# Patient Record
Sex: Male | Born: 1935
Health system: Southern US, Community
[De-identification: ages and names within clinical notes are randomized; demographics above are authoritative.]

## PROBLEM LIST (undated history)

## (undated) DIAGNOSIS — H3530 Unspecified macular degeneration: Secondary | ICD-10-CM

## (undated) DIAGNOSIS — K219 Gastro-esophageal reflux disease without esophagitis: Secondary | ICD-10-CM

## (undated) DIAGNOSIS — E669 Obesity, unspecified: Secondary | ICD-10-CM

## (undated) DIAGNOSIS — M1990 Unspecified osteoarthritis, unspecified site: Secondary | ICD-10-CM

## (undated) DIAGNOSIS — R195 Other fecal abnormalities: Secondary | ICD-10-CM

## (undated) DIAGNOSIS — E785 Hyperlipidemia, unspecified: Secondary | ICD-10-CM

## (undated) DIAGNOSIS — L309 Dermatitis, unspecified: Secondary | ICD-10-CM

## (undated) DIAGNOSIS — D509 Iron deficiency anemia, unspecified: Secondary | ICD-10-CM

## (undated) DIAGNOSIS — M159 Polyosteoarthritis, unspecified: Secondary | ICD-10-CM

## (undated) DIAGNOSIS — H3560 Retinal hemorrhage, unspecified eye: Secondary | ICD-10-CM

## (undated) DIAGNOSIS — N182 Chronic kidney disease, stage 2 (mild): Secondary | ICD-10-CM

## (undated) DIAGNOSIS — R270 Ataxia, unspecified: Secondary | ICD-10-CM

## (undated) DIAGNOSIS — G4700 Insomnia, unspecified: Secondary | ICD-10-CM

## (undated) DIAGNOSIS — IMO0002 Reserved for concepts with insufficient information to code with codable children: Secondary | ICD-10-CM

## (undated) DIAGNOSIS — D649 Anemia, unspecified: Secondary | ICD-10-CM

## (undated) DIAGNOSIS — I872 Venous insufficiency (chronic) (peripheral): Secondary | ICD-10-CM

## (undated) DIAGNOSIS — I10 Essential (primary) hypertension: Secondary | ICD-10-CM

## (undated) HISTORY — DX: Other fecal abnormalities: R19.5

## (undated) HISTORY — DX: Hyperlipidemia, unspecified: E78.5

## (undated) HISTORY — DX: Polyosteoarthritis, unspecified: M15.9

## (undated) HISTORY — PX: OTHER SURGICAL HISTORY: SHX169

## (undated) HISTORY — DX: Gastro-esophageal reflux disease without esophagitis: K21.9

## (undated) HISTORY — DX: Insomnia, unspecified: G47.00

## (undated) HISTORY — DX: Iron deficiency anemia, unspecified: D50.9

## (undated) HISTORY — DX: Anemia, unspecified: D64.9

## (undated) HISTORY — DX: Essential (primary) hypertension: I10

## (undated) HISTORY — DX: Retinal hemorrhage, unspecified eye: H35.60

## (undated) HISTORY — DX: Chronic kidney disease, stage 2 (mild): N18.2

## (undated) HISTORY — DX: Unspecified macular degeneration: H35.30

## (undated) HISTORY — DX: Venous insufficiency (chronic) (peripheral): I87.2

## (undated) HISTORY — PX: NASAL SINUS SURGERY: SHX719

## (undated) HISTORY — DX: Ataxia, unspecified: R27.0

## (undated) HISTORY — DX: Unspecified osteoarthritis, unspecified site: M19.90

## (undated) HISTORY — DX: Reserved for concepts with insufficient information to code with codable children: IMO0002

## (undated) HISTORY — DX: Obesity, unspecified: E66.9

## (undated) HISTORY — DX: Dermatitis, unspecified: L30.9

---
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
## 2010-10-02 ENCOUNTER — Ambulatory Visit
Admission: RE | Admit: 2010-10-02 | Discharge: 2010-10-02 | Payer: Self-pay | Source: Home / Self Care | Attending: Family Medicine | Admitting: Family Medicine

## 2010-10-02 DIAGNOSIS — I10 Essential (primary) hypertension: Secondary | ICD-10-CM | POA: Insufficient documentation

## 2010-10-02 DIAGNOSIS — E785 Hyperlipidemia, unspecified: Secondary | ICD-10-CM | POA: Insufficient documentation

## 2010-10-03 ENCOUNTER — Telehealth: Payer: Self-pay | Admitting: Family Medicine

## 2010-10-08 ENCOUNTER — Encounter: Payer: Self-pay | Admitting: Family Medicine

## 2010-10-08 ENCOUNTER — Telehealth (INDEPENDENT_AMBULATORY_CARE_PROVIDER_SITE_OTHER): Payer: Self-pay | Admitting: *Deleted

## 2010-10-08 LAB — CONVERTED CEMR LAB
ALT: 13 U/L
AST: 15 U/L
Albumin: 4.3 g/dL
Alkaline Phosphatase: 82 U/L
BUN: 24 mg/dL — ABNORMAL HIGH
Basophils Absolute: 0 K/uL
Basophils Relative: 0 %
CO2: 24 meq/L
Calcium: 9.8 mg/dL
Chloride: 102 meq/L
Cholesterol: 195 mg/dL
Creatinine, Ser: 0.9 mg/dL
Eosinophils Absolute: 0.3 K/uL
Eosinophils Relative: 4 %
Glucose, Bld: 90 mg/dL
HCT: 39.8 %
HDL: 55 mg/dL
Hemoglobin: 13.1 g/dL
LDL Cholesterol: 115 mg/dL — ABNORMAL HIGH
Lymphocytes Relative: 19 %
Lymphs Abs: 1.5 K/uL
MCHC: 32.9 g/dL
MCV: 97.3 fL
Monocytes Absolute: 0.8 K/uL
Monocytes Relative: 10 %
Neutro Abs: 5 K/uL
Neutrophils Relative %: 66 %
PSA: 1.45 ng/mL
Platelets: 352 K/uL
Potassium: 4.4 meq/L
RBC: 4.09 M/uL — ABNORMAL LOW
RDW: 14.5 %
Sodium: 142 meq/L
TSH: 2.29 u[IU]/mL
Total Bilirubin: 0.4 mg/dL
Total CHOL/HDL Ratio: 3.5
Total Protein: 6.7 g/dL
Triglycerides: 126 mg/dL
VLDL: 25 mg/dL
WBC: 7.6 10*3/microliter

## 2010-10-09 ENCOUNTER — Telehealth: Payer: Self-pay | Admitting: Family Medicine

## 2010-11-01 NOTE — Progress Notes (Signed)
Summary: Medical Record Request  Phone Note Other Incoming   Summary of Call: I forgot to get him to sign release of records authorization.  Please get him to come by at his earliest convenience to sign one and also get him to give the name of his most recent primary care doctor and any contact info he has so we can request old records.  Thanks. Initial call taken by: Michell Heinrich M.D.,  October 03, 2010 11:37 AM  Follow-up for Phone Call        LM for pt to [facility] Diane Tomerlin  October 03, 2010 4:40 PM  Additional Follow-up for Phone Call Additional follow up Details #1::        noted Additional Follow-up by: Michell Heinrich M.D.,  October 04, 2010 8:11 AM    Additional Follow-up for Phone Call Additional follow up Details #2::    Request signed 10/02/10 by patient Follow-up by: Lannette Donath,  October 22, 2010 1:00 PM

## 2010-11-01 NOTE — Progress Notes (Signed)
Summary: lab results  Phone Note Outgoing Call   Summary of Call: Please notify: all labs were great!  Continue all current meds. Initial call taken by: Michell Heinrich M.D.,  October 09, 2010 8:11 AM  Follow-up for Phone Call        message left on cell notifying of above.  also advised that refills had [facility] to [facility] and to call with any problems with those refills. Follow-up by: Francee Piccolo CMA Duncan Dull),  October 09, 2010 3:59 PM
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
## 2010-11-01 NOTE — Progress Notes (Signed)
Summary: Refill request  Phone Note Refill Request Message from:  Fax from Pharmacy on October 08, 2010 11:14 AM  Refills Requested: Medication #1:  OMEPRAZOLE 20 MG CPDR Take 1 capsule by mouth once a day   Dosage confirmed as above?Dosage Confirmed   Supply Requested: 3 months  Medication #2:  VERAPAMIL HCL CR 240 MG CR-TABS Take 1 tablet by mouth two times a day   Dosage confirmed as above?Dosage Confirmed   Supply Requested: 1 month  Medication #3:  PRAVASTATIN SODIUM 40 MG TABS Take 1 tablet by mouth once a day   Dosage confirmed as above?Dosage Confirmed   Supply Requested: 3 months  Method Requested: Electronic Initial call taken by: Francee Piccolo CMA Duncan Dull),  October 08, 2010 11:15 AM  Follow-up for Phone Call        RF all 3 as requested. Follow-up by: Michell Heinrich M.D.,  October 08, 2010 12:09 PM    Prescriptions: PRAVASTATIN SODIUM 40 MG TABS (PRAVASTATIN SODIUM) Take 1 tablet by mouth once a day  #90 x 0   Entered by:   Francee Piccolo CMA (AAMA)   Authorized by:   Michell Heinrich M.D.   Signed by:   Francee Piccolo CMA (AAMA) on 10/08/2010   Method used:   [id number]       [address]       [place], [place]  [address]       Ph: [phone] or [phone]       Fax: [phone]   RxID:   [id number] VERAPAMIL HCL CR 240 MG CR-TABS (VERAPAMIL HCL) Take 1 tablet by mouth two times a day  #60 x 0   Entered by:   Francee Piccolo CMA (AAMA)   Authorized by:   Michell Heinrich M.D.   Signed by:   Francee Piccolo CMA (AAMA) on 10/08/2010   Method used:   [facility]        [id number]       [address]       [place], [place]  [address]       Ph: [phone] or [phone]       Fax: [phone]   RxID:   [id number] OMEPRAZOLE 20 MG CPDR (OMEPRAZOLE) Take 1 capsule by mouth once a day  #90 x 3   Entered by:   Francee Piccolo CMA (AAMA)   Authorized by:   Michell Heinrich M.D.   Signed by:   Francee Piccolo CMA (AAMA) on 10/08/2010   Method used:   [facility]        [facility]  [address] [phone]       [address]       [place], [place]  [address]       Ph: [phone] or [phone]       Fax: [phone]   RxID:   [id number]

## 2010-11-15 ENCOUNTER — Encounter: Payer: Self-pay | Admitting: Family Medicine

## 2010-11-21 NOTE — Miscellaneous (Signed)
  Clinical Lists Changes  Observations: Added new observation of PAST MED HX: HTN Hyperlipidemia Osteoarthritis L-spine (spinal stenosis, lumbar radiculopathy 2010) and knees. GERD, with peptic stricture (dilatation in 1993 and 1994). Asymmetric sensorineural hearing loss (noise exposure, R worse than L). Plantar fasciitis, right. (11/15/2010 16:14) Added new observation of DM PROGRESS: N/A (11/15/2010 16:14) Added new observation of DM FSREVIEW: N/A (11/15/2010 16:14) Added new observation of TD BOOSTER: Historical (10/01/2007 16:37) Added new observation of ZOSTAVAX: Zostavax (10/01/2007 16:37) Added new observation of COLONOSCOPY: normal (09/30/2000 16:25) Added new observation of PNEUMOVAX: given (09/30/1996 16:18)        Preventive Care Screening  Colonoscopy:    Date:  09/30/2000    Results:  normal  Last Pneumovax:    Date:  09/30/1996    Results:  given   Past History:  Past Medical History: HTN Hyperlipidemia Osteoarthritis L-spine (spinal stenosis, lumbar radiculopathy 2010) and knees. GERD, with peptic stricture (dilatation in 1993 and 1994). Asymmetric sensorineural hearing loss (noise exposure, R worse than L). Plantar fasciitis, right.   Prevention & Chronic Care Immunizations   Influenza vaccine: Not documented    Tetanus booster: 10/01/2007: Historical    Pneumococcal vaccine: given  (09/30/1996)    H. zoster vaccine: 10/01/2007: Zostavax  Colorectal Screening   Hemoccult: Not documented    Colonoscopy: normal  (09/30/2000)  Other Screening   PSA: 1.45  (10/08/2010)   Smoking status: never  (10/02/2010)  Lipids   Total Cholesterol: 195  (10/08/2010)   LDL: 115  (10/08/2010)   LDL Direct: Not documented   HDL: 55  (10/08/2010)   Triglycerides: 126  (10/08/2010)    SGOT (AST): 15  (10/08/2010)   SGPT (ALT): 13  (10/08/2010)   Alkaline phosphatase: 82  (10/08/2010)   Total bilirubin: 0.4  (10/08/2010)  Hypertension   Last  Blood Pressure: 123 / 68  (10/02/2010)   Serum creatinine: 0.90  (10/08/2010)   Serum potassium 4.4  (10/08/2010)  Self-Management Support :    Hypertension self-management support: Not documented    Lipid self-management support: Not documented    Immunization History:  Zostavax History:    Zostavax # 1:  zostavax (10/01/2007)  Tetanus/Td Immunization History:    Tetanus/Td:  historical (10/01/2007)

## 2010-12-06 ENCOUNTER — Encounter: Payer: Self-pay | Admitting: Family Medicine

## 2010-12-11 ENCOUNTER — Telehealth: Payer: Self-pay | Admitting: Family Medicine

## 2010-12-18 NOTE — Progress Notes (Signed)
Summary: Verapamil refill  Phone Note Refill Request Message from:  Fax from Pharmacy on December 11, 2010 9:35 AM  Refills Requested: Medication #1:  VERAPAMIL HCL CR 240 MG CR-TABS Take 1 tablet by mouth two times a day   Dosage confirmed as above?Dosage Confirmed   Brand Name Necessary? No   Supply Requested: 1 month  Method Requested: Electronic Initial call taken by: Lannette Donath,  December 11, 2010 9:35 AM  Follow-up for Phone Call        May RF x 4. Follow-up by: Michell Heinrich M.D.,  December 11, 2010 10:12 AM  Additional Follow-up for Phone Call Additional follow up Details #1::        rx sent. Additional Follow-up by: Francee Piccolo CMA Duncan Dull),  December 11, 2010 11:02 AM    Prescriptions: VERAPAMIL HCL CR 240 MG CR-TABS (VERAPAMIL HCL) Take 1 tablet by mouth two times a day  #60 x 3   Entered by:   Francee Piccolo CMA (AAMA)   Authorized by:   Michell Heinrich M.D.   Signed by:   Francee Piccolo CMA (AAMA) on 12/11/2010   Method used:   [facility]  [address] [phone][facility]       [address]       [place], [place]  [address]       Ph: [phone] or [phone]       Fax: [phone]   RxID:   [id number]

## 2011-01-03 ENCOUNTER — Telehealth: Payer: Self-pay | Admitting: Family Medicine

## 2011-01-03 NOTE — Telephone Encounter (Signed)
Authorized fax request for RF of pravastatin today, #90, RF x 4. Pt. Due for routine f/u approx 03/2011.--PM

## 2011-02-04 ENCOUNTER — Other Ambulatory Visit: Payer: Self-pay | Admitting: *Deleted

## 2011-02-04 DIAGNOSIS — I10 Essential (primary) hypertension: Secondary | ICD-10-CM

## 2011-02-04 MED ORDER — LOSARTAN POTASSIUM-HCTZ 100-12.5 MG PO TABS: 1.0000 | ORAL_TABLET | Freq: Every day | ORAL | Status: DC

## 2011-02-04 NOTE — Telephone Encounter (Signed)
Pt presents at office requesting refill on losartan.  Refill sent.  Pt due for follow up appt in 03/2011.

## 2011-03-12 ENCOUNTER — Ambulatory Visit (INDEPENDENT_AMBULATORY_CARE_PROVIDER_SITE_OTHER): Payer: PRIVATE HEALTH INSURANCE | Admitting: Family Medicine

## 2011-03-12 ENCOUNTER — Encounter: Payer: Self-pay | Admitting: Family Medicine

## 2011-03-12 DIAGNOSIS — E785 Hyperlipidemia, unspecified: Secondary | ICD-10-CM

## 2011-03-12 DIAGNOSIS — Z0000 Encounter for general adult medical examination without abnormal findings: Secondary | ICD-10-CM

## 2011-03-12 DIAGNOSIS — L989 Disorder of the skin and subcutaneous tissue, unspecified: Secondary | ICD-10-CM

## 2011-03-12 DIAGNOSIS — I10 Essential (primary) hypertension: Secondary | ICD-10-CM

## 2011-03-12 LAB — COMPREHENSIVE METABOLIC PANEL
AST: 18 U/L (ref 0–37)
Albumin: 4.4 g/dL (ref 3.5–5.2)
BUN: 22 mg/dL (ref 6–23)
Calcium: 9 mg/dL (ref 8.4–10.5)
Chloride: 102 mEq/L (ref 96–112)
Glucose, Bld: 79 mg/dL (ref 70–99)
Potassium: 4.5 mEq/L (ref 3.5–5.1)
Total Protein: 7.6 g/dL (ref 6.0–8.3)

## 2011-03-12 LAB — LIPID PANEL
Total CHOL/HDL Ratio: 3
Triglycerides: 124 mg/dL (ref 0.0–149.0)

## 2011-03-12 NOTE — Assessment & Plan Note (Signed)
Check FLP, CMET today. Encouraged him to be more physically active.  He'll try NSAIDs for knees/thumbs since it has worked excellently for his back. He has declined further screening for colon cancer.  PSA 97mo ago was normal.
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
## 2011-03-12 NOTE — Assessment & Plan Note (Signed)
Problem stable.  Continue current medications and diet appropriate for this condition.  We have reviewed our general long term plan for this problem and also reviewed symptoms and signs that should prompt the patient to call or return to the office. Check FLP today. 

## 2011-03-12 NOTE — Assessment & Plan Note (Signed)
Problem stable.  Continue current medications and diet appropriate for this condition.  We have reviewed our general long term plan for this problem and also reviewed symptoms and signs that should prompt the patient to call or return to the office.  

## 2011-04-01 ENCOUNTER — Other Ambulatory Visit: Payer: Self-pay | Admitting: *Deleted

## 2011-04-01 DIAGNOSIS — I10 Essential (primary) hypertension: Secondary | ICD-10-CM

## 2011-04-01 MED ORDER — LOSARTAN POTASSIUM-HCTZ 100-12.5 MG PO TABS: 1.0000 | ORAL_TABLET | Freq: Every day | ORAL | Status: DC

## 2011-04-01 NOTE — Telephone Encounter (Signed)
Faxed refill request received.  Pt last seen 03/2011 and told to continue all meds.  Pt should follow up in 08/2011.  RX through December sent.

## 2011-04-15 ENCOUNTER — Other Ambulatory Visit: Payer: Self-pay

## 2011-04-15 MED ORDER — VERAPAMIL HCL 240 MG PO TBCR: 240.0000 mg | EXTENDED_RELEASE_TABLET | Freq: Two times a day (BID) | ORAL | Status: DC

## 2011-05-15 ENCOUNTER — Other Ambulatory Visit: Payer: Self-pay | Admitting: Family Medicine

## 2011-05-15 NOTE — Telephone Encounter (Signed)
Last OV 03/12/11, next office visit due 08/2011.  RX sent through that time.

## 2011-07-04 ENCOUNTER — Ambulatory Visit (INDEPENDENT_AMBULATORY_CARE_PROVIDER_SITE_OTHER): Payer: PRIVATE HEALTH INSURANCE

## 2011-07-04 DIAGNOSIS — Z23 Encounter for immunization: Secondary | ICD-10-CM

## 2011-09-11 ENCOUNTER — Ambulatory Visit (INDEPENDENT_AMBULATORY_CARE_PROVIDER_SITE_OTHER): Payer: Medicare Other | Admitting: Family Medicine

## 2011-09-11 ENCOUNTER — Encounter: Payer: Self-pay | Admitting: Family Medicine

## 2011-09-11 VITALS — BP 118/72 | HR 54 | Temp 97.2°F | Wt 210.0 lb

## 2011-09-11 DIAGNOSIS — I10 Essential (primary) hypertension: Secondary | ICD-10-CM

## 2011-09-11 DIAGNOSIS — M2350 Chronic instability of knee, unspecified knee: Secondary | ICD-10-CM

## 2011-09-11 DIAGNOSIS — M238X9 Other internal derangements of unspecified knee: Secondary | ICD-10-CM

## 2011-09-11 DIAGNOSIS — E785 Hyperlipidemia, unspecified: Secondary | ICD-10-CM

## 2011-09-11 LAB — COMPREHENSIVE METABOLIC PANEL
AST: 19 U/L (ref 0–37)
Alkaline Phosphatase: 104 U/L (ref 39–117)
BUN: 20 mg/dL (ref 6–23)
Creatinine, Ser: 1 mg/dL (ref 0.4–1.5)
Potassium: 4.4 mEq/L (ref 3.5–5.1)
Total Bilirubin: 0.7 mg/dL (ref 0.3–1.2)

## 2011-09-11 LAB — LIPID PANEL
HDL: 56.5 mg/dL (ref >39.00)
Triglycerides: 182 mg/dL — ABNORMAL HIGH (ref 0.0–149.0)
VLDL: 36.4 mg/dL (ref 0.0–40.0)

## 2011-09-11 NOTE — Progress Notes (Signed)
OFFICE NOTE  09/12/2011  CC:  Chief Complaint  Patient presents with  . Follow-up    6 month follow up     HPI:   Patient is a 77 y.o. Caucasian male who is here for 31mo f/u HTN, hyperlipidemia, GERD. Compliant with meds.  No side effects. Only c/o he has is a feeling he's had for years of feeling "wobbly" when he walks--feels like the instability is focused in his knees.  No pain in knees. No numbness, tingling, or burning pain in LE's. NO headaches, dizziness, or tremulousness.  Unfortunately, his wife passed away from brain cancer about 2 wks ago.  He seems to be grieving appropriately, although he is definitely on the stoic side. Has son and daughter in law for support locally.  Pertinent PMH:  Past Medical History  Diagnosis Date  . Hypertension   . Hyperlipidemia   . GERD (gastroesophageal reflux disease)   . DDD (degenerative disc disease)     L-spine   Past Surgical History  Procedure Date  . Appendectomy 1935  . Tonsillectomy 1935   Past family and social history reviewed and the only new issue is his wife died 09-06-11 of brain cancer.  MEDS;   Outpatient Prescriptions Prior to Visit  Medication Sig Dispense Refill  . aspirin 81 MG tablet Take 81 mg by mouth daily.        . Cholecalciferol (VITAMIN D3) 2000 UNITS TABS Take by mouth daily.        Marland Kitchen losartan-hydrochlorothiazide (HYZAAR) 100-12.5 MG per tablet Take 1 tablet by mouth daily.  30 tablet  5  . multivitamin (THERAGRAN) per tablet Take 1 tablet by mouth daily.        . naproxen sodium (ANAPROX) 220 MG tablet Take 220 mg by mouth daily as needed.        Marland Kitchen omeprazole (PRILOSEC) 20 MG capsule Take 20 mg by mouth daily.        . pravastatin (PRAVACHOL) 40 MG tablet Take 40 mg by mouth daily.        . verapamil (CALAN-SR) 240 MG CR tablet TAKE 1 TABLET BY MOUTH 2 TIMES A DAY  60 tablet  4  . Glucosamine-Chondroitin-Vit D3 1500-1200-800 MG-MG-UNIT PACK Take 1 tablet by mouth daily.          PE: Blood  pressure 118/72, pulse 54, temperature 97.2 F (36.2 C), temperature source Temporal, weight 210 lb (95.255 kg), SpO2 96.00%. Gen: Alert, well appearing.  Patient is oriented to person, place, time, and situation. ENT:  Eyes: no injection, icteris, swelling, or exudate.  EOMI, PERRLA. Nose: no drainage or turbinate edema/swelling.  No injection or focal lesion.  Mouth: lips without lesion/swelling.  Oral mucosa pink and moist.  Oropharynx without erythema, exudate, or swelling.  Neck - No masses or thyromegaly or limitation in range of motion.  No bruit.  Carotids 1+ bilat. CV: RRR, no m/r/g.   LUNGS: CTA bilat, nonlabored resps, good aeration in all lung fields. EXT: 1+ edema bilat, with extensive hyperkeratosis and bronzing from chronic venous stasis edema/dermatitis.  No erythema or tenderness. Neuro: CN 2-12 intact bilaterally, strength 5/5 in proximal and distal upper extremities and lower extremities bilaterally.  No sensory deficits.  No tremor.  No disdiadochokinesis.  No ataxia.  Upper extremity and lower extremity DTRs symmetric.  No pronator drift. He walks normally but occasionally looks a little off balance and has to correct himself.  IMPRESSION AND PLAN:  HYPERLIPIDEMIA Problem stable.  Continue current medications  and diet appropriate for this condition.  We have reviewed our general long term plan for this problem and also reviewed symptoms and signs that should prompt the patient to call or return to the office. FLP today.  ESSENTIAL HYPERTENSION, BENIGN Problem stable.  Continue current medications and diet appropriate for this condition.  We have reviewed our general long term plan for this problem and also reviewed symptoms and signs that should prompt the patient to call or return to the office. CMET today.  Chronic knee instability Could be ligamentous or tendonous laxity that is giving him this feeling of instability in knees when he walks. No sign of neurologic  dysfunction.   He has been to Dr. Corinna Capra, an orthopedist in [place] for trigger finger in the past.  He plans on going back to him for trigger finger and to get knees assessed.   GERD: stable as long as he takes PPI daily.  Grief rxn: lost his wife to brain cancer last month.  Seems to be doing ok with this. Encouraged pt to call or return if he needed [place].  FOLLOW UP:  Return in about 6 months (around 03/11/2012) for CPE (30 min appt).

## 2011-09-12 DIAGNOSIS — M2350 Chronic instability of knee, unspecified knee: Secondary | ICD-10-CM | POA: Insufficient documentation

## 2011-09-12 LAB — LDL CHOLESTEROL, DIRECT: Direct LDL: 112.5 mg/dL

## 2011-09-12 NOTE — Assessment & Plan Note (Addendum)
Problem stable.  Continue current medications and diet appropriate for this condition.  We have reviewed our general long term plan for this problem and also reviewed symptoms and signs that should prompt the patient to call or return to the office. FLP today.

## 2011-09-12 NOTE — Assessment & Plan Note (Signed)
Could be ligamentous or tendonous laxity that is giving him this feeling of instability in knees when he walks. No sign of neurologic dysfunction.

## 2011-09-12 NOTE — Assessment & Plan Note (Signed)
Problem stable.  Continue current medications and diet appropriate for this condition.  We have reviewed our general long term plan for this problem and also reviewed symptoms and signs that should prompt the patient to call or return to the office. CMET today. 

## 2011-09-13 ENCOUNTER — Other Ambulatory Visit: Payer: Self-pay | Admitting: Family Medicine

## 2011-09-13 MED ORDER — PRAVASTATIN SODIUM 80 MG PO TABS: 80.0000 mg | ORAL_TABLET | Freq: Every day | ORAL | Status: DC

## 2011-09-13 NOTE — Progress Notes (Signed)
I can't find the 30mg  generic pravastatin in [facility], so I want him to take two of his 40mg  pravastatins daily until he runs out.  Then pick up new rx for 80mg  tab that he'll take once daily. Thx

## 2011-09-16 NOTE — Progress Notes (Signed)
i tried to call [facility] on his cell, but went to voicemail.  Daughter-in-law Rene Kocher called with information.  She will give [facility] message and make sure he is taking medication correctly.

## 2011-10-02 ENCOUNTER — Other Ambulatory Visit: Payer: Self-pay | Admitting: *Deleted

## 2011-10-02 DIAGNOSIS — I10 Essential (primary) hypertension: Secondary | ICD-10-CM

## 2011-10-02 MED ORDER — LOSARTAN POTASSIUM-HCTZ 100-12.5 MG PO TABS: 1.0000 | ORAL_TABLET | Freq: Every day | ORAL | Status: DC

## 2011-10-02 NOTE — Telephone Encounter (Signed)
Last seen 09/11/11.  Pt has follow up-CPE- on 03/10/12.  RX sent through that time.

## 2011-10-29 ENCOUNTER — Other Ambulatory Visit: Payer: Self-pay | Admitting: *Deleted

## 2011-10-29 MED ORDER — VERAPAMIL HCL ER 240 MG PO TBCR: 240.0000 mg | EXTENDED_RELEASE_TABLET | Freq: Two times a day (BID) | ORAL | Status: DC

## 2011-11-19 ENCOUNTER — Other Ambulatory Visit: Payer: Self-pay | Admitting: *Deleted

## 2011-11-19 MED ORDER — OMEPRAZOLE 20 MG PO CPDR: 20.0000 mg | DELAYED_RELEASE_CAPSULE | Freq: Every day | ORAL | Status: DC

## 2011-11-19 NOTE — Telephone Encounter (Signed)
Pt last seen 12/12.  RX sent until 12/13.

## 2011-12-19 DIAGNOSIS — Z7982 Long term (current) use of aspirin: Secondary | ICD-10-CM | POA: Diagnosis not present

## 2011-12-19 DIAGNOSIS — M6530 Trigger finger, unspecified finger: Secondary | ICD-10-CM | POA: Diagnosis not present

## 2011-12-19 DIAGNOSIS — Z79899 Other long term (current) drug therapy: Secondary | ICD-10-CM | POA: Diagnosis not present

## 2011-12-19 DIAGNOSIS — E7800 Pure hypercholesterolemia, unspecified: Secondary | ICD-10-CM | POA: Diagnosis not present

## 2011-12-19 DIAGNOSIS — K219 Gastro-esophageal reflux disease without esophagitis: Secondary | ICD-10-CM | POA: Diagnosis not present

## 2011-12-19 DIAGNOSIS — I10 Essential (primary) hypertension: Secondary | ICD-10-CM | POA: Diagnosis not present

## 2012-01-02 ENCOUNTER — Other Ambulatory Visit: Payer: Self-pay | Admitting: *Deleted

## 2012-01-02 DIAGNOSIS — I10 Essential (primary) hypertension: Secondary | ICD-10-CM

## 2012-01-02 MED ORDER — VERAPAMIL HCL ER 240 MG PO TBCR: 240.0000 mg | EXTENDED_RELEASE_TABLET | Freq: Two times a day (BID) | ORAL | Status: DC

## 2012-01-02 MED ORDER — OMEPRAZOLE 20 MG PO CPDR: 20.0000 mg | DELAYED_RELEASE_CAPSULE | Freq: Every day | ORAL | Status: DC

## 2012-01-02 MED ORDER — LOSARTAN POTASSIUM-HCTZ 100-12.5 MG PO TABS: 1.0000 | ORAL_TABLET | Freq: Every day | ORAL | Status: DC

## 2012-01-02 MED ORDER — PRAVASTATIN SODIUM 80 MG PO TABS: 80.0000 mg | ORAL_TABLET | Freq: Every day | ORAL | Status: DC

## 2012-01-02 NOTE — Telephone Encounter (Signed)
Faxed refill request received from pharmacy for pravastatin Last filled by MD on 09/12/12  Faxed refill request received from pharmacy for losartan-HCTZ Last filled by MD on 10/02/11, 30 x 6  Faxed refill request received from pharmacy for verapamil Last filled by MD on 10/29/11, 60 x 4   Faxed refill request received from pharmacy for omeprazole Last filled by MD on 11/19/11, 30 x 10  Last seen on 09/11/11 Follow up 03/10/12 for CPE RX sent.

## 2012-01-24 DIAGNOSIS — M4802 Spinal stenosis, cervical region: Secondary | ICD-10-CM | POA: Diagnosis not present

## 2012-02-29 DIAGNOSIS — R195 Other fecal abnormalities: Secondary | ICD-10-CM

## 2012-02-29 HISTORY — DX: Other fecal abnormalities: R19.5

## 2012-03-02 DIAGNOSIS — M1710 Unilateral primary osteoarthritis, unspecified knee: Secondary | ICD-10-CM | POA: Diagnosis not present

## 2012-03-02 DIAGNOSIS — M25569 Pain in unspecified knee: Secondary | ICD-10-CM | POA: Diagnosis not present

## 2012-03-10 ENCOUNTER — Ambulatory Visit (INDEPENDENT_AMBULATORY_CARE_PROVIDER_SITE_OTHER): Payer: Medicare Other | Admitting: Family Medicine

## 2012-03-10 ENCOUNTER — Encounter: Payer: Self-pay | Admitting: Family Medicine

## 2012-03-10 VITALS — BP 157/78 | HR 59 | Temp 97.2°F | Ht 69.25 in | Wt 210.0 lb

## 2012-03-10 DIAGNOSIS — I10 Essential (primary) hypertension: Secondary | ICD-10-CM | POA: Diagnosis not present

## 2012-03-10 DIAGNOSIS — M2350 Chronic instability of knee, unspecified knee: Secondary | ICD-10-CM

## 2012-03-10 DIAGNOSIS — M238X9 Other internal derangements of unspecified knee: Secondary | ICD-10-CM

## 2012-03-10 DIAGNOSIS — Z0000 Encounter for general adult medical examination without abnormal findings: Secondary | ICD-10-CM

## 2012-03-10 LAB — CBC WITH DIFFERENTIAL/PLATELET
Basophils Relative: 0.7 % (ref 0.0–3.0)
Eosinophils Relative: 5.8 % — ABNORMAL HIGH (ref 0.0–5.0)
Hemoglobin: 12.7 g/dL — ABNORMAL LOW (ref 13.0–17.0)
Lymphocytes Relative: 19.7 % (ref 12.0–46.0)
MCV: 96.8 fl (ref 78.0–100.0)
Neutro Abs: 4.2 10*3/uL (ref 1.4–7.7)
Neutrophils Relative %: 62.9 % (ref 43.0–77.0)
RBC: 4.01 Mil/uL — ABNORMAL LOW (ref 4.22–5.81)
WBC: 6.7 10*3/uL (ref 4.5–10.5)

## 2012-03-10 LAB — COMPREHENSIVE METABOLIC PANEL
AST: 18 U/L (ref 0–37)
Alkaline Phosphatase: 97 U/L (ref 39–117)
BUN: 18 mg/dL (ref 6–23)
Calcium: 9.1 mg/dL (ref 8.4–10.5)
Creatinine, Ser: 0.9 mg/dL (ref 0.4–1.5)
Total Bilirubin: 0.1 mg/dL — ABNORMAL LOW (ref 0.3–1.2)

## 2012-03-10 LAB — LIPID PANEL
Cholesterol: 157 mg/dL (ref 0–200)
HDL: 54.4 mg/dL (ref >39.00)
LDL Cholesterol: 80 mg/dL (ref 0–99)
Triglycerides: 114 mg/dL (ref 0.0–149.0)
VLDL: 22.8 mg/dL (ref 0.0–40.0)

## 2012-03-10 MED ORDER — LOSARTAN POTASSIUM-HCTZ 100-25 MG PO TABS: 1.0000 | ORAL_TABLET | Freq: Every day | ORAL | Status: DC

## 2012-03-10 NOTE — Progress Notes (Signed)
Office Note 03/10/2012  CC:  Chief Complaint  Patient presents with  . Annual Exam    left knee pain    HPI:  Glen Barnett is a 78 y.o. White male who is here for routine health maintenance exam. No acute complaints.  Says home bp monitoring show mild elevation of systolic consistently, diastolic normal.  Has some intermittent low back, hands, and knees arthralgias, occ takes naproxen, went to ortho recently and was told x-rays showed DJD. He gets regular dental visits. He has no vision complaints but has appt set up for routine eye exam soon. Denies having any significant excessive grieving or depression since his wife died of cancer last year.   Past Medical History  Diagnosis Date  . Hypertension   . Hyperlipidemia   . GERD (gastroesophageal reflux disease)   . DDD (degenerative disc disease)     L-spine    Past Surgical History  Procedure Date  . Appendectomy 1935  . Tonsillectomy 1935    Family History  Problem Relation Age of Onset  . Heart disease Neg Hx     History   Social History  . Marital Status: Widowed    Spouse Name: N/A    Number of Children: N/A  . Years of Education: N/A   Occupational History  . Not on file.   Social History Main Topics  . Smoking status: Never Smoker   . Smokeless tobacco: Never Used  . Alcohol Use: No  . Drug Use: No  . Sexually Active: Not on file   Other Topics Concern  . Not on file   Social History Narrative   Was married.  Wife died September 12, 2011 of brain cancer.He relocated from [place] 2010 to live near son.NO exercise.  No T/A/Ds.Retired Art gallery manager.    Outpatient Prescriptions Prior to Visit  Medication Sig Dispense Refill  . aspirin 81 MG tablet Take 81 mg by mouth daily.        . Cholecalciferol (VITAMIN D3) 2000 UNITS TABS Take by mouth daily.        . multivitamin (THERAGRAN) per tablet Take 1 tablet by mouth daily.        . naproxen sodium (ANAPROX) 220 MG tablet Take 220 mg by mouth daily as  needed.        Marland Kitchen omeprazole (PRILOSEC) 20 MG capsule Take 1 capsule (20 mg total) by mouth daily.  90 capsule  3  . pravastatin (PRAVACHOL) 80 MG tablet Take 1 tablet (80 mg total) by mouth daily.  90 tablet  0  . verapamil (CALAN-SR) 240 MG CR tablet Take 1 tablet (240 mg total) by mouth 2 (two) times daily.  180 tablet  0  . losartan-hydrochlorothiazide (HYZAAR) 100-12.5 MG per tablet Take 1 tablet by mouth daily.  90 tablet  0    No Known Allergies  ROS Review of Systems  Constitutional: Negative for fever, chills, appetite change and fatigue.  HENT: Negative for ear pain, congestion, sore throat, neck stiffness and dental problem.   Eyes: Negative for discharge, redness and visual disturbance.  Respiratory: Negative for cough, chest tightness, shortness of breath and wheezing.   Cardiovascular: Negative for chest pain, palpitations and leg swelling.  Gastrointestinal: Negative for nausea, vomiting, abdominal pain, diarrhea and blood in stool.  Genitourinary: Negative for dysuria, urgency, frequency, hematuria, flank pain and difficulty urinating.  Musculoskeletal: Positive for arthralgias (see HPI). Negative for myalgias, back pain and joint swelling.  Skin: Negative for pallor and rash.  Neurological: Negative for  dizziness, speech difficulty, weakness and headaches.  Hematological: Negative for adenopathy. Does not bruise/bleed easily.  Psychiatric/Behavioral: Negative for confusion and sleep disturbance. The patient is not nervous/anxious.     PE; Blood pressure 157/78, pulse 59, temperature 97.2 F (36.2 C), temperature source Temporal, height 5' 9.25" (1.759 m), weight 210 lb (95.255 kg), SpO2 95.00%. Gen: Alert, well appearing.  Patient is oriented to person, place, time, and situation. Affect: pleasant.  Thought and speech are lucid. ENT: Ears: EACs clear, normal epithelium.  TMs with good light reflex and landmarks bilaterally.  Eyes: no injection, icteris, swelling, or  exudate.  EOMI, PERRLA. Nose: no drainage or turbinate edema/swelling.  No injection or focal lesion.  Mouth: lips without lesion/swelling.  Oral mucosa pink and moist.  Dentition intact and without obvious caries or gingival swelling.  Oropharynx without erythema, exudate, or swelling.  Neck: supple/nontender.  No LAD, mass, or TM.  Carotid pulses 2+ bilaterally, without bruits. CV: RRR, no m/r/g.   LUNGS: CTA bilat, nonlabored resps, good aeration in all lung fields. ABD: soft, NT, ND, BS normal.  No hepatospenomegaly or mass.  No bruits. EXT: no clubbing, cyanosis, or edema.  Neuro: CN 2-12 intact bilaterally, strength 5/5 in proximal and distal upper extremities and lower extremities bilaterally.  No sensory deficits.  No tremor.  No disdiadochokinesis.  No ataxia.  Upper extremity and lower extremity DTRs symmetric.  No pronator drift.  Pertinent labs:  None today  ASSESSMENT AND PLAN:   Health maintenance examination Reviewed age and gender appropriate health maintenance issues (prudent diet, regular exercise, health risks of tobacco and excessive alcohol, use of seatbelts, fire alarms in home, use of sunscreen).  Also reviewed age and gender appropriate health screening as well as vaccine recommendations. We discussed colon cancer screening and prostate cancer screening and decided not to pursue these anymore due to pt age. CBC, CMET, TSH, and FLP. Encouraged him to be more active, but pt is perfectly happy being sedentary. Encouraged him to call or return if he feels like depression is becoming an issue regarding the death of his wife 2012/09/25.  Chronic knee instability Being appropriately addressed by ortho, starts PT tomorrow. At this point he's not taking NSAIDs much, but if he does I told him I prefer him to be on celebrex rather than ibup or naproxen.  ESSENTIAL HYPERTENSION, BENIGN Not ideal control. He'll finish his current bottle of hyzaar 100/12.5 and then fill the rx I  sent to pharmacy today for hyzaar 100/25. Continue to monitor home bp.  Reviewed goal of 140/90.     FOLLOW UP:  Return in about 2 months (around 05/10/2012) for f/u HTN.

## 2012-03-10 NOTE — Assessment & Plan Note (Signed)
Reviewed age and gender appropriate health maintenance issues (prudent diet, regular exercise, health risks of tobacco and excessive alcohol, use of seatbelts, fire alarms in home, use of sunscreen).  Also reviewed age and gender appropriate health screening as well as vaccine recommendations. We discussed colon cancer screening and prostate cancer screening and decided not to pursue these anymore due to pt age. CBC, CMET, TSH, and FLP. Encouraged him to be more active, but pt is perfectly happy being sedentary. Encouraged him to call or return if he feels like depression is becoming an issue regarding the death of his wife September 22, 2012.

## 2012-03-10 NOTE — Assessment & Plan Note (Addendum)
Being appropriately addressed by ortho, starts PT tomorrow. At this point he's not taking NSAIDs much, but if he does I told him I prefer him to be on celebrex rather than ibup or naproxen.

## 2012-03-10 NOTE — Assessment & Plan Note (Signed)
Not ideal control. He'll finish his current bottle of hyzaar 100/12.5 and then fill the rx I sent to pharmacy today for hyzaar 100/25. Continue to monitor home bp.  Reviewed goal of 140/90.

## 2012-03-11 ENCOUNTER — Other Ambulatory Visit: Payer: Self-pay | Admitting: Family Medicine

## 2012-03-11 DIAGNOSIS — D509 Iron deficiency anemia, unspecified: Secondary | ICD-10-CM

## 2012-03-11 DIAGNOSIS — M25569 Pain in unspecified knee: Secondary | ICD-10-CM | POA: Diagnosis not present

## 2012-03-11 LAB — VITAMIN B12: Vitamin B-12: 367 pg/mL (ref 211–911)

## 2012-03-11 LAB — IBC PANEL
Iron: 70 ug/dL (ref 42–165)
Transferrin: 297.3 mg/dL (ref 212.0–360.0)

## 2012-03-13 ENCOUNTER — Encounter: Payer: Self-pay | Admitting: Gastroenterology

## 2012-03-13 ENCOUNTER — Encounter: Payer: Self-pay | Admitting: Family Medicine

## 2012-03-24 ENCOUNTER — Other Ambulatory Visit: Payer: Medicare Other

## 2012-03-24 LAB — HEMOCCULT SLIDES (X 3 CARDS)
OCCULT 1: POSITIVE
OCCULT 5: NEGATIVE

## 2012-03-25 DIAGNOSIS — M25569 Pain in unspecified knee: Secondary | ICD-10-CM | POA: Diagnosis not present

## 2012-04-03 ENCOUNTER — Encounter: Payer: Self-pay | Admitting: Gastroenterology

## 2012-04-03 ENCOUNTER — Ambulatory Visit (INDEPENDENT_AMBULATORY_CARE_PROVIDER_SITE_OTHER): Payer: Medicare Other | Admitting: Gastroenterology

## 2012-04-03 VITALS — BP 142/82 | HR 86 | Ht 69.0 in | Wt 212.0 lb

## 2012-04-03 DIAGNOSIS — R195 Other fecal abnormalities: Secondary | ICD-10-CM | POA: Diagnosis not present

## 2012-04-03 DIAGNOSIS — D649 Anemia, unspecified: Secondary | ICD-10-CM | POA: Diagnosis not present

## 2012-04-03 MED ORDER — MOVIPREP 100 G PO SOLR: 1.0000 | ORAL | Status: DC

## 2012-04-03 NOTE — Progress Notes (Signed)
HPI: This is a   very pleasant 78 year old man whom I am meeting for the first time today.  He never sees overt bleeding,  No bowel  Issues, changes.  Colonoscopy 15 years ago, normal.  No colon cancer in family.  No abd pains.  Overall stable weight.  Hb was 12.8, MCV normal.  Ferritin slightly low.   Heme + 3 out of 6.    Review of systems: Pertinent positive and negative review of systems were noted in the above HPI section. Complete review of systems was performed and was otherwise normal.    Past Medical History  Diagnosis Date  . Hypertension   . Hyperlipidemia   . GERD (gastroesophageal reflux disease)   . DDD (degenerative disc disease)     L-spine  . Arthritis     Past Surgical History  Procedure Date  . Appendectomy 1935  . Tonsillectomy 1935    Current Outpatient Prescriptions  Medication Sig Dispense Refill  . aspirin 81 MG tablet Take 81 mg by mouth daily.        . Cholecalciferol (VITAMIN D3) 2000 UNITS TABS Take by mouth daily.        . ferrous sulfate (QC FERROUS SULFATE) 325 (65 FE) MG tablet Take 325 mg by mouth daily with breakfast.      . losartan-hydrochlorothiazide (HYZAAR) 100-25 MG per tablet Take 1 tablet by mouth daily.  30 tablet  1  . multivitamin (THERAGRAN) per tablet Take 1 tablet by mouth daily.        . naproxen sodium (ANAPROX) 220 MG tablet Take 220 mg by mouth daily as needed.        Marland Kitchen omeprazole (PRILOSEC) 20 MG capsule Take 1 capsule (20 mg total) by mouth daily.  90 capsule  3  . pravastatin (PRAVACHOL) 80 MG tablet Take 1 tablet (80 mg total) by mouth daily.  90 tablet  0  . verapamil (CALAN-SR) 240 MG CR tablet Take 1 tablet (240 mg total) by mouth 2 (two) times daily.  180 tablet  0    Allergies as of 04/03/2012  . (No Known Allergies)    Family History  Problem Relation Age of Onset  . Liver cancer Mother     History   Social History  . Marital Status: Widowed    Spouse Name: N/A    Number of Children: N/A  .  Years of Education: N/A   Occupational History  . Not on file.   Social History Main Topics  . Smoking status: Never Smoker   . Smokeless tobacco: Never Used  . Alcohol Use: No  . Drug Use: No  . Sexually Active: Not on file   Other Topics Concern  . Not on file   Social History Narrative   Was married.  Wife died 09-28-11 of brain cancer.He relocated from [place] 2010 to live near son.NO exercise.  No T/A/Ds.Retired Art gallery manager.       Physical Exam: BP 142/82  Pulse 86  Ht 5\' 9"  (1.753 m)  Wt 212 lb (96.163 kg)  BMI 31.31 kg/m2  SpO2 96% Constitutional: generally well-appearing Psychiatric: alert and oriented x3 Eyes: extraocular movements intact Mouth: oral pharynx moist, no lesions Neck: supple no lymphadenopathy Cardiovascular: heart regular rate and rhythm Lungs: clear to auscultation bilaterally Abdomen: soft, nontender, nondistended, no obvious ascites, no peritoneal signs, normal bowel sounds Extremities: no lower extremity edema bilaterally Skin: no lesions on visible extremities    Assessment and plan: 78 y.o. male with  Hemoccult-positive, mild normocytic anemia with low ferritin.  We will proceed with colonoscopy at his soonest convenience. I see no reason for any further blood tests or imaging prior to then.

## 2012-04-03 NOTE — Patient Instructions (Addendum)
You will be set up for a colonoscopy.  

## 2012-04-06 DIAGNOSIS — M25569 Pain in unspecified knee: Secondary | ICD-10-CM | POA: Diagnosis not present

## 2012-04-06 DIAGNOSIS — M1710 Unilateral primary osteoarthritis, unspecified knee: Secondary | ICD-10-CM | POA: Diagnosis not present

## 2012-04-29 DIAGNOSIS — H52229 Regular astigmatism, unspecified eye: Secondary | ICD-10-CM | POA: Diagnosis not present

## 2012-04-29 DIAGNOSIS — H524 Presbyopia: Secondary | ICD-10-CM | POA: Diagnosis not present

## 2012-04-29 DIAGNOSIS — H2589 Other age-related cataract: Secondary | ICD-10-CM | POA: Diagnosis not present

## 2012-04-29 DIAGNOSIS — H5210 Myopia, unspecified eye: Secondary | ICD-10-CM | POA: Diagnosis not present

## 2012-04-29 DIAGNOSIS — Z01818 Encounter for other preprocedural examination: Secondary | ICD-10-CM | POA: Diagnosis not present

## 2012-04-29 DIAGNOSIS — H43819 Vitreous degeneration, unspecified eye: Secondary | ICD-10-CM | POA: Diagnosis not present

## 2012-05-04 ENCOUNTER — Telehealth: Payer: Self-pay | Admitting: Family Medicine

## 2012-05-04 NOTE — Telephone Encounter (Signed)
Patient needs to know if he needs to be fasting for his 05/11/12 appt

## 2012-05-04 NOTE — Telephone Encounter (Signed)
Advised pt he does not need to be fasting on 8/12.  Pt voices understanding.

## 2012-05-11 ENCOUNTER — Encounter: Payer: Self-pay | Admitting: Family Medicine

## 2012-05-11 ENCOUNTER — Ambulatory Visit (INDEPENDENT_AMBULATORY_CARE_PROVIDER_SITE_OTHER): Payer: Medicare Other | Admitting: Family Medicine

## 2012-05-11 VITALS — BP 156/82 | HR 68 | Temp 96.4°F | Ht 70.0 in | Wt 210.8 lb

## 2012-05-11 DIAGNOSIS — I10 Essential (primary) hypertension: Secondary | ICD-10-CM | POA: Diagnosis not present

## 2012-05-11 DIAGNOSIS — D649 Anemia, unspecified: Secondary | ICD-10-CM

## 2012-05-11 NOTE — Progress Notes (Signed)
OFFICE VISIT  05/14/2012   CC:  Chief Complaint  Patient presents with  . Follow-up    Pt stated Red blood count down.     HPI:    Patient is a 78 y.o. Caucasian male who presents for 2 mo f/u iron def anemia and HTN. I increased his bp med last visit.  He says bp checks at home have essentially been in normal range.  Says [facility] machine is always lower than his home checks.  He says if he takes his bp med on empty stomach it makes him "sick". He is set up for a colonoscopy tomorrow for his iron def w/mild anemia.   Says he's doing pretty well/feels pretty good overall. Says he took the iron pill I recommended for several weeks but stopped it b/c it made him feel sick to his stomach.  Past Medical History  Diagnosis Date  . Hypertension   . Hyperlipidemia   . GERD (gastroesophageal reflux disease)   . DDD (degenerative disc disease)     L-spine  . Arthritis     Past Surgical History  Procedure Date  . Appendectomy 1935  . Tonsillectomy 1935    Outpatient Prescriptions Prior to Visit  Medication Sig Dispense Refill  . aspirin 81 MG tablet Take 81 mg by mouth daily.        . Cholecalciferol (VITAMIN D3) 2000 UNITS TABS Take by mouth daily.        Marland Kitchen losartan-hydrochlorothiazide (HYZAAR) 100-25 MG per tablet Take 1 tablet by mouth daily.  30 tablet  1  . multivitamin (THERAGRAN) per tablet Take 1 tablet by mouth daily.        . naproxen sodium (ANAPROX) 220 MG tablet Take 220 mg by mouth daily as needed.        Marland Kitchen omeprazole (PRILOSEC) 20 MG capsule Take 1 capsule (20 mg total) by mouth daily.  90 capsule  3  . pravastatin (PRAVACHOL) 80 MG tablet Take 1 tablet (80 mg total) by mouth daily.  90 tablet  0  . verapamil (CALAN-SR) 240 MG CR tablet Take 1 tablet (240 mg total) by mouth 2 (two) times daily.  180 tablet  0  . MOVIPREP 100 G SOLR Take 1 kit (100 g total) by mouth as directed. Name brand only  1 kit  0  . ferrous sulfate (QC FERROUS SULFATE) 325 (65 FE) MG tablet Take  325 mg by mouth daily with breakfast.        No Known Allergies  ROS As per HPI  PE: Blood pressure 156/82, pulse 68, temperature 96.4 F (35.8 C), height 5\' 10"  (1.778 m), weight 210 lb 12.8 oz (95.618 kg), SpO2 93.00%. Gen: Alert, well appearing.  Patient is oriented to person, place, time, and situation. Affect: calm, pleasant ENT: no icteris or pallor Neck: supple/nontender.  No LAD, mass, or TM.  Carotid pulses 2+ bilaterally, without bruits. CV: RRR, no m/r/g.   LUNGS: CTA bilat, nonlabored resps, good aeration in all lung fields.   LABS:  Lab Results  Component Value Date   WBC 6.7 03/10/2012   HGB 12.7* 03/10/2012   HCT 38.8* 03/10/2012   MCV 96.8 03/10/2012   PLT 336.0 03/10/2012   Lab Results  Component Value Date   IRON 70 03/10/2012   FERRITIN 16.1* 03/10/2012   Lab Results  Component Value Date   VITAMINB12 367 03/10/2012    IMPRESSION AND PLAN:  ESSENTIAL HYPERTENSION, BENIGN Stable.  No changes in meds recommended at this  time.    Normocytic anemia His iron testing was low and I've tried to get him to take iron regularly. I told him again today that I recommend he take SOME KIND of iron supplement daily for at least the next 4 mo--I gave samples of integra today (ferrous fumarate) to take 1 tab daily.  He says he may also retry FeSO4.  Seems like he understands he needs to take meds with food but doesn't consistently do this. He has colonoscopy tomorrow.    FOLLOW UP: Return in about 3 months (around 08/11/2012).

## 2012-05-11 NOTE — Patient Instructions (Addendum)
Try the new iron pill I gave you today and call if you tolerate it better than your previous iron pill.

## 2012-05-12 ENCOUNTER — Encounter: Payer: Self-pay | Admitting: Gastroenterology

## 2012-05-12 ENCOUNTER — Telehealth: Payer: Self-pay

## 2012-05-12 ENCOUNTER — Ambulatory Visit (AMBULATORY_SURGERY_CENTER): Payer: Medicare Other | Admitting: Gastroenterology

## 2012-05-12 VITALS — BP 132/74 | HR 44 | Temp 97.1°F | Resp 27 | Ht 69.0 in | Wt 212.0 lb

## 2012-05-12 DIAGNOSIS — R195 Other fecal abnormalities: Secondary | ICD-10-CM

## 2012-05-12 DIAGNOSIS — D649 Anemia, unspecified: Secondary | ICD-10-CM | POA: Diagnosis not present

## 2012-05-12 HISTORY — PX: COLONOSCOPY: SHX174

## 2012-05-12 MED ORDER — SODIUM CHLORIDE 0.9 % IV SOLN: 500.0000 mL | INTRAVENOUS | Status: DC

## 2012-05-12 NOTE — Telephone Encounter (Signed)
Pt needs to schedule EGD to continue work up for heme positive stools and iron def. anemia

## 2012-05-12 NOTE — Op Note (Signed)
[facility] [address] [place], [place]  [address]  COLONOSCOPY PROCEDURE REPORT  PATIENT:  Glen, Barnett  MR#:  [id number] BIRTHDATE:  1927/09/30, 84 yrs. old  GENDER:  male ENDOSCOPIST:  Rachael Fee, MD REF. BY:  Earley Favor, M.D. PROCEDURE DATE:  05/12/2012 PROCEDURE:  Colonoscopy 19147 ASA CLASS:  Class II INDICATIONS:  heme + anemia, IDA MEDICATIONS:  Fentanyl 50 mcg IV, These medications were titrated to patient response per physician's verbal order, Versed 5 mg IV  DESCRIPTION OF PROCEDURE:   After the risks benefits and alternatives of the procedure were thoroughly explained, informed consent was obtained.  Digital rectal exam was performed and revealed no rectal masses.   The LB [id number] endoscope was introduced through the anus and advanced to the cecum, which was identified by both the appendix and ileocecal valve, without limitations.  The quality of the prep was good..  The instrument was then slowly withdrawn as the colon was fully examined. <<PROCEDUREIMAGES>> FINDINGS:  A normal appearing cecum, ileocecal valve, and appendiceal orifice were identified. The ascending, hepatic flexure, transverse, splenic flexure, descending, sigmoid colon, and rectum appeared unremarkable (see image1, image2, and image3). Retroflexed views in the rectum revealed no abnormalities. COMPLICATIONS:  None  ENDOSCOPIC IMPRESSION: 1) Normal colon 2) No polyps or cancers  RECOMMENDATIONS: Dr. Christella Hartigan' office will get in touch to schedule upper endoscopy to continue workup for your heme positive stool, iron deficiency anemia  ______________________________ Rachael Fee, MD  n. eSIGNED:   Rachael Fee at 05/12/2012 02:21 PM  [name], [phone]

## 2012-05-12 NOTE — Progress Notes (Signed)
1430-Verbally clarified with Dr. Christella Hartigan for scheduling of egd he stated that my office will contact him to schedule.

## 2012-05-12 NOTE — Patient Instructions (Addendum)
YOU HAD AN ENDOSCOPIC PROCEDURE TODAY AT THE [facility]: Refer to the procedure report that was given to you for any specific questions about what was found during the examination.  If the procedure report does not answer your questions, please call your gastroenterologist to clarify.  If you requested that your care partner not be given the details of your procedure findings, then the procedure report has been included in a sealed envelope for you to review at your convenience later.  YOU SHOULD EXPECT: Some feelings of bloating in the abdomen. Passage of more gas than usual.  Walking can help get rid of the air that was put into your GI tract during the procedure and reduce the bloating. If you had a lower endoscopy (such as a colonoscopy or flexible sigmoidoscopy) you may notice spotting of blood in your stool or on the toilet paper. If you underwent a bowel prep for your procedure, then you may not have a normal bowel movement for a few days.  DIET: Your first meal following the procedure should be a light meal and then it is ok to progress to your normal diet.  A half-sandwich or bowl of soup is an example of a good first meal.  Heavy or fried foods are harder to digest and may make you feel nauseous or bloated.  Likewise meals heavy in dairy and vegetables can cause extra gas to form and this can also increase the bloating.  Drink plenty of fluids but you should avoid alcoholic beverages for 24 hours.  ACTIVITY: Your care partner should take you home directly after the procedure.  You should plan to take it easy, moving slowly for the rest of the day.  You can resume normal activity the day after the procedure however you should NOT DRIVE or use heavy machinery for 24 hours (because of the sedation medicines used during the test).    SYMPTOMS TO REPORT IMMEDIATELY: A gastroenterologist can be reached at any hour.  During normal business hours, 8:30 AM to 5:00 PM Monday through Friday,  call [phone].  After hours and on weekends, please call the GI answering service at [phone] who will take a message and have the physician on call contact you.   Following lower endoscopy (colonoscopy or flexible sigmoidoscopy):  Excessive amounts of blood in the stool  Significant tenderness or worsening of abdominal pains  Swelling of the abdomen that is new, acute  Fever of 100F or higher   FOLLOW UP: If any biopsies were taken you will be contacted by phone or by letter within the next 1-3 weeks.  Call your gastroenterologist if you have not heard about the biopsies in 3 weeks.  Our staff will call the home number listed on your records the next business day following your procedure to check on you and address any questions or concerns that you may have at that time regarding the information given to you following your procedure. This is a courtesy call and so if there is no answer at the home number and we have not heard from you through the emergency physician on call, we will assume that you have returned to your regular daily activities without incident.  SIGNATURES/CONFIDENTIALITY:  Resume medications. Office will contact you to schedule egd. You and/or your care partner have signed paperwork which will be entered into your electronic medical record.  These signatures attest to the fact that that the information above on your After Visit Summary has been reviewed  and is understood.  Full responsibility of the confidentiality of this discharge information lies with you and/or your care-partner.

## 2012-05-12 NOTE — Progress Notes (Signed)
Patient did not experience any of the following events: a burn prior to discharge; a fall within the facility; wrong site/side/patient/procedure/implant event; or a hospital transfer or hospital admission upon discharge from the facility. (G8907) Patient did not have preoperative order for IV antibiotic SSI prophylaxis. (G8918)  

## 2012-05-13 ENCOUNTER — Telehealth: Payer: Self-pay

## 2012-05-13 NOTE — Telephone Encounter (Signed)
Left message on machine to call back  

## 2012-05-13 NOTE — Telephone Encounter (Signed)
  Follow up Call-  Call back number 05/12/2012  Post procedure Call Back phone  # [phone]  Permission to leave phone message Yes     Patient questions:  Do you have a fever, pain , or abdominal swelling? no Pain Score  0 *  Have you tolerated food without any problems? yes  Have you been able to return to your normal activities? yes  Do you have any questions about your discharge instructions: Diet   no Medications  no Follow up visit  no  Do you have questions or concerns about your Care? no  Actions: * If pain score is 4 or above: No action needed, pain <4.

## 2012-05-14 ENCOUNTER — Encounter: Payer: Self-pay | Admitting: Family Medicine

## 2012-05-14 DIAGNOSIS — Z7982 Long term (current) use of aspirin: Secondary | ICD-10-CM | POA: Diagnosis not present

## 2012-05-14 DIAGNOSIS — E7800 Pure hypercholesterolemia, unspecified: Secondary | ICD-10-CM | POA: Diagnosis not present

## 2012-05-14 DIAGNOSIS — H2510 Age-related nuclear cataract, unspecified eye: Secondary | ICD-10-CM | POA: Diagnosis not present

## 2012-05-14 DIAGNOSIS — K219 Gastro-esophageal reflux disease without esophagitis: Secondary | ICD-10-CM | POA: Diagnosis not present

## 2012-05-14 DIAGNOSIS — H2589 Other age-related cataract: Secondary | ICD-10-CM | POA: Diagnosis not present

## 2012-05-14 DIAGNOSIS — I10 Essential (primary) hypertension: Secondary | ICD-10-CM | POA: Diagnosis not present

## 2012-05-14 NOTE — Assessment & Plan Note (Signed)
His iron testing was low and I've tried to get him to take iron regularly. I told him again today that I recommend he take SOME KIND of iron supplement daily for at least the next 4 mo--I gave samples of integra today (ferrous fumarate) to take 1 tab daily.  He says he may also retry FeSO4.  Seems like he understands he needs to take meds with food but doesn't consistently do this. He has colonoscopy tomorrow.

## 2012-05-14 NOTE — Assessment & Plan Note (Signed)
Stable.  No changes in meds recommended at this time.

## 2012-05-15 NOTE — Telephone Encounter (Signed)
Left message on machine to call back  

## 2012-05-15 NOTE — Telephone Encounter (Signed)
Letter mailed

## 2012-05-20 ENCOUNTER — Telehealth: Payer: Self-pay | Admitting: Gastroenterology

## 2012-05-21 ENCOUNTER — Telehealth: Payer: Self-pay | Admitting: *Deleted

## 2012-05-21 DIAGNOSIS — Z7982 Long term (current) use of aspirin: Secondary | ICD-10-CM | POA: Diagnosis not present

## 2012-05-21 DIAGNOSIS — K219 Gastro-esophageal reflux disease without esophagitis: Secondary | ICD-10-CM | POA: Diagnosis not present

## 2012-05-21 DIAGNOSIS — E7800 Pure hypercholesterolemia, unspecified: Secondary | ICD-10-CM | POA: Diagnosis not present

## 2012-05-21 DIAGNOSIS — I10 Essential (primary) hypertension: Secondary | ICD-10-CM | POA: Diagnosis not present

## 2012-05-21 DIAGNOSIS — H2510 Age-related nuclear cataract, unspecified eye: Secondary | ICD-10-CM | POA: Diagnosis not present

## 2012-05-21 DIAGNOSIS — H35419 Lattice degeneration of retina, unspecified eye: Secondary | ICD-10-CM | POA: Diagnosis not present

## 2012-05-21 DIAGNOSIS — H2589 Other age-related cataract: Secondary | ICD-10-CM | POA: Diagnosis not present

## 2012-05-21 DIAGNOSIS — Z961 Presence of intraocular lens: Secondary | ICD-10-CM | POA: Diagnosis not present

## 2012-05-21 NOTE — Telephone Encounter (Signed)
Pt is currently having cataract surgery and will contact the opthamologist regarding any length of time needed to heal prior to the EGD  The pt will call back to schedule when appropriate

## 2012-05-21 NOTE — Telephone Encounter (Signed)
Pt presents at office wanting to know which iron supplement to take.  He has used Integra samples and also OTC supplement.  Pt states he thinks Integra worked better.  Is it OK to call in Integra?  How often?  Please advise.

## 2012-05-21 NOTE — Telephone Encounter (Signed)
Yes, ok to call in Integra, 1 cap once daily, #30, RF x 4.-thx

## 2012-05-22 ENCOUNTER — Telehealth: Payer: Self-pay | Admitting: Gastroenterology

## 2012-05-22 ENCOUNTER — Telehealth: Payer: Self-pay | Admitting: Family Medicine

## 2012-05-22 MED ORDER — INTEGRA PLUS PO CAPS: 1.0000 | ORAL_CAPSULE | Freq: Every day | ORAL | Status: DC

## 2012-05-22 NOTE — Telephone Encounter (Signed)
PT notified and will try Integra.

## 2012-05-22 NOTE — Telephone Encounter (Signed)
Call returned in phone note dated 05/21/12

## 2012-05-25 NOTE — Telephone Encounter (Signed)
Pt has been scheduled for EGD and Previsit  Letter has been mailed

## 2012-06-09 ENCOUNTER — Other Ambulatory Visit: Payer: Self-pay | Admitting: *Deleted

## 2012-06-09 MED ORDER — LOSARTAN POTASSIUM-HCTZ 100-25 MG PO TABS: 1.0000 | ORAL_TABLET | Freq: Every day | ORAL | Status: DC

## 2012-06-09 NOTE — Telephone Encounter (Signed)
Faxed refill request received from pharmacy for Hyzaar Last filled by MD on 03/10/12, #30 x 1 Last seen on 05/11/12 Follow up in 3 months. RX sent.

## 2012-06-10 ENCOUNTER — Ambulatory Visit (AMBULATORY_SURGERY_CENTER): Payer: Medicare Other

## 2012-06-10 VITALS — Ht 70.0 in | Wt 210.6 lb

## 2012-06-10 DIAGNOSIS — D509 Iron deficiency anemia, unspecified: Secondary | ICD-10-CM

## 2012-06-10 DIAGNOSIS — R195 Other fecal abnormalities: Secondary | ICD-10-CM

## 2012-06-17 ENCOUNTER — Ambulatory Visit (AMBULATORY_SURGERY_CENTER): Payer: Medicare Other | Admitting: Gastroenterology

## 2012-06-17 ENCOUNTER — Encounter: Payer: Self-pay | Admitting: Gastroenterology

## 2012-06-17 ENCOUNTER — Other Ambulatory Visit: Payer: Self-pay | Admitting: Gastroenterology

## 2012-06-17 VITALS — BP 140/91 | HR 65 | Temp 97.5°F | Resp 18 | Ht 70.0 in | Wt 210.0 lb

## 2012-06-17 DIAGNOSIS — K2970 Gastritis, unspecified, without bleeding: Secondary | ICD-10-CM

## 2012-06-17 DIAGNOSIS — D509 Iron deficiency anemia, unspecified: Secondary | ICD-10-CM | POA: Diagnosis not present

## 2012-06-17 DIAGNOSIS — R195 Other fecal abnormalities: Secondary | ICD-10-CM | POA: Diagnosis not present

## 2012-06-17 DIAGNOSIS — K2960 Other gastritis without bleeding: Secondary | ICD-10-CM | POA: Diagnosis not present

## 2012-06-17 DIAGNOSIS — K449 Diaphragmatic hernia without obstruction or gangrene: Secondary | ICD-10-CM

## 2012-06-17 DIAGNOSIS — K2990 Gastroduodenitis, unspecified, without bleeding: Secondary | ICD-10-CM

## 2012-06-17 HISTORY — PX: ESOPHAGOGASTRODUODENOSCOPY: SHX1529

## 2012-06-17 MED ORDER — SODIUM CHLORIDE 0.9 % IV SOLN: 500.0000 mL | INTRAVENOUS | Status: DC

## 2012-06-17 NOTE — Patient Instructions (Addendum)
YOU HAD AN ENDOSCOPIC PROCEDURE TODAY AT THE [facility]: Refer to the procedure report that was given to you for any specific questions about what was found during the examination.  If the procedure report does not answer your questions, please call your gastroenterologist to clarify.  If you requested that your care partner not be given the details of your procedure findings, then the procedure report has been included in a sealed envelope for you to review at your convenience later.  YOU SHOULD EXPECT: Some feelings of bloating in the abdomen. Passage of more gas than usual.  Walking can help get rid of the air that was put into your GI tract during the procedure and reduce the bloating. DIET: Your first meal following the procedure should be a light meal and then it is ok to progress to your normal diet.  A half-sandwich or bowl of soup is an example of a good first meal.  Heavy or fried foods are harder to digest and may make you feel nauseous or bloated.  Likewise meals heavy in dairy and vegetables can cause extra gas to form and this can also increase the bloating.  Drink plenty of fluids but you should avoid alcoholic beverages for 24 hours.  ACTIVITY: Your care partner should take you home directly after the procedure.  You should plan to take it easy, moving slowly for the rest of the day.  You can resume normal activity the day after the procedure however you should NOT DRIVE or use heavy machinery for 24 hours (because of the sedation medicines used during the test).    SYMPTOMS TO REPORT IMMEDIATELY: A gastroenterologist can be reached at any hour.  During normal business hours, 8:30 AM to 5:00 PM Monday through Friday, call [phone].  After hours and on weekends, please call the GI answering service at [phone] who will take a message and have the physician on call contact you.   Following upper endoscopy (EGD)  Vomiting of blood or coffee ground material  New  chest pain or pain under the shoulder blades  Painful or persistently difficult swallowing  New shortness of breath  Fever of 100F or higher  Black, tarry-looking stools  FOLLOW UP: If any biopsies were taken you will be contacted by phone or by letter within the next 1-3 weeks.  Call your gastroenterologist if you have not heard about the biopsies in 3 weeks.  Our staff will call the home number listed on your records the next business day following your procedure to check on you and address any questions or concerns that you may have at that time regarding the information given to you following your procedure. This is a courtesy call and so if there is no answer at the home number and we have not heard from you through the emergency physician on call, we will assume that you have returned to your regular daily activities without incident.  SIGNATURES/CONFIDENTIALITY: You and/or your care partner have signed paperwork which will be entered into your electronic medical record.  These signatures attest to the fact that that the information above on your After Visit Summary has been reviewed and is understood.  Full responsibility of the confidentiality of this discharge information lies with you and/or your care-partner.   Ok to resume your normal medications

## 2012-06-17 NOTE — Progress Notes (Signed)
Patient did not experience any of the following events: a burn prior to discharge; a fall within the facility; wrong site/side/patient/procedure/implant event; or a hospital transfer or hospital admission upon discharge from the facility. (G8907) Patient did not have preoperative order for IV antibiotic SSI prophylaxis. (G8918)  

## 2012-06-17 NOTE — Op Note (Signed)
[facility] [address] [place], [address]   ENDOSCOPY PROCEDURE REPORT  PATIENT: Glen, Barnett.  MR#: #[id number] BIRTHDATE: 09-18-1927 , 84  yrs. old GENDER: Male ENDOSCOPIST: Rachael Fee, MD REFERRED BY:  Pearson Grippe, M.D. PROCEDURE DATE:  06/17/2012 PROCEDURE:  EGD w/ biopsy ASA CLASS:     Class III INDICATIONS:  FOBT positive, IDA (mild). MEDICATIONS: Fentanyl 50 mcg IV, Versed 6 mg IV, and These medications were titrated to patient response per physician's verbal order TOPICAL ANESTHETIC: Cetacaine Spray  DESCRIPTION OF PROCEDURE: After the risks benefits and alternatives of the procedure were thoroughly explained, informed consent was obtained.  The [facility] [id number] endoscope was introduced through the mouth and advanced to the second portion of the duodenum. Without limitations.  The instrument was slowly withdrawn as the mucosa was fully examined.   There was a 4-5cm hiatal hernia and at the neck of the hernia there were several classic appearing Cameron's erosions along stomach. All were superficial.  There was mild distal non-specific gastritis which was biopsied and sent to pathology (jar 1).  Retroflexed views revealed no abnormalities.  The examination was otherwise normal. The scope was then withdrawn from the patient and the procedure completed. COMPLICATIONS: There were no complications.  ENDOSCOPIC IMPRESSION: Hiatal hernia with associated Cameron's erosions. This is likely source of mild iron def anemia.  RECOMMENDATIONS: If biopsies show H.  pylori, you will be started on appropriate antibiotics. You should stay on iron once daily   eSigned:  Rachael Fee, MD 06/17/2012 2:19 PM

## 2012-06-18 ENCOUNTER — Telehealth: Payer: Self-pay | Admitting: *Deleted

## 2012-06-18 NOTE — Telephone Encounter (Signed)
  Follow up Call-  Call back number 06/17/2012 05/12/2012  Post procedure Call Back phone  # [phone]  Permission to leave phone message Yes Yes     Reached recording that number has been disconnected, no other number listed.

## 2012-06-26 ENCOUNTER — Telehealth: Payer: Self-pay | Admitting: Family Medicine

## 2012-06-26 NOTE — Telephone Encounter (Signed)
Please contact patient with endo results. He has a working Arboriculturist.

## 2012-06-30 ENCOUNTER — Encounter: Payer: Self-pay | Admitting: Gastroenterology

## 2012-07-05 ENCOUNTER — Encounter: Payer: Self-pay | Admitting: Family Medicine

## 2012-08-11 ENCOUNTER — Encounter: Payer: Self-pay | Admitting: Family Medicine

## 2012-08-11 ENCOUNTER — Ambulatory Visit (INDEPENDENT_AMBULATORY_CARE_PROVIDER_SITE_OTHER): Payer: Medicare Other | Admitting: Family Medicine

## 2012-08-11 VITALS — BP 137/82 | HR 69 | Ht 70.0 in | Wt 208.0 lb

## 2012-08-11 DIAGNOSIS — Z23 Encounter for immunization: Secondary | ICD-10-CM

## 2012-08-11 DIAGNOSIS — E785 Hyperlipidemia, unspecified: Secondary | ICD-10-CM

## 2012-08-11 DIAGNOSIS — D509 Iron deficiency anemia, unspecified: Secondary | ICD-10-CM | POA: Diagnosis not present

## 2012-08-11 DIAGNOSIS — I10 Essential (primary) hypertension: Secondary | ICD-10-CM

## 2012-08-11 LAB — IBC PANEL
Saturation Ratios: 18.2 % — ABNORMAL LOW (ref 20.0–50.0)
Transferrin: 243.2 mg/dL (ref 212.0–360.0)

## 2012-08-11 LAB — CBC WITH DIFFERENTIAL/PLATELET
Basophils Relative: 0.5 % (ref 0.0–3.0)
Eosinophils Relative: 4.8 % (ref 0.0–5.0)
HCT: 39.5 % (ref 39.0–52.0)
Hemoglobin: 12.8 g/dL — ABNORMAL LOW (ref 13.0–17.0)
MCHC: 32.5 g/dL (ref 30.0–36.0)
MCV: 99 fl (ref 78.0–100.0)
Monocytes Absolute: 0.7 10*3/uL (ref 0.1–1.0)
Neutro Abs: 4.1 10*3/uL (ref 1.4–7.7)
Neutrophils Relative %: 64 % (ref 43.0–77.0)
RBC: 3.99 Mil/uL — ABNORMAL LOW (ref 4.22–5.81)
WBC: 6.4 10*3/uL (ref 4.5–10.5)

## 2012-08-11 NOTE — Progress Notes (Signed)
OFFICE NOTE  08/11/2012  CC: No chief complaint on file.    HPI: Patient is a 78 y.o. Caucasian male who is here for 3 month f/u iron deficiency anemia secondary to esophageal erosions.  Integra causes him to feel upset stomach, w/out n/v and without constipation.  He takes it about qod.  He says his losartan/hctz makes him have upset stomach until he eats.  Overall, he reports feeling pretty good.   Pertinent PMH:  Past Medical History  Diagnosis Date  . Hypertension   . Hyperlipidemia   . GERD (gastroesophageal reflux disease)   . DDD (degenerative disc disease)     L-spine  . Arthritis   . Cataract   . Anemia 02/2012    Mild iron deficiency  . Heme positive stool 02/2012    colonoscopy normal.  EGD showed small hiatal hernia with associated Cameron's erosions.    MEDS:  Outpatient Prescriptions Prior to Visit  Medication Sig Dispense Refill  . aspirin 81 MG tablet Take 81 mg by mouth daily.        . Cholecalciferol (VITAMIN D3) 2000 UNITS TABS Take by mouth daily.        Marland Kitchen FeFum-FePoly-FA-B Cmp-C-Biot (INTEGRA PLUS) CAPS Take 1 capsule by mouth daily.  30 capsule  4  . losartan-hydrochlorothiazide (HYZAAR) 100-25 MG per tablet Take 1 tablet by mouth daily.  30 tablet  5  . multivitamin (THERAGRAN) per tablet Take 1 tablet by mouth daily.        Marland Kitchen omeprazole (PRILOSEC) 20 MG capsule Take 1 capsule (20 mg total) by mouth daily.  90 capsule  3  . pravastatin (PRAVACHOL) 80 MG tablet Take 1 tablet (80 mg total) by mouth daily.  90 tablet  0  . prednisoLONE acetate (PRED FORTE) 1 % ophthalmic suspension Place 1 drop into both eyes 2 (two) times daily.       . verapamil (CALAN-SR) 240 MG CR tablet Take 1 tablet (240 mg total) by mouth 2 (two) times daily.  180 tablet  0  . naproxen sodium (ANAPROX) 220 MG tablet Take 220 mg by mouth daily as needed.        . [DISCONTINUED] NEVANAC 0.1 % ophthalmic suspension Place 1 drop into both eyes 2 (two) times daily.       . [DISCONTINUED]  VIGAMOX 0.5 % ophthalmic solution        Last reviewed on 08/11/2012 10:37 AM by Jeoffrey Massed, MD  PE: Blood pressure 137/82, pulse 69, height 5\' 10"  (1.778 m), weight 208 lb (94.348 kg). Gen: Alert, well appearing.  Patient is oriented to person, place, time, and situation. AFFECT: pleasant, lucid thought and speech.  SKIN: no jaundice or pallor. No further exam today.  IMPRESSION AND PLAN:  Iron deficiency anemia His iron replacement has been sub-optimal, but we'll recheck labs and hemoccults today to see if things are resolving. He has still only been on prilosec 20mg  qd (before and after his EGD), so if iron not up much and/or if hemoccults still positive then will increase prilosec to 40mg  qd.  ESSENTIAL HYPERTENSION, BENIGN Stable. Med causing nausea on empty stomach, so he will change timing of dosing to after his supper meal.  HYPERLIPIDEMIA Lab Results  Component Value Date   CHOL 157 03/10/2012   HDL 54.40 03/10/2012   LDLCALC 80 03/10/2012   LDLDIRECT 112.5 09/11/2011   TRIG 114.0 03/10/2012   CHOLHDL 3 03/10/2012   Stable.  Tolerating meds. Recheck lipids 02/2013.   Flu  vaccine IM today.  An After Visit Summary was printed and given to the patient.  FOLLOW UP: 3-4 mo

## 2012-08-11 NOTE — Assessment & Plan Note (Signed)
Stable. Med causing nausea on empty stomach, so he will change timing of dosing to after his supper meal.

## 2012-08-11 NOTE — Assessment & Plan Note (Addendum)
His iron replacement has been sub-optimal, but we'll recheck labs and hemoccults today to see if things are resolving. He has still only been on prilosec 20mg  qd (before and after his EGD), so if iron not up much and/or if hemoccults still positive then will increase prilosec to 40mg  qd.

## 2012-08-13 DIAGNOSIS — D485 Neoplasm of uncertain behavior of skin: Secondary | ICD-10-CM | POA: Diagnosis not present

## 2012-08-13 DIAGNOSIS — L259 Unspecified contact dermatitis, unspecified cause: Secondary | ICD-10-CM | POA: Diagnosis not present

## 2012-08-13 DIAGNOSIS — L820 Inflamed seborrheic keratosis: Secondary | ICD-10-CM | POA: Diagnosis not present

## 2012-08-13 DIAGNOSIS — L570 Actinic keratosis: Secondary | ICD-10-CM | POA: Diagnosis not present

## 2012-08-13 NOTE — Assessment & Plan Note (Signed)
Lab Results  Component Value Date   CHOL 157 03/10/2012   HDL 54.40 03/10/2012   LDLCALC 80 03/10/2012   LDLDIRECT 112.5 09/11/2011   TRIG 114.0 03/10/2012   CHOLHDL 3 03/10/2012   Stable.  Tolerating meds. Recheck lipids 02/2013.

## 2012-08-28 ENCOUNTER — Other Ambulatory Visit: Payer: Medicare Other

## 2012-08-28 ENCOUNTER — Telehealth: Payer: Self-pay | Admitting: *Deleted

## 2012-08-28 ENCOUNTER — Other Ambulatory Visit: Payer: Self-pay | Admitting: Family Medicine

## 2012-08-28 DIAGNOSIS — D509 Iron deficiency anemia, unspecified: Secondary | ICD-10-CM

## 2012-08-28 MED ORDER — OMEPRAZOLE 40 MG PO CPDR: 40.0000 mg | DELAYED_RELEASE_CAPSULE | Freq: Every day | ORAL | Status: DC

## 2012-08-28 NOTE — Telephone Encounter (Signed)
PC for [facility] stating 2 out of 6 hemoccult slides are positive.  Please advise.  No lab results in chart at this time.

## 2012-08-28 NOTE — Telephone Encounter (Signed)
Pls notify pt that he is still having some slight bleeding from his esophagus. I want him to double up on his prilosec 20mg  tabs so he takes a total dose of 40mg  once daily every morning. I'll do new eRx for the 40mg  strength tab to take when he runs out of his 20s. Also, make sure he is taking his iron supplement with orange juice twice per day. Needs f/u CBC and iron panel in 4-6 weeks.-thx

## 2012-08-28 NOTE — Telephone Encounter (Signed)
Message left at home number to return call. 

## 2012-09-01 ENCOUNTER — Other Ambulatory Visit (INDEPENDENT_AMBULATORY_CARE_PROVIDER_SITE_OTHER): Payer: Medicare Other

## 2012-09-01 DIAGNOSIS — D509 Iron deficiency anemia, unspecified: Secondary | ICD-10-CM | POA: Diagnosis not present

## 2012-09-01 LAB — HEMOCCULT SLIDES (X 3 CARDS)
OCCULT 1: POSITIVE
OCCULT 2: POSITIVE
OCCULT 3: NEGATIVE
OCCULT 5: NEGATIVE

## 2012-09-03 NOTE — Telephone Encounter (Signed)
Pt notified.  He states he did not get my previous call.  He is agreeable with plan below.

## 2012-09-09 NOTE — Telephone Encounter (Signed)
Please put orders into EPIC

## 2012-09-09 NOTE — Telephone Encounter (Signed)
Patient came into office & seemed confused about his instructions. Printed a copy of the note & gave to patient. Patient stated he understood the note & made his lab appt to have his CBC & iron panel checked after he got back from visiting his son in [place].

## 2012-09-10 NOTE — Addendum Note (Signed)
Addended by: Luisa Dago on: 09/10/2012 01:52 PM   Modules accepted: Orders

## 2012-09-10 NOTE — Telephone Encounter (Signed)
CBC and IBC panel order entered.

## 2012-10-05 ENCOUNTER — Encounter: Payer: Self-pay | Admitting: Family Medicine

## 2012-10-05 ENCOUNTER — Other Ambulatory Visit (INDEPENDENT_AMBULATORY_CARE_PROVIDER_SITE_OTHER): Payer: Medicare Other

## 2012-10-05 ENCOUNTER — Ambulatory Visit (INDEPENDENT_AMBULATORY_CARE_PROVIDER_SITE_OTHER): Payer: Medicare Other | Admitting: Family Medicine

## 2012-10-05 VITALS — BP 140/90 | HR 79 | Temp 97.7°F | Ht 70.0 in | Wt 207.0 lb

## 2012-10-05 DIAGNOSIS — E785 Hyperlipidemia, unspecified: Secondary | ICD-10-CM | POA: Diagnosis not present

## 2012-10-05 DIAGNOSIS — L989 Disorder of the skin and subcutaneous tissue, unspecified: Secondary | ICD-10-CM | POA: Diagnosis not present

## 2012-10-05 DIAGNOSIS — L03039 Cellulitis of unspecified toe: Secondary | ICD-10-CM

## 2012-10-05 DIAGNOSIS — I10 Essential (primary) hypertension: Secondary | ICD-10-CM

## 2012-10-05 DIAGNOSIS — L03032 Cellulitis of left toe: Secondary | ICD-10-CM

## 2012-10-05 DIAGNOSIS — D509 Iron deficiency anemia, unspecified: Secondary | ICD-10-CM

## 2012-10-05 LAB — IBC PANEL
Iron: 59 ug/dL (ref 42–165)
Saturation Ratios: 16.5 % — ABNORMAL LOW (ref 20.0–50.0)
Transferrin: 254.9 mg/dL (ref 212.0–360.0)

## 2012-10-05 LAB — CBC WITH DIFFERENTIAL/PLATELET
Basophils Relative: 0.4 % (ref 0.0–3.0)
Eosinophils Relative: 3.6 % (ref 0.0–5.0)
Lymphocytes Relative: 18.9 % (ref 12.0–46.0)
MCV: 96.8 fl (ref 78.0–100.0)
Monocytes Absolute: 0.7 10*3/uL (ref 0.1–1.0)
Neutrophils Relative %: 68.8 % (ref 43.0–77.0)
Platelets: 392 10*3/uL (ref 150.0–400.0)
RBC: 4 Mil/uL — ABNORMAL LOW (ref 4.22–5.81)
WBC: 8 10*3/uL (ref 4.5–10.5)

## 2012-10-05 MED ORDER — CEPHALEXIN 500 MG PO CAPS: 500.0000 mg | ORAL_CAPSULE | Freq: Three times a day (TID) | ORAL | Status: DC

## 2012-10-05 NOTE — Progress Notes (Signed)
OFFICE NOTE  10/08/2012  CC:  Chief Complaint  Patient presents with  . Toe Pain    Left great toe; white streak down nail for months, but hit toe recently and now having pain x 1 week  . skin lesions    moles/lesions (?) on hands and back; ? if he needs to go back to dermatology to have removed; last visit to derm about 2 months ago     HPI: Patient is a 79 y.o. Caucasian male who is here for some skin questions.  Has a spot he feels on his back but can't see and wants me to look at it.  Also has a crusty bump on left wrist/hand.  He has a hx of multiple AK lesions on arms that he has had frozen in the past.  Also pain in left great toe that began about a week ago.  Turned red on skin abutting the proximal nail.  The redness is spreading a little and he is noting more pain when he touches the area.  No fever.    REgarding his iron def anemia, he is here today for repeat CBC.  He is tolerating one FeSO4 325mg  per day.  Pertinent PMH:  Past Medical History  Diagnosis Date  . Hypertension   . Hyperlipidemia   . GERD (gastroesophageal reflux disease)   . DDD (degenerative disc disease)     L-spine  . Arthritis   . Cataract   . Anemia 02/2012    Mild iron deficiency  . Heme positive stool 02/2012    colonoscopy normal.  EGD showed small hiatal hernia with associated Cameron's erosions.   Past Surgical History  Procedure Date  . Appendectomy 1935  . Tonsillectomy 1935  . Colonoscopy 05/12/12    for iron def anemia--NORMAL RESULT  . Nasal sinus surgery     1998  . Cataract surg     Bilateral/ 08-13  . Esophagogastroduodenoscopy 06/17/12    Small hiatal hernia with associated Cameron's erosions--likely source of his mild iron def anemia.   +++ MEDS:  Outpatient Prescriptions Prior to Visit  Medication Sig Dispense Refill  . aspirin 81 MG tablet Take 81 mg by mouth daily.        . Cholecalciferol (VITAMIN D3) 2000 UNITS TABS Take by mouth daily.        Marland Kitchen FeFum-FePoly-FA-B  Cmp-C-Biot (INTEGRA PLUS) CAPS Take 1 capsule by mouth daily.  30 capsule  4  . losartan-hydrochlorothiazide (HYZAAR) 100-25 MG per tablet Take 1 tablet by mouth daily.  30 tablet  5  . multivitamin (THERAGRAN) per tablet Take 1 tablet by mouth daily.        Marland Kitchen omeprazole (PRILOSEC) 40 MG capsule Take 1 capsule (40 mg total) by mouth daily.  30 capsule  3  . pravastatin (PRAVACHOL) 80 MG tablet Take 1 tablet (80 mg total) by mouth daily.  90 tablet  0  . verapamil (CALAN-SR) 240 MG CR tablet Take 1 tablet (240 mg total) by mouth 2 (two) times daily.  180 tablet  0  . naproxen sodium (ANAPROX) 220 MG tablet Take 220 mg by mouth daily as needed.        . prednisoLONE acetate (PRED FORTE) 1 % ophthalmic suspension Place 1 drop into both eyes 2 (two) times daily.       Last reviewed on 10/05/2012 11:50 AM by Jeoffrey Massed, MD  PE: Blood pressure 140/90, pulse 79, temperature 97.7 F (36.5 C), temperature source Oral, height 5'  10" (1.778 m), weight 207 lb (93.895 kg), SpO2 96.00%. Gen: Alert, well appearing.  Patient is oriented to person, place, time, and situation. Skin: center of back in thoracic region he has a brown, irreg shaped, crusty "stuck on" lesion. On his left wrist/hand, dorsal surface, he has a crusty, flesh colored papule.  Left foot: great toe with blanching erythema of the skin proximal to the nail, no fluctuance.  No drainage around nail.  IMPRESSION AND PLAN:  Iron deficiency anemia EGD 05/2012 showed gastric erosions, H pylori neg. Colonoscopy normal. Has been taking FeSO4 325mg  qd and tolerates this fine. Continue current meds for this problem. Recheck CBC today.    ESSENTIAL HYPERTENSION, BENIGN Problem stable.  Continue current medications and diet appropriate for this condition.  We have reviewed our general long term plan for this problem and also reviewed symptoms and signs that should prompt the patient to call or return to the office.   HYPERLIPIDEMIA Lab  Results  Component Value Date   CHOL 157 03/10/2012   HDL 54.40 03/10/2012   LDLCALC 80 03/10/2012   LDLDIRECT 112.5 09/11/2011   TRIG 114.0 03/10/2012   CHOLHDL 3 03/10/2012   Continue statin. Recheck lipids 02/2013.   Paronychia of great toe of left foot Discussed warm water soaks bid. Keflex X 10d.  Skin lesion On his back he has a seb derm lesion--reassured pt of benign nature and no intervention needed. On his left wrist/hand he has a lesion suspicious for actinic keratoses.  I recommended he present to his dermatologist for freezing of this lesion like he has had done for many similar lesions in the past.   An After Visit Summary was printed and given to the patient.  F/u as already scheduled in about 1 mo.

## 2012-10-06 ENCOUNTER — Other Ambulatory Visit: Payer: Self-pay | Admitting: Family Medicine

## 2012-10-06 DIAGNOSIS — D509 Iron deficiency anemia, unspecified: Secondary | ICD-10-CM

## 2012-10-08 DIAGNOSIS — L989 Disorder of the skin and subcutaneous tissue, unspecified: Secondary | ICD-10-CM | POA: Insufficient documentation

## 2012-10-08 NOTE — Assessment & Plan Note (Signed)
Discussed warm water soaks bid. Keflex X 10d.

## 2012-10-08 NOTE — Assessment & Plan Note (Signed)
Problem stable.  Continue current medications and diet appropriate for this condition.  We have reviewed our general long term plan for this problem and also reviewed symptoms and signs that should prompt the patient to call or return to the office.  

## 2012-10-08 NOTE — Assessment & Plan Note (Addendum)
EGD 05/2012 showed gastric erosions, H pylori neg. Colonoscopy normal. Has been taking FeSO4 325mg  qd and tolerates this fine. Continue current meds for this problem. Recheck CBC today.

## 2012-10-08 NOTE — Assessment & Plan Note (Signed)
On his back he has a seb derm lesion--reassured pt of benign nature and no intervention needed. On his left wrist/hand he has a lesion suspicious for actinic keratoses.  I recommended he present to his dermatologist for freezing of this lesion like he has had done for many similar lesions in the past.

## 2012-10-08 NOTE — Assessment & Plan Note (Addendum)
Lab Results  Component Value Date   CHOL 157 03/10/2012   HDL 54.40 03/10/2012   LDLCALC 80 03/10/2012   LDLDIRECT 112.5 09/11/2011   TRIG 114.0 03/10/2012   CHOLHDL 3 03/10/2012   Continue statin. Recheck lipids 02/2013.

## 2012-11-03 ENCOUNTER — Other Ambulatory Visit (INDEPENDENT_AMBULATORY_CARE_PROVIDER_SITE_OTHER): Payer: Medicare Other

## 2012-11-03 DIAGNOSIS — D509 Iron deficiency anemia, unspecified: Secondary | ICD-10-CM | POA: Diagnosis not present

## 2012-11-03 LAB — HEMOCCULT SLIDES (X 3 CARDS)
Fecal Occult Blood: NEGATIVE
OCCULT 1: POSITIVE
OCCULT 2: POSITIVE
OCCULT 3: NEGATIVE
OCCULT 4: NEGATIVE
OCCULT 5: NEGATIVE

## 2012-11-10 DIAGNOSIS — L723 Sebaceous cyst: Secondary | ICD-10-CM | POA: Diagnosis not present

## 2012-11-10 DIAGNOSIS — D0460 Carcinoma in situ of skin of unspecified upper limb, including shoulder: Secondary | ICD-10-CM | POA: Diagnosis not present

## 2012-11-11 ENCOUNTER — Ambulatory Visit (INDEPENDENT_AMBULATORY_CARE_PROVIDER_SITE_OTHER): Payer: Medicare Other | Admitting: Family Medicine

## 2012-11-11 ENCOUNTER — Encounter: Payer: Self-pay | Admitting: Family Medicine

## 2012-11-11 VITALS — BP 145/82 | HR 69 | Temp 97.6°F | Ht 70.0 in | Wt 210.1 lb

## 2012-11-11 DIAGNOSIS — D509 Iron deficiency anemia, unspecified: Secondary | ICD-10-CM | POA: Diagnosis not present

## 2012-11-11 DIAGNOSIS — K219 Gastro-esophageal reflux disease without esophagitis: Secondary | ICD-10-CM

## 2012-11-11 DIAGNOSIS — L989 Disorder of the skin and subcutaneous tissue, unspecified: Secondary | ICD-10-CM

## 2012-11-11 DIAGNOSIS — R195 Other fecal abnormalities: Secondary | ICD-10-CM

## 2012-11-11 LAB — CBC WITH DIFFERENTIAL/PLATELET
Eosinophils Relative: 5.6 % — ABNORMAL HIGH (ref 0.0–5.0)
HCT: 37.9 % — ABNORMAL LOW (ref 39.0–52.0)
Hemoglobin: 12.5 g/dL — ABNORMAL LOW (ref 13.0–17.0)
Lymphs Abs: 1.2 10*3/uL (ref 0.7–4.0)
MCV: 95.9 fl (ref 78.0–100.0)
Monocytes Absolute: 0.7 10*3/uL (ref 0.1–1.0)
Monocytes Relative: 10.7 % (ref 3.0–12.0)
Neutro Abs: 4.2 10*3/uL (ref 1.4–7.7)
RDW: 14.6 % (ref 11.5–14.6)
WBC: 6.5 10*3/uL (ref 4.5–10.5)

## 2012-11-11 MED ORDER — RANITIDINE HCL 300 MG PO CAPS: 300.0000 mg | ORAL_CAPSULE | Freq: Every evening | ORAL | Status: DC

## 2012-11-11 NOTE — Progress Notes (Signed)
OFFICE NOTE  11/14/2012  CC:  Chief Complaint  Patient presents with  . Follow-up     HPI: Patient is a 79 y.o. Caucasian male who is here for 5 wk f/u GERD with hx of esoph erosions and iron def with mild anemia.  Reports feeling fine except he does admit to frequent reflux/heartburn.  He admits he has not adjusted his diet to avoid reflux-inducing foods, even though he displays good knowledge today of what those foods are. He is compliant with his iron and his prilosec.   Biggest dietary culprits on a chronic basis are: 3 large cups of strong coffee per day, lots of salads with onions and vinegar dressing, and he is a habitual overeater.  Pertinent PMH:  Past Medical History  Diagnosis Date  . Hypertension   . Hyperlipidemia   . GERD (gastroesophageal reflux disease)   . DDD (degenerative disc disease)     L-spine  . Arthritis   . Cataract   . Anemia 02/2012    Mild iron deficiency  . Heme positive stool 02/2012    colonoscopy normal.  EGD showed small hiatal hernia with associated Cameron's erosions.   Past surgical, social, and family history reviewed and no changes noted since last office visit.   MEDS:  **Not taking keflex, naproxen, or aspirin as listed below Outpatient Prescriptions Prior to Visit  Medication Sig Dispense Refill  . FeFum-FePoly-FA-B Cmp-C-Biot (INTEGRA PLUS) CAPS Take 1 capsule by mouth daily.  30 capsule  4  . losartan-hydrochlorothiazide (HYZAAR) 100-25 MG per tablet Take 1 tablet by mouth daily.  30 tablet  5  . multivitamin (THERAGRAN) per tablet Take 1 tablet by mouth daily.        . pravastatin (PRAVACHOL) 80 MG tablet Take 1 tablet (80 mg total) by mouth daily.  90 tablet  0  . prednisoLONE acetate (PRED FORTE) 1 % ophthalmic suspension Place 1 drop into both eyes 2 (two) times daily.       . verapamil (CALAN-SR) 240 MG CR tablet Take 1 tablet (240 mg total) by mouth 2 (two) times daily.  180 tablet  0  . naproxen sodium (ANAPROX) 220 MG tablet  Take 220 mg by mouth daily as needed.        . Cholecalciferol (VITAMIN D3) 2000 UNITS TABS Take by mouth daily.        Marland Kitchen omeprazole (PRILOSEC) 40 MG capsule Take 1 capsule (40 mg total) by mouth daily.  30 capsule  3  . aspirin 81 MG tablet Take 81 mg by mouth daily.        . cephALEXin (KEFLEX) 500 MG capsule Take 1 capsule (500 mg total) by mouth 3 (three) times daily.  30 capsule  0   No facility-administered medications prior to visit.    PE: Blood pressure 145/82, pulse 69, temperature 97.6 F (36.4 C), temperature source Temporal, height 5\' 10"  (1.778 m), weight 210 lb 1.9 oz (95.31 kg), SpO2 94.00%. Gen: Alert, well appearing.  Patient is oriented to person, place, time, and situation. No pallor or jaundice. Neck - No masses or thyromegaly or limitation in range of motion CV: RRR, no m/r/g.   LUNGS: CTA bilat, nonlabored resps, good aeration in all lung fields. ABD: soft, NT, ND, BS normal.  No hepatospenomegaly or mass.  No bruits. EXT: trace pitting edema, no cyanosis or clubbing.  IMPRESSION AND PLAN:  Iron deficiency anemia From chronic, slow upper GI bleeding. This apparently has still not healed over the last  5 mo with daily omeprazole 40mg .   Continue iron once daily, recheck CBC today.  He'll stay off his ASA and any NSAIDs until we document stability of Hb, restoration of iron stores, and repeatedly negative hemoccults over at least 3 mo.  GERD (gastroesophageal reflux disease) He still has a lot of work to do with adjusting his diet, plus I've recommended he try elevating head of bed again. Continue prilosec 40mg  qd and add ranitidine 300 mg qhs.  Skin lesion Recently saw Derm (Dr. Terri Piedra) and got left hand skin lesion removed and sent to path.  He was reassured by the dermatologist of the benign nature all of his other current skin lesions present.   An After Visit Summary was printed and given to the patient.  FOLLOW UP: 6 wks

## 2012-11-14 DIAGNOSIS — K219 Gastro-esophageal reflux disease without esophagitis: Secondary | ICD-10-CM | POA: Insufficient documentation

## 2012-11-14 NOTE — Assessment & Plan Note (Signed)
Recently saw Derm (Dr. Terri Piedra) and got left hand skin lesion removed and sent to path.  He was reassured by the dermatologist of the benign nature all of his other current skin lesions present.

## 2012-11-14 NOTE — Assessment & Plan Note (Signed)
He still has a lot of work to do with adjusting his diet, plus I've recommended he try elevating head of bed again. Continue prilosec 40mg  qd and add ranitidine 300 mg qhs.

## 2012-11-14 NOTE — Assessment & Plan Note (Addendum)
From chronic, slow upper GI bleeding. This apparently has still not healed over the last 5 mo with daily omeprazole 40mg .   Continue iron once daily, recheck CBC today.  He'll stay off his ASA and any NSAIDs until we document stability of Hb, restoration of iron stores, and repeatedly negative hemoccults over at least 3 mo.

## 2012-12-03 ENCOUNTER — Other Ambulatory Visit: Payer: Self-pay | Admitting: Family Medicine

## 2012-12-07 NOTE — Telephone Encounter (Signed)
eScribe request for refill on LOSARTAN-HCTZ Last filled - 06/09/12, #30 X 5 Last seen on - 11/11/12 Follow up - 12/10/12 RX sent

## 2012-12-10 ENCOUNTER — Encounter: Payer: Self-pay | Admitting: Family Medicine

## 2012-12-10 ENCOUNTER — Ambulatory Visit (INDEPENDENT_AMBULATORY_CARE_PROVIDER_SITE_OTHER): Payer: Medicare Other | Admitting: Family Medicine

## 2012-12-10 VITALS — BP 152/88 | HR 72 | Ht 70.0 in | Wt 210.0 lb

## 2012-12-10 DIAGNOSIS — D509 Iron deficiency anemia, unspecified: Secondary | ICD-10-CM

## 2012-12-10 LAB — CBC WITH DIFFERENTIAL/PLATELET
Basophils Relative: 0.4 % (ref 0.0–3.0)
Eosinophils Absolute: 0.3 10*3/uL (ref 0.0–0.7)
Hemoglobin: 13.4 g/dL (ref 13.0–17.0)
MCHC: 33.8 g/dL (ref 30.0–36.0)
MCV: 95.8 fl (ref 78.0–100.0)
Monocytes Absolute: 0.6 10*3/uL (ref 0.1–1.0)
Neutro Abs: 4.8 10*3/uL (ref 1.4–7.7)
RBC: 4.16 Mil/uL — ABNORMAL LOW (ref 4.22–5.81)

## 2012-12-10 NOTE — Progress Notes (Signed)
OFFICE NOTE  12/10/2012  CC:  Chief Complaint  Patient presents with  . Follow-up    GERD, occult GI bleed, iron def anemia     HPI: Patient is a 79 y.o. Caucasian male who is here for f/u GERD with esoph erosions causing chronic iron def. "I feel pretty good, as usual". One mo ago I added bedtime ranitidine and stressed dietary changes to help GERD.  Overall he feels unchanged. He cut back on coffee and overeating.  He denies pain but does say his stools are blackish/pasty--says it does this with taking iron pills.  Not taking pepto.  Pertinent PMH:  Past Medical History  Diagnosis Date  . Hypertension   . Hyperlipidemia   . GERD (gastroesophageal reflux disease)   . DDD (degenerative disc disease)     L-spine  . Arthritis   . Cataract   . Anemia 02/2012    Mild iron deficiency  . Heme positive stool 02/2012    colonoscopy normal.  EGD showed small hiatal hernia with associated Cameron's erosions.    MEDS:  Outpatient Prescriptions Prior to Visit  Medication Sig Dispense Refill  . Cholecalciferol (VITAMIN D3) 2000 UNITS TABS Take by mouth daily.        Marland Kitchen FeFum-FePoly-FA-B Cmp-C-Biot (INTEGRA PLUS) CAPS Take 1 capsule by mouth daily.  30 capsule  4  . losartan-hydrochlorothiazide (HYZAAR) 100-25 MG per tablet TAKE 1 TABLET BY MOUTH DAILY.  30 tablet  5  . multivitamin (THERAGRAN) per tablet Take 1 tablet by mouth daily.        Marland Kitchen omeprazole (PRILOSEC) 40 MG capsule Take 1 capsule (40 mg total) by mouth daily.  30 capsule  3  . pravastatin (PRAVACHOL) 80 MG tablet Take 1 tablet (80 mg total) by mouth daily.  90 tablet  0  . ranitidine (ZANTAC) 300 MG capsule Take 1 capsule (300 mg total) by mouth every evening.  30 capsule  3  . verapamil (CALAN-SR) 240 MG CR tablet Take 1 tablet (240 mg total) by mouth 2 (two) times daily.  180 tablet  0  . fluocinonide cream (LIDEX) 0.05 %       . prednisoLONE acetate (PRED FORTE) 1 % ophthalmic suspension Place 1 drop into both eyes 2  (two) times daily.        No facility-administered medications prior to visit.    PE: Blood pressure 152/88, pulse 72, height 5\' 10"  (1.778 m), weight 210 lb (95.255 kg). Gen: Alert, well appearing.  Patient is oriented to person, place, time, and situation. No pallor. ABD: nontender.  IMPRESSION AND PLAN:  Iron deficiency anemia Secondary to severe GERD with esoph erosions. Hb pretty stable.  Compliant with iron, PPI, and H2 blocker, trying somewhat with diet adjustments. Will check CBC today.  Last hemoccults 10/2012 were negative. Will recheck these again at f/u in 3 mo.  If neg at that time and if Hb stable and iron stores up a reasonable amount then will allow him to restart 81mg  ASA qd.   An After Visit Summary was printed and given to the patient.  FOLLOW UP: 27mo

## 2012-12-10 NOTE — Assessment & Plan Note (Signed)
Secondary to severe GERD with esoph erosions. Hb pretty stable.  Compliant with iron, PPI, and H2 blocker, trying somewhat with diet adjustments. Will check CBC today.  Last hemoccults 10/2012 were negative. Will recheck these again at f/u in 3 mo.  If neg at that time and if Hb stable and iron stores up a reasonable amount then will allow him to restart 81mg  ASA qd.

## 2013-01-19 ENCOUNTER — Other Ambulatory Visit: Payer: Self-pay | Admitting: Family Medicine

## 2013-01-19 NOTE — Telephone Encounter (Signed)
Rx request to pharmacy/[facility]  

## 2013-01-29 ENCOUNTER — Other Ambulatory Visit: Payer: Self-pay | Admitting: *Deleted

## 2013-01-29 MED ORDER — PRAVASTATIN SODIUM 80 MG PO TABS: 80.0000 mg | ORAL_TABLET | Freq: Every day | ORAL | Status: DC

## 2013-01-29 NOTE — Telephone Encounter (Signed)
Rx request to pharmacy/[facility]  

## 2013-02-18 ENCOUNTER — Other Ambulatory Visit: Payer: Self-pay | Admitting: Family Medicine

## 2013-02-18 NOTE — Telephone Encounter (Signed)
Rx request to pharmacy/[facility]  

## 2013-03-11 ENCOUNTER — Encounter: Payer: Self-pay | Admitting: Family Medicine

## 2013-03-11 ENCOUNTER — Ambulatory Visit (INDEPENDENT_AMBULATORY_CARE_PROVIDER_SITE_OTHER): Payer: Medicare Other | Admitting: Family Medicine

## 2013-03-11 VITALS — BP 150/87 | HR 63 | Temp 97.5°F | Resp 18 | Wt 213.5 lb

## 2013-03-11 DIAGNOSIS — D509 Iron deficiency anemia, unspecified: Secondary | ICD-10-CM

## 2013-03-11 DIAGNOSIS — K219 Gastro-esophageal reflux disease without esophagitis: Secondary | ICD-10-CM

## 2013-03-11 DIAGNOSIS — I10 Essential (primary) hypertension: Secondary | ICD-10-CM

## 2013-03-11 LAB — CBC WITH DIFFERENTIAL/PLATELET
Basophils Absolute: 0 10*3/uL (ref 0.0–0.1)
Eosinophils Absolute: 0.3 10*3/uL (ref 0.0–0.7)
Hemoglobin: 13.1 g/dL (ref 13.0–17.0)
Lymphocytes Relative: 21.7 % (ref 12.0–46.0)
MCHC: 33 g/dL (ref 30.0–36.0)
MCV: 99.3 fl (ref 78.0–100.0)
Monocytes Absolute: 0.8 10*3/uL (ref 0.1–1.0)
Neutro Abs: 3.9 10*3/uL (ref 1.4–7.7)
Neutrophils Relative %: 60.3 % (ref 43.0–77.0)
RDW: 14.4 % (ref 11.5–14.6)

## 2013-03-11 LAB — IBC PANEL: Saturation Ratios: 25.1 % (ref 20.0–50.0)

## 2013-03-11 NOTE — Progress Notes (Signed)
OFFICE NOTE  03/11/2013  CC:  Chief Complaint  Patient presents with  . Follow-up    3-mth. [GERD; Iron Deficiency Anemia]  . Medication Management    Discuss if need to restart daily ASA regimen  . Medication Problem    Pt reports that Rx is making him nauseated, believed to be BP medication.     HPI: Patient is a 79 y.o. Caucasian male who is here for f/u iron def anemia secondary to esophageal erosions from severe GERD. Last Hb was fine, hemoccults neg 10/2010.  I asked him to continue one iron tab a day after last visit 11/2012 but he says he didn'te do this but thinks he took it 2 more months. Describes brown stools. Says his GERD still affects him but is MUCH improved compared to his past.  He asks if he can stay on just prilosec and stop ranitidine now. He says the verapamil tastes terrible but he manages ok as long as he eats right away.  He did not take his bp med today. He says his appetite is good.  Pertinent PMH:  Past Medical History  Diagnosis Date  . Hypertension   . Hyperlipidemia   . GERD (gastroesophageal reflux disease)   . DDD (degenerative disc disease)     L-spine  . Arthritis   . Cataract   . Anemia 02/2012    Mild iron deficiency  . Heme positive stool 02/2012    colonoscopy normal.  EGD showed small hiatal hernia with associated Cameron's erosions.   Past surgical, social, and family history reviewed and no changes noted since last office visit.  MEDS:  Outpatient Prescriptions Prior to Visit  Medication Sig Dispense Refill  . Cholecalciferol (VITAMIN D3) 2000 UNITS TABS Take by mouth daily.        Marland Kitchen FeFum-FePoly-FA-B Cmp-C-Biot (INTEGRA PLUS) CAPS Take 1 capsule by mouth daily.  30 capsule  4  . multivitamin (THERAGRAN) per tablet Take 1 tablet by mouth daily.        Marland Kitchen omeprazole (PRILOSEC) 40 MG capsule TAKE 1 CAPSULE (40 MG TOTAL) BY MOUTH DAILY.  30 capsule  3  . pravastatin (PRAVACHOL) 80 MG tablet Take 1 tablet (80 mg total) by mouth daily.   90 tablet  0  . verapamil (CALAN-SR) 240 MG CR tablet TAKE 1 TABLET (240 MG TOTAL) BY MOUTH 2 (TWO) TIMES DAILY.  180 tablet  1  . losartan-hydrochlorothiazide (HYZAAR) 100-25 MG per tablet TAKE 1 TABLET BY MOUTH DAILY.  30 tablet  5  . ranitidine (ZANTAC) 300 MG capsule Take 1 capsule (300 mg total) by mouth every evening.  30 capsule  3   No facility-administered medications prior to visit.    PE: Blood pressure 150/87, pulse 63, temperature 97.5 F (36.4 C), temperature source Oral, resp. rate 18, weight 213 lb 8 oz (96.843 kg), SpO2 93.00%. Gen: Alert, well appearing.  Patient is oriented to person, place, time, and situation. SKIN: no pallor or jaundice CV: RRR, 2/6 systolic ejection murmur, without r/g. LUNGS: CTA bilat, nonlabored reps ABD: soft, NT, ND, BS normal.  No hepatospenomegaly or mass.  No bruits. EXT: no clubbing or cyanosis.  1+ pitting edema bilat with diffuse anterior tibial hemosiderin skin changes.  No erythema or tenderness.  IMPRESSION AND PLAN:  ESSENTIAL HYPERTENSION, BENIGN Problem stable.  Continue current medications and diet appropriate for this condition.  We have reviewed our general long term plan for this problem and also reviewed symptoms and signs that should prompt  the patient to call or return to the office. Encouraged him to take Calan and to EAT with each dose to avoid the nausea that he says it causes on an empty stomach.   Iron deficiency anemia Hx of upper GI bleed 05/2012. Compliance with oral iron replacement has been spotty at times.  He is currently NOT taking any. Repeat labs today + hemoccults.  GERD (gastroesophageal reflux disease) May d/c ranitidine now and continue prilosec every morning.   Try to adhere to GERD diet.   An After Visit Summary was printed and given to the patient.   FOLLOW UP: 38mo

## 2013-03-16 ENCOUNTER — Other Ambulatory Visit: Payer: Medicare Other

## 2013-03-16 DIAGNOSIS — D509 Iron deficiency anemia, unspecified: Secondary | ICD-10-CM

## 2013-03-16 LAB — HEMOCCULT SLIDES (X 3 CARDS)
Fecal Occult Blood: NEGATIVE
OCCULT 3: NEGATIVE
OCCULT 5: NEGATIVE

## 2013-03-17 NOTE — Progress Notes (Signed)
No zantac but he needs to stay on prilosec once every morning.-thx

## 2013-03-18 ENCOUNTER — Telehealth: Payer: Self-pay | Admitting: Family Medicine

## 2013-03-18 NOTE — Telephone Encounter (Signed)
Message copied by Eulah Pont on Thu Mar 18, 2013 10:28 AM ------      Message from: Jeoffrey Massed      Created: Tue Mar 16, 2013  2:49 PM       Pls notify pt that his hemoccult cards recently were all NEGATIVE.  No sign of bleeding from GI tract.  Continue taking iron like we discussed (one tab a day until told otherwise).-thx ------

## 2013-03-18 NOTE — Telephone Encounter (Signed)
May stop zantac. Needs to take prilosec every morning, though.-thx

## 2013-03-18 NOTE — Telephone Encounter (Signed)
Pt aware of results and will take the iron daily. Patient wants to know if he should continue to take zantac as well? He states he has one pill left. Please advise.

## 2013-03-18 NOTE — Telephone Encounter (Signed)
Lmom for patient with provider instructions to stop Zantac but take a daily prilosec.

## 2013-04-05 DIAGNOSIS — H5315 Visual distortions of shape and size: Secondary | ICD-10-CM | POA: Diagnosis not present

## 2013-04-06 NOTE — Assessment & Plan Note (Signed)
Hx of upper GI bleed 05/2012. Compliance with oral iron replacement has been spotty at times.  He is currently NOT taking any. Repeat labs today + hemoccults.

## 2013-04-06 NOTE — Assessment & Plan Note (Addendum)
Problem stable.  Continue current medications and diet appropriate for this condition.  We have reviewed our general long term plan for this problem and also reviewed symptoms and signs that should prompt the patient to call or return to the office. Encouraged him to take Calan and to EAT with each dose to avoid the nausea that he says it causes on an empty stomach.

## 2013-04-06 NOTE — Assessment & Plan Note (Signed)
May d/c ranitidine now and continue prilosec every morning.   Try to adhere to GERD diet.

## 2013-05-03 ENCOUNTER — Other Ambulatory Visit: Payer: Self-pay | Admitting: Family Medicine

## 2013-05-03 MED ORDER — PRAVASTATIN SODIUM 80 MG PO TABS: 80.0000 mg | ORAL_TABLET | Freq: Every day | ORAL | Status: DC

## 2013-05-24 ENCOUNTER — Ambulatory Visit (INDEPENDENT_AMBULATORY_CARE_PROVIDER_SITE_OTHER): Payer: Medicare Other | Admitting: Family Medicine

## 2013-05-24 ENCOUNTER — Encounter: Payer: Self-pay | Admitting: Family Medicine

## 2013-05-24 VITALS — BP 154/81 | HR 89 | Temp 99.2°F | Resp 20 | Ht 70.0 in | Wt 209.0 lb

## 2013-05-24 DIAGNOSIS — J309 Allergic rhinitis, unspecified: Secondary | ICD-10-CM | POA: Insufficient documentation

## 2013-05-24 MED ORDER — PREDNISONE 20 MG PO TABS: ORAL_TABLET | ORAL | Status: DC

## 2013-05-24 MED ORDER — FLUTICASONE PROPIONATE 50 MCG/ACT NA SUSP: NASAL | Status: DC

## 2013-05-24 NOTE — Progress Notes (Signed)
OFFICE NOTE  05/24/2013  CC:  Chief Complaint  Patient presents with  . Allergies    x Monday  . Eye Drainage     HPI: Patient is a 79 y.o. Caucasian male who is here for eye drainage/hay fever. Onset 1 wk ago. Some blood tinged mucous from nose briefly--lots of runny/congested nose (primarily right side), some HA when he blows his nose, eyes very itchy and runny lately. No ST, no ear sx's, no wheezing/cough, no SOB. [facility] allergy med OTC, pt can't recall name--"instructions said 1 per 24h). No fevers.  +redness around eyes.  He did not take his bp med today.   Pertinent PMH:  Past Medical History  Diagnosis Date  . Hypertension   . Hyperlipidemia   . GERD (gastroesophageal reflux disease)   . DDD (degenerative disc disease)     L-spine  . Arthritis   . Cataract   . Anemia 02/2012    Mild iron deficiency  . Heme positive stool 02/2012    colonoscopy normal.  EGD showed small hiatal hernia with associated Cameron's erosions.   Past surgical, social, and family history reviewed and no changes noted since last office visit.  MEDS:  Outpatient Prescriptions Prior to Visit  Medication Sig Dispense Refill  . Cholecalciferol (VITAMIN D3) 2000 UNITS TABS Take by mouth daily.        Marland Kitchen losartan-hydrochlorothiazide (HYZAAR) 100-25 MG per tablet TAKE 1 TABLET BY MOUTH DAILY.  30 tablet  5  . multivitamin (THERAGRAN) per tablet Take 1 tablet by mouth daily.        Marland Kitchen omeprazole (PRILOSEC) 40 MG capsule TAKE 1 CAPSULE (40 MG TOTAL) BY MOUTH DAILY.  30 capsule  3  . pravastatin (PRAVACHOL) 80 MG tablet Take 1 tablet (80 mg total) by mouth daily.  90 tablet  1  . verapamil (CALAN-SR) 240 MG CR tablet TAKE 1 TABLET (240 MG TOTAL) BY MOUTH 2 (TWO) TIMES DAILY.  180 tablet  1  . FeFum-FePoly-FA-B Cmp-C-Biot (INTEGRA PLUS) CAPS Take 1 capsule by mouth daily.  30 capsule  4   No facility-administered medications prior to visit.    PE: Blood pressure 154/81, pulse 89, temperature 99.2 F  (37.3 C), temperature source Temporal, resp. rate 20, height 5\' 10"  (1.778 m), weight 209 lb (94.802 kg), SpO2 95.00%.    BP recheck in exam room with manual cuff,right arm: 140/90.   VS: noted--normal. Gen: alert, NAD, NONTOXIC APPEARING. Both sides upper and lower eyelids with deep pink hue.  No bulbar conjunctival injection and no exudate. HEENT: eyes without injection, drainage, or swelling.  Ears: EACs clear, TMs with dullness bilat. Nose: Clear rhinorrhea, with some dried, crusty exudate adherent to mildly injected mucosa.  No purulent d/c.  Some right paranasal sinus pressure to palpation but nontender.  No facial swelling.  Throat and mouth without focal lesion.  No pharyngial swelling or erythema but some milkish PND is noted. Neck: supple, no LAD.   LUNGS: CTA bilat, nonlabored resps.   CV: RRR, no m/r/g. EXT: no c/c/e SKIN: no rash  LAB: none today  IMPRESSION AND PLAN:  Allergic rhinosinusitis.  Prednisone 40mg  qd x 5d, then 20mg  qd x 5d. Start daily flonase 2 sprays each nostril. OTC generic for zaditor eye drops recommended to use bid prn.  FOLLOW UP: pt to make 30 min appt at his convenience to discuss his "aches and pains" and his insomnia.

## 2013-05-24 NOTE — Patient Instructions (Addendum)
Buy OTC eye drops for your allergies: buy the store/generic brand for zaditor eye drops and use 2 drops in each eye twice a day as needed for eye itching and drainage.

## 2013-06-04 ENCOUNTER — Other Ambulatory Visit: Payer: Self-pay | Admitting: Family Medicine

## 2013-06-04 MED ORDER — OMEPRAZOLE 40 MG PO CPDR: DELAYED_RELEASE_CAPSULE | ORAL | Status: DC

## 2013-06-07 ENCOUNTER — Encounter: Payer: Self-pay | Admitting: Family Medicine

## 2013-06-07 ENCOUNTER — Ambulatory Visit (INDEPENDENT_AMBULATORY_CARE_PROVIDER_SITE_OTHER): Payer: Medicare Other | Admitting: Family Medicine

## 2013-06-07 VITALS — BP 131/76 | HR 80 | Temp 98.2°F | Resp 18 | Ht 70.0 in | Wt 212.0 lb

## 2013-06-07 DIAGNOSIS — Z23 Encounter for immunization: Secondary | ICD-10-CM

## 2013-06-07 DIAGNOSIS — G4700 Insomnia, unspecified: Secondary | ICD-10-CM | POA: Insufficient documentation

## 2013-06-07 DIAGNOSIS — R54 Age-related physical debility: Secondary | ICD-10-CM | POA: Insufficient documentation

## 2013-06-07 DIAGNOSIS — J309 Allergic rhinitis, unspecified: Secondary | ICD-10-CM

## 2013-06-07 DIAGNOSIS — R4181 Age-related cognitive decline: Secondary | ICD-10-CM

## 2013-06-07 DIAGNOSIS — J300 Vasomotor rhinitis: Secondary | ICD-10-CM | POA: Insufficient documentation

## 2013-06-07 MED ORDER — TRAZODONE HCL 50 MG PO TABS: ORAL_TABLET | ORAL | Status: DC

## 2013-06-07 MED ORDER — MONTELUKAST SODIUM 10 MG PO TABS: ORAL_TABLET | ORAL | Status: DC

## 2013-06-07 NOTE — Progress Notes (Signed)
OFFICE NOTE  06/07/2013  CC:  Chief Complaint  Patient presents with  . counseling    pt wants to discuss everything  . Insomnia     HPI: Patient is a 79 y.o. Caucasian male who is here for discussion of chronic aches/pains, also insomnia. Was here pretty recently for allergic rhinosinusitis sx's---feeling much better, just itchy eyes and stuffy nose some still.  Wants some skin spots looked at (has had a couple of SCC/AKs treated by derm in the past) and toenail looked at today (toenail simply fell off in not-too-distant past, and is growing back in now).  The areas of concern are areas that he cannot see himself so he would like me to just check them b/c I can see them.  For a long time now he has complained of the feeling of trouble walking b/c knees feel like they are going to give way, hips sometimes hurt.  Saw orthopedist and they gave him some exercises to do.  X-rays were unremarkable per pt report today. Denies disequilibrium or vertigo.  Occ has pain in lateral right hip region and this just comes and goes quickly, also both thumbs at MCP joint.  No joint swelling is ever noted.  He takes no meds for his various pains, nor does he want to take any.  Trouble sleeping past 1 yr or so, watches TV a lot late at night.  Gets 4-5 hours sleep or less, occ doesn't get any at all. Says he has never taken a sleeping pill but is willing to now.  Usually gets up 1X/night to urinate, head often feels stopped up. Drinks coffee in morning.  Drinks 6 cups coffee per day.  Pertinent PMH:  Past Medical History  Diagnosis Date  . Hypertension   . Hyperlipidemia   . GERD (gastroesophageal reflux disease)   . DDD (degenerative disc disease)     L-spine  . Arthritis   . Cataract   . Anemia 02/2012    Mild iron deficiency  . Heme positive stool 02/2012    colonoscopy normal.  EGD showed small hiatal hernia with associated Cameron's erosions.   Past Surgical History  Procedure Laterality Date   . Appendectomy  1935  . Tonsillectomy  1935  . Colonoscopy  05/12/12    for iron def anemia--NORMAL RESULT  . Nasal sinus surgery      1998  . Cataract surg      Bilateral/ 08-13  . Esophagogastroduodenoscopy  06/17/12    Small hiatal hernia with associated Cameron's erosions--likely source of his mild iron def anemia.   Past family and social history reviewed and there are no changes since the patient's last office visit with me.  MEDS:  Outpatient Prescriptions Prior to Visit  Medication Sig Dispense Refill  . Cholecalciferol (VITAMIN D3) 2000 UNITS TABS Take by mouth daily.        Marland Kitchen losartan-hydrochlorothiazide (HYZAAR) 100-25 MG per tablet TAKE 1 TABLET BY MOUTH DAILY.  30 tablet  5  . multivitamin (THERAGRAN) per tablet Take 1 tablet by mouth daily.        Marland Kitchen omeprazole (PRILOSEC) 40 MG capsule TAKE 1 CAPSULE (40 MG TOTAL) BY MOUTH DAILY.  30 capsule  3  . pravastatin (PRAVACHOL) 80 MG tablet Take 1 tablet (80 mg total) by mouth daily.  90 tablet  1  . verapamil (CALAN-SR) 240 MG CR tablet TAKE 1 TABLET (240 MG TOTAL) BY MOUTH 2 (TWO) TIMES DAILY.  180 tablet  1  .  FeFum-FePoly-FA-B Cmp-C-Biot (INTEGRA PLUS) CAPS Take 1 capsule by mouth daily.  30 capsule  4  . fluticasone (FLONASE) 50 MCG/ACT nasal spray 2 sprays each nostril once daily EVERY DAY--for hay fever  16 g  12  . predniSONE (DELTASONE) 20 MG tablet 2 tabs po qd x 5d, then 1 tab po qd x 5d  15 tablet  0   No facility-administered medications prior to visit.    PE: Blood pressure 131/76, pulse 80, temperature 98.2 F (36.8 C), temperature source Temporal, resp. rate 18, height 5\' 10"  (1.778 m), weight 212 lb (96.163 kg), SpO2 94.00%. Gen: Alert, well appearing.  Patient is oriented to person, place, time, and situation. AFFECT: pleasant, lucid thought and speech. Skin - no sores or suspicious lesions or rashes or color changes Left foot great toenail with proximal portion looking healthy and distal 1/2 eroded/poorly  filled in.  No discoloration, no flaking, no tenderness, no swelling. Knees: no erythema, warmth, swelling, tenderness, or instability.  IMPRESSION AND PLAN:  Insomnia Discussed sleep hygiene in detail with pt today:  Go to bed at the same time every night. Wake up at the same time every morning. NO daytime naps. Be as active as you can in the daytime (get out and walk, watch less TV and possibly do a bit less reading). Drink 2 cups of coffee in the morning but NONE after NOON (this goes for any beverage with caffeine in it)--work at this goal slowly to avoid withdrawal headaches. Don't eat within 2 hours of going to bed at night. Use your bed for sleep only--no lying in bed to read or watch TV. Have a nightly bedtime routine/ritual to teach your body to wind down in preparation for sleep.  In addition, will do trial of low dose sleep aid: trazodone 50mg , 1 tab po qhs prn, #15, RF x 1.  Therapeutic expectations and side effect profile of medication discussed today.  Patient's questions answered.    Allergic rhinitis Improved compared to last o/v, but sx's still bothersome (particularly at night). Add singulair 10mg  qhs.  Continue flonase daily.    Age-related physical debility Reassured patient that this is somewhat to be expected given his age and lack of physical activity. He has been unable to really do the necessary strengthening exercises at home (plus has lacked motivation).  He agreed to referral to PT for balance training and strengthening.  [facility] PT order placed today.   Reassured pt regarding his skin and his big toenail--no worrisome abnormalities noted.  FOLLOW UP: 1 mo

## 2013-06-07 NOTE — Assessment & Plan Note (Addendum)
Discussed sleep hygiene in detail with pt today:  Go to bed at the same time every night. Wake up at the same time every morning. NO daytime naps. Be as active as you can in the daytime (get out and walk, watch less TV and possibly do a bit less reading). Drink 2 cups of coffee in the morning but NONE after NOON (this goes for any beverage with caffeine in it)--work at this goal slowly to avoid withdrawal headaches. Don't eat within 2 hours of going to bed at night. Use your bed for sleep only--no lying in bed to read or watch TV. Have a nightly bedtime routine/ritual to teach your body to wind down in preparation for sleep.  In addition, will do trial of low dose sleep aid: trazodone 50mg , 1 tab po qhs prn, #15, RF x 1.  Therapeutic expectations and side effect profile of medication discussed today.  Patient's questions answered.

## 2013-06-07 NOTE — Patient Instructions (Addendum)
Go to bed at the same time every night. Wake up at the same time every morning. NO daytime naps. Be as active as you can in the daytime (get out and walk, watch less TV and possibly do a bit less reading). Drink 2 cups of coffee in the morning but NONE after NOON (this goes for any beverage with caffeine in it). Don't eat within 2 hours of going to bed at night. Use your bed for sleep only--no lying in bed to read or watch TV. Have a nightly bedtime routine/ritual to teach your body to wind down in preparation for sleep.

## 2013-06-07 NOTE — Assessment & Plan Note (Signed)
Reassured patient that this is somewhat to be expected given his age and lack of physical activity. He has been unable to really do the necessary strengthening exercises at home (plus has lacked motivation).  He agreed to referral to PT for balance training and strengthening.  [facility] PT order placed today.

## 2013-06-07 NOTE — Assessment & Plan Note (Signed)
Improved compared to last o/v, but sx's still bothersome (particularly at night). Add singulair 10mg  qhs.  Continue flonase daily.

## 2013-06-14 ENCOUNTER — Telehealth: Payer: Self-pay | Admitting: Family Medicine

## 2013-06-14 DIAGNOSIS — R262 Difficulty in walking, not elsewhere classified: Secondary | ICD-10-CM | POA: Diagnosis not present

## 2013-06-14 MED ORDER — LOSARTAN POTASSIUM-HCTZ 100-25 MG PO TABS: ORAL_TABLET | ORAL | Status: DC

## 2013-06-14 NOTE — Telephone Encounter (Signed)
Medication sent to pharmacy per [place] protocol.

## 2013-06-18 DIAGNOSIS — R262 Difficulty in walking, not elsewhere classified: Secondary | ICD-10-CM | POA: Diagnosis not present

## 2013-06-21 DIAGNOSIS — R262 Difficulty in walking, not elsewhere classified: Secondary | ICD-10-CM | POA: Diagnosis not present

## 2013-06-25 DIAGNOSIS — R262 Difficulty in walking, not elsewhere classified: Secondary | ICD-10-CM | POA: Diagnosis not present

## 2013-06-28 DIAGNOSIS — R262 Difficulty in walking, not elsewhere classified: Secondary | ICD-10-CM | POA: Diagnosis not present

## 2013-07-02 DIAGNOSIS — R262 Difficulty in walking, not elsewhere classified: Secondary | ICD-10-CM | POA: Diagnosis not present

## 2013-07-05 DIAGNOSIS — R262 Difficulty in walking, not elsewhere classified: Secondary | ICD-10-CM | POA: Diagnosis not present

## 2013-07-07 ENCOUNTER — Ambulatory Visit: Payer: Medicare Other | Admitting: Family Medicine

## 2013-07-08 ENCOUNTER — Encounter: Payer: Self-pay | Admitting: Family Medicine

## 2013-07-08 ENCOUNTER — Ambulatory Visit (INDEPENDENT_AMBULATORY_CARE_PROVIDER_SITE_OTHER): Payer: Medicare Other | Admitting: Family Medicine

## 2013-07-08 VITALS — BP 146/78 | HR 68 | Temp 99.6°F | Resp 18 | Ht 70.0 in | Wt 215.0 lb

## 2013-07-08 DIAGNOSIS — J309 Allergic rhinitis, unspecified: Secondary | ICD-10-CM

## 2013-07-08 DIAGNOSIS — G4700 Insomnia, unspecified: Secondary | ICD-10-CM

## 2013-07-08 DIAGNOSIS — J300 Vasomotor rhinitis: Secondary | ICD-10-CM

## 2013-07-08 DIAGNOSIS — R54 Age-related physical debility: Secondary | ICD-10-CM

## 2013-07-08 DIAGNOSIS — I10 Essential (primary) hypertension: Secondary | ICD-10-CM

## 2013-07-08 DIAGNOSIS — R4181 Age-related cognitive decline: Secondary | ICD-10-CM

## 2013-07-08 MED ORDER — AZELASTINE HCL 0.15 % NA SOLN: NASAL | Status: DC

## 2013-07-08 NOTE — Patient Instructions (Signed)
Take your blood pressure and heart rate once every other day and call to report these numbers to my nurse in about 2-3 weeks.

## 2013-07-08 NOTE — Assessment & Plan Note (Signed)
Continue flonase qAM but d/c singulair (no help). Add astepro 2 sprays each nostril qhs---demonstrated correct technique for taking this med today for pt.

## 2013-07-08 NOTE — Assessment & Plan Note (Signed)
Slightly improved with some small improvements in sleep hygiene. Trazodone no help---we'll stay off this med and no new insomnia med will be started at this time.

## 2013-07-08 NOTE — Assessment & Plan Note (Signed)
Improving gradually with PT lately.  Continue PT.

## 2013-07-08 NOTE — Assessment & Plan Note (Addendum)
Possible increased bp response to exercise reported today by pt---occurred at PT recently he recalls.  He denies feeling any chest pain, SOB, or dizziness or any other symptom. It apparently came down with rest and he was able to continue lower intensity exercises.  EKG today reassuring---no signs of ischemia or ectopy. Further pt instructions: Take your blood pressure and heart rate once every other day and call to report these numbers to my nurse in about 2-3 weeks. If bp needs a bit of extra med treatment I will add amlodipine or felodipine.

## 2013-07-08 NOTE — Progress Notes (Addendum)
OFFICE NOTE  07/08/2013  CC:  Chief Complaint  Patient presents with  . Insomnia    can't stay asleep     HPI: Patient is a 79 y.o. Caucasian male who is here for 1 mo f/u insomnia, vasomotor rhinitis vs allergic rhinitis, and chronic mild debilitation. Feels a bit stronger with the addition of PT relatively recently--goes 2 X/ week. Trazodone didn't help with sleep, but he feels like some of the sleep hygiene changes I suggested last visit have helped enough for him to get about 5 hours strait sleep most nights and he is ok with this. Says nasal congestion that comes on at night bothers his sleep some nights.  Says singulair has not changed his daytime or nighttime allergic rhinitis/vasomotor rhinitis sx's any.  He reports that he was told he had elevated bp at PT recently when he was doing one of the more vigorous activities they have for him.  He was slowed down and his bp came down.  He was told his face turned red but he denies feeling any different at all. He has a home bp cuff but has not been monitoring bp any lately.  Denies CP.  He is easily winded---chronic--even with just getting up and down out of chairs and moving across the room.  He has spent years being completely sedentary.  Pertinent PMH:  Past Medical History  Diagnosis Date  . Hypertension   . Hyperlipidemia   . GERD (gastroesophageal reflux disease)   . DDD (degenerative disc disease)     L-spine  . Arthritis   . Cataract   . Anemia 02/2012    Mild iron deficiency  . Heme positive stool 02/2012    colonoscopy normal.  EGD showed small hiatal hernia with associated Cameron's erosions.    MEDS:  Outpatient Prescriptions Prior to Visit  Medication Sig Dispense Refill  . Cholecalciferol (VITAMIN D3) 2000 UNITS TABS Take by mouth daily.        . fluticasone (FLONASE) 50 MCG/ACT nasal spray 2 sprays each nostril once daily EVERY DAY--for hay fever  16 g  12  . losartan-hydrochlorothiazide (HYZAAR) 100-25 MG per  tablet TAKE 1 TABLET BY MOUTH DAILY.  30 tablet  5  . multivitamin (THERAGRAN) per tablet Take 1 tablet by mouth daily.        Marland Kitchen omeprazole (PRILOSEC) 40 MG capsule TAKE 1 CAPSULE (40 MG TOTAL) BY MOUTH DAILY.  30 capsule  3  . pravastatin (PRAVACHOL) 80 MG tablet Take 1 tablet (80 mg total) by mouth daily.  90 tablet  1  . traZODone (DESYREL) 50 MG tablet 1 tab po qhs prn insomnia  15 tablet  1  . verapamil (CALAN-SR) 240 MG CR tablet TAKE 1 TABLET (240 MG TOTAL) BY MOUTH 2 (TWO) TIMES DAILY.  180 tablet  1  . FeFum-FePoly-FA-B Cmp-C-Biot (INTEGRA PLUS) CAPS Take 1 capsule by mouth daily.  30 capsule  4  . montelukast (SINGULAIR) 10 MG tablet 1 tab po qhs for allergies  30 tablet  6   No facility-administered medications prior to visit.    PE: Blood pressure 146/78, pulse 68, temperature 99.6 F (37.6 C), temperature source Temporal, resp. rate 18, height 5\' 10"  (1.778 m), weight 215 lb (97.523 kg), SpO2 96.00%. Gen: Alert, well appearing.  Patient is oriented to person, place, time, and situation. ENT: Ears: EACs clear, normal epithelium.  TMs with good light reflex and landmarks bilaterally.  Eyes: no injection, icteris, swelling, or exudate.  EOMI, PERRLA.  Nose: no drainage but mild turbinate erythema/edema noted (?polyp as well--on right side)  No injection or focal lesion.  Mouth: lips without lesion/swelling.  Oral mucosa pink and moist.   Oropharynx without erythema, exudate, or swelling.  CV: RRR, no m/r/g.   LUNGS: CTA bilat, nonlabored resps, good aeration in all lung fields.  LABS:  No labs 12 lead EKG today showed sinus rhythm with 1st deg AV block, RSR' in V1, poor R wave progression.   No hypertrophy, no ectopy.  No ST segment abnormalities or T wave abnormalties.  No Q waves.  NO OLD EKG AVAILABLE FOR COMPARISON  IMPRESSION AND PLAN:  Vasomotor rhinitis Continue flonase qAM but d/c singulair (no help). Add astepro 2 sprays each nostril qhs---demonstrated correct  technique for taking this med today for pt.  Insomnia Slightly improved with some small improvements in sleep hygiene. Trazodone no help---we'll stay off this med and no new insomnia med will be started at this time.  Age-related physical debility Improving gradually with PT lately.  Continue PT.  ESSENTIAL HYPERTENSION, BENIGN Possible increased bp response to exercise reported today by pt---occurred at PT recently he recalls.  He denies feeling any chest pain, SOB, or dizziness or any other symptom. It apparently came down with rest and he was able to continue lower intensity exercises.  EKG today reassuring---no signs of ischemia or ectopy. Further pt instructions: Take your blood pressure and heart rate once every other day and call to report these numbers to my nurse in about 2-3 weeks. If bp needs a bit of extra med treatment I will add amlodipine or felodipine.   An After Visit Summary was printed and given to the patient.  FOLLOW UP: 2 mo

## 2013-07-09 DIAGNOSIS — R262 Difficulty in walking, not elsewhere classified: Secondary | ICD-10-CM | POA: Diagnosis not present

## 2013-07-12 DIAGNOSIS — R262 Difficulty in walking, not elsewhere classified: Secondary | ICD-10-CM | POA: Diagnosis not present

## 2013-07-16 DIAGNOSIS — R262 Difficulty in walking, not elsewhere classified: Secondary | ICD-10-CM | POA: Diagnosis not present

## 2013-07-19 DIAGNOSIS — R262 Difficulty in walking, not elsewhere classified: Secondary | ICD-10-CM | POA: Diagnosis not present

## 2013-07-23 DIAGNOSIS — R262 Difficulty in walking, not elsewhere classified: Secondary | ICD-10-CM | POA: Diagnosis not present

## 2013-07-26 DIAGNOSIS — R262 Difficulty in walking, not elsewhere classified: Secondary | ICD-10-CM | POA: Diagnosis not present

## 2013-07-30 DIAGNOSIS — R262 Difficulty in walking, not elsewhere classified: Secondary | ICD-10-CM | POA: Diagnosis not present

## 2013-08-02 DIAGNOSIS — R262 Difficulty in walking, not elsewhere classified: Secondary | ICD-10-CM | POA: Diagnosis not present

## 2013-08-06 DIAGNOSIS — R262 Difficulty in walking, not elsewhere classified: Secondary | ICD-10-CM | POA: Diagnosis not present

## 2013-08-09 DIAGNOSIS — R262 Difficulty in walking, not elsewhere classified: Secondary | ICD-10-CM | POA: Diagnosis not present

## 2013-08-16 ENCOUNTER — Other Ambulatory Visit: Payer: Self-pay | Admitting: Family Medicine

## 2013-08-16 MED ORDER — VERAPAMIL HCL ER 240 MG PO TBCR: EXTENDED_RELEASE_TABLET | ORAL | Status: DC

## 2013-09-10 ENCOUNTER — Ambulatory Visit (INDEPENDENT_AMBULATORY_CARE_PROVIDER_SITE_OTHER): Payer: Medicare Other | Admitting: Family Medicine

## 2013-09-10 ENCOUNTER — Encounter: Payer: Self-pay | Admitting: Family Medicine

## 2013-09-10 VITALS — BP 137/75 | HR 71 | Temp 97.5°F | Ht 70.0 in | Wt 211.2 lb

## 2013-09-10 DIAGNOSIS — D509 Iron deficiency anemia, unspecified: Secondary | ICD-10-CM

## 2013-09-10 DIAGNOSIS — E785 Hyperlipidemia, unspecified: Secondary | ICD-10-CM

## 2013-09-10 DIAGNOSIS — J309 Allergic rhinitis, unspecified: Secondary | ICD-10-CM

## 2013-09-10 DIAGNOSIS — J300 Vasomotor rhinitis: Secondary | ICD-10-CM

## 2013-09-10 DIAGNOSIS — I10 Essential (primary) hypertension: Secondary | ICD-10-CM

## 2013-09-10 LAB — CBC WITH DIFFERENTIAL/PLATELET
Basophils Relative: 0.5 % (ref 0.0–3.0)
Eosinophils Relative: 3.6 % (ref 0.0–5.0)
HCT: 39.7 % (ref 39.0–52.0)
Lymphs Abs: 1.5 10*3/uL (ref 0.7–4.0)
MCV: 96.7 fl (ref 78.0–100.0)
Monocytes Absolute: 0.9 10*3/uL (ref 0.1–1.0)
Monocytes Relative: 9.2 % (ref 3.0–12.0)
Neutrophils Relative %: 70.8 % (ref 43.0–77.0)
Platelets: 357 10*3/uL (ref 150.0–400.0)
RBC: 4.1 Mil/uL — ABNORMAL LOW (ref 4.22–5.81)
WBC: 9.3 10*3/uL (ref 4.5–10.5)

## 2013-09-10 LAB — BASIC METABOLIC PANEL
CO2: 30 mEq/L (ref 19–32)
Chloride: 101 mEq/L (ref 96–112)
Creatinine, Ser: 1.1 mg/dL (ref 0.4–1.5)
Potassium: 4.7 mEq/L (ref 3.5–5.1)

## 2013-09-10 LAB — LIPID PANEL
Cholesterol: 197 mg/dL (ref 0–200)
LDL Cholesterol: 111 mg/dL — ABNORMAL HIGH (ref 0–99)
Total CHOL/HDL Ratio: 3
Triglycerides: 126 mg/dL (ref 0.0–149.0)
VLDL: 25.2 mg/dL (ref 0.0–40.0)

## 2013-09-10 LAB — FERRITIN: Ferritin: 30 ng/mL (ref 22.0–322.0)

## 2013-09-10 NOTE — Progress Notes (Signed)
Pre-visit discussion using our clinic review tool. No additional management support is needed unless otherwise documented below in the visit note.  OFFICE NOTE  09/10/2013  CC:  Chief Complaint  Patient presents with  . Follow-up    2 months     HPI: Patient is a 79 y.o. Caucasian male who is here for 2 month follow up for vasomotor rhinitis and HTN and insomnia. Astepro helping.  Sleep is better. Says home bp's are similar to what we got here today (130s/70s) Says overall he's feeling well.  He has had only a cup of black coffee today.  Pertinent PMH:  Past Medical History  Diagnosis Date  . Hypertension   . Hyperlipidemia   . GERD (gastroesophageal reflux disease)   . DDD (degenerative disc disease)     L-spine  . Arthritis   . Cataract   . Anemia 02/2012    Mild iron deficiency  . Heme positive stool 02/2012    colonoscopy normal.  EGD showed small hiatal hernia with associated Cameron's erosions.   Past surgical, social, and family history reviewed and no changes noted since last office visit.  MEDS:  Outpatient Prescriptions Prior to Visit  Medication Sig Dispense Refill  . Azelastine HCl (ASTEPRO) 0.15 % SOLN 2 sprays in each nostril at bedtime every night  30 mL  11  . Cholecalciferol (VITAMIN D3) 2000 UNITS TABS Take by mouth daily.        Marland Kitchen FeFum-FePoly-FA-B Cmp-C-Biot (INTEGRA PLUS) CAPS Take 1 capsule by mouth daily.  30 capsule  4  . losartan-hydrochlorothiazide (HYZAAR) 100-25 MG per tablet TAKE 1 TABLET BY MOUTH DAILY.  30 tablet  5  . multivitamin (THERAGRAN) per tablet Take 1 tablet by mouth daily.        Marland Kitchen omeprazole (PRILOSEC) 40 MG capsule TAKE 1 CAPSULE (40 MG TOTAL) BY MOUTH DAILY.  30 capsule  3  . pravastatin (PRAVACHOL) 80 MG tablet Take 1 tablet (80 mg total) by mouth daily.  90 tablet  1  . verapamil (CALAN-SR) 240 MG CR tablet TAKE 1 TABLET (240 MG TOTAL) BY MOUTH 2 (TWO) TIMES DAILY.  180 tablet  1  . fluticasone (FLONASE) 50 MCG/ACT nasal  spray 2 sprays each nostril once daily EVERY DAY--for hay fever  16 g  12   No facility-administered medications prior to visit.    PE: Blood pressure 137/75, pulse 71, temperature 97.5 F (36.4 C), temperature source Temporal, height 5\' 10"  (1.778 m), weight 211 lb 4 oz (95.822 kg), SpO2 96.00%. Gen: Alert, well appearing.  Patient is oriented to person, place, time, and situation. No pallor or jaundice.   AFFECT: pleasant.  Displays lucid thought and speech. No further exam today.  IMPRESSION AND PLAN:  1) HTN, stable.  The current medical regimen is effective;  continue present plan and medications. BMET today.  2) Vasomotor rhinitis: improved.  3) Insomnia: improving, esp with his nose being opened up more at night.  4) Hx of chronic knee instability: improved with PT but still needs to restrict activities. He has no problem doing this b/c by his own admission he is "lazy" and when told to walk daily to help his overall health he says "I'll try".  5) Hyperlipidemia.  Tolerating pravastatin.  Due for FLP today.  6) Hx of iron def anemia: GI source sought but none found.  This has corrected and has been stable with iron supplement and we'll see how his Hb is doing today.  FOLLOW UP:  50mo

## 2013-10-11 ENCOUNTER — Other Ambulatory Visit: Payer: Self-pay | Admitting: Family Medicine

## 2013-10-11 MED ORDER — OMEPRAZOLE 40 MG PO CPDR: DELAYED_RELEASE_CAPSULE | ORAL | Status: DC

## 2013-10-14 DIAGNOSIS — L738 Other specified follicular disorders: Secondary | ICD-10-CM | POA: Diagnosis not present

## 2013-10-14 DIAGNOSIS — L678 Other hair color and hair shaft abnormalities: Secondary | ICD-10-CM | POA: Diagnosis not present

## 2013-10-14 DIAGNOSIS — L570 Actinic keratosis: Secondary | ICD-10-CM | POA: Diagnosis not present

## 2013-10-25 ENCOUNTER — Encounter: Payer: Self-pay | Admitting: Family Medicine

## 2013-11-04 ENCOUNTER — Other Ambulatory Visit: Payer: Self-pay | Admitting: Family Medicine

## 2013-11-04 MED ORDER — PRAVASTATIN SODIUM 80 MG PO TABS: 80.0000 mg | ORAL_TABLET | Freq: Every day | ORAL | Status: DC

## 2014-01-06 ENCOUNTER — Other Ambulatory Visit: Payer: Self-pay | Admitting: Family Medicine

## 2014-02-09 ENCOUNTER — Other Ambulatory Visit: Payer: Self-pay | Admitting: Family Medicine

## 2014-03-10 ENCOUNTER — Ambulatory Visit (INDEPENDENT_AMBULATORY_CARE_PROVIDER_SITE_OTHER): Payer: Medicare Other | Admitting: Family Medicine

## 2014-03-10 ENCOUNTER — Encounter: Payer: Self-pay | Admitting: Family Medicine

## 2014-03-10 VITALS — BP 128/74 | HR 66 | Temp 98.2°F | Resp 18 | Ht 70.0 in | Wt 211.0 lb

## 2014-03-10 DIAGNOSIS — R54 Age-related physical debility: Secondary | ICD-10-CM

## 2014-03-10 DIAGNOSIS — I10 Essential (primary) hypertension: Secondary | ICD-10-CM | POA: Diagnosis not present

## 2014-03-10 DIAGNOSIS — R55 Syncope and collapse: Secondary | ICD-10-CM | POA: Diagnosis not present

## 2014-03-10 DIAGNOSIS — Z862 Personal history of diseases of the blood and blood-forming organs and certain disorders involving the immune mechanism: Secondary | ICD-10-CM | POA: Diagnosis not present

## 2014-03-10 DIAGNOSIS — E785 Hyperlipidemia, unspecified: Secondary | ICD-10-CM

## 2014-03-10 DIAGNOSIS — R42 Dizziness and giddiness: Secondary | ICD-10-CM | POA: Diagnosis not present

## 2014-03-10 DIAGNOSIS — R279 Unspecified lack of coordination: Secondary | ICD-10-CM

## 2014-03-10 DIAGNOSIS — R270 Ataxia, unspecified: Secondary | ICD-10-CM | POA: Insufficient documentation

## 2014-03-10 DIAGNOSIS — R4181 Age-related cognitive decline: Secondary | ICD-10-CM

## 2014-03-10 LAB — BASIC METABOLIC PANEL
BUN: 21 mg/dL (ref 6–23)
CO2: 31 mEq/L (ref 19–32)
CREATININE: 1.1 mg/dL (ref 0.4–1.5)
Calcium: 9.4 mg/dL (ref 8.4–10.5)
Chloride: 102 mEq/L (ref 96–112)
GFR: 68.4 mL/min (ref >60.00)
Glucose, Bld: 141 mg/dL — ABNORMAL HIGH (ref 70–99)
Potassium: 3.4 mEq/L — ABNORMAL LOW (ref 3.5–5.1)
Sodium: 138 mEq/L (ref 135–145)

## 2014-03-10 LAB — CBC WITH DIFFERENTIAL/PLATELET
BASOS PCT: 0.4 % (ref 0.0–3.0)
Basophils Absolute: 0 10*3/uL (ref 0.0–0.1)
EOS ABS: 0.2 10*3/uL (ref 0.0–0.7)
Eosinophils Relative: 3.2 % (ref 0.0–5.0)
HCT: 39.4 % (ref 39.0–52.0)
Hemoglobin: 13.2 g/dL (ref 13.0–17.0)
Lymphocytes Relative: 16.1 % (ref 12.0–46.0)
Lymphs Abs: 1 10*3/uL (ref 0.7–4.0)
MCHC: 33.5 g/dL (ref 30.0–36.0)
MCV: 98.4 fl (ref 78.0–100.0)
MONO ABS: 0.5 10*3/uL (ref 0.1–1.0)
Monocytes Relative: 8 % (ref 3.0–12.0)
Neutro Abs: 4.7 10*3/uL (ref 1.4–7.7)
Neutrophils Relative %: 72.3 % (ref 43.0–77.0)
Platelets: 347 10*3/uL (ref 150.0–400.0)
RBC: 4.01 Mil/uL — AB (ref 4.22–5.81)
RDW: 14.6 % (ref 11.5–15.5)
WBC: 6.5 10*3/uL (ref 4.0–10.5)

## 2014-03-10 LAB — FERRITIN: Ferritin: 36.2 ng/mL (ref 22.0–322.0)

## 2014-03-10 MED ORDER — AZELASTINE HCL 0.15 % NA SOLN: NASAL | Status: DC

## 2014-03-10 MED ORDER — LOSARTAN POTASSIUM-HCTZ 100-25 MG PO TABS: 1.0000 | ORAL_TABLET | Freq: Every day | ORAL | Status: DC

## 2014-03-10 MED ORDER — OMEPRAZOLE 40 MG PO CPDR: 40.0000 mg | DELAYED_RELEASE_CAPSULE | Freq: Every day | ORAL | Status: DC

## 2014-03-10 NOTE — Assessment & Plan Note (Signed)
The current medical regimen is effective;  continue present plan and medications. FLP at next f/u in 6 mo.

## 2014-03-10 NOTE — Assessment & Plan Note (Signed)
Problem stable.  Continue current medications and diet appropriate for this condition.  We have reviewed our general long term plan for this problem and also reviewed symptoms and signs that should prompt the patient to call or return to the office.  

## 2014-03-10 NOTE — Progress Notes (Signed)
OFFICE NOTE  03/10/2014  CC:  Chief Complaint  Patient presents with  . Follow-up    6 month     HPI: Patient is a 80 y.o. Caucasian male who is here for 6 mo f/u HTN, GERD, hx of mild iron def anemia with heme + stool. Compliant with meds.  Needs astelin refilled, nasal allergies bothering him some the last week and he's out of his spray.  He is worried about his history of knee instability more today than in past visits, mainly due to reading in "old people magazines" about stroke.  He is worried that he possibly had a stroke in the past and this has made his walking unstable.  When talking to him today it seems that he does not feel a disequilibrium or unbalanced feeling in his head when he walks, but I can't be sure b/c he is somewhat of a difficult historian.  In the past he has described his problem as his knees feeling unstable and like he may fall when he walks due to this knee instability.  He has had some PT for this but it doesn't seem to have helped much, if any.  He recalls one episode of syncope in 2009: says he felt very dizzy and became nauseated, then walked out to his car and opened the door to get in, then remembers waking up on the ground--no recall of how he got there.  No loss of bowel/bladder.  He doesn't recall if this was witnessed or not.  Nothing similar has happened since then.  He reports occasional brief periods of dizziness, possibly posturally related but he is unsure/unclear.  Denies HAs, vision complaints, hearing complaints, focal weakness, swallowing problems, speech problems, or paresthesias.  Pertinent PMH:  Past medical, surgical, social, and family history reviewed and no changes are noted since last office visit.  MEDS: OTC iron pill Outpatient Prescriptions Prior to Visit  Medication Sig Dispense Refill  . Cholecalciferol (VITAMIN D3) 2000 UNITS TABS Take by mouth daily.        . multivitamin (THERAGRAN) per tablet Take 1 tablet by mouth  daily.        . pravastatin (PRAVACHOL) 80 MG tablet Take 1 tablet (80 mg total) by mouth daily.  90 tablet  1  . verapamil (CALAN-SR) 240 MG CR tablet TAKE 1 TABLET (240 MG TOTAL) BY MOUTH 2 (TWO) TIMES DAILY.  180 tablet  1  . losartan-hydrochlorothiazide (HYZAAR) 100-25 MG per tablet TAKE 1 TABLET BY MOUTH EVERY DAY  30 tablet  3  . omeprazole (PRILOSEC) 40 MG capsule TAKE 1 CAPSULE (40 MG TOTAL) BY MOUTH DAILY.  30 capsule  3  . Azelastine HCl (ASTEPRO) 0.15 % SOLN 2 sprays in each nostril at bedtime every night  30 mL  11  . FeFum-FePoly-FA-B Cmp-C-Biot (INTEGRA PLUS) CAPS Take 1 capsule by mouth daily.  30 capsule  4   No facility-administered medications prior to visit.    PE: Blood pressure 128/74, pulse 66, temperature 98.2 F (36.8 C), temperature source Temporal, resp. rate 18, height 5\' 10"  (1.778 m), weight 211 lb (95.709 kg), SpO2 95.00%. Gen: Alert, frail and elderly but well appearing.  Patient is oriented to person, place, time, and situation. AFFECT: pleasant, lucid thought and speech. ENT: Ears: EACs clear, normal epithelium.  TMs with good light reflex and landmarks bilaterally.  Eyes: no injection, icteris, swelling, or exudate.  EOMI, PERRLA. Nose: no drainage or turbinate edema/swelling.  No injection or focal lesion.  Mouth:  lips without lesion/swelling.  Oral mucosa pink and moist.  Dentition intact and without obvious caries or gingival swelling.  Oropharynx without erythema, exudate, or swelling.  Neck: supple/nontender.  No LAD, mass, or TM.  Carotid pulses 2+ bilaterally, without bruits. CV: RRR, soft systolic flow murmur, without rub or gallop.  No diastolic murmur. Chest is clear, no wheezing or rales. Normal symmetric air entry throughout both lung fields. No chest wall deformities or tenderness. ABD: soft, NT EXT: lots of varicosities in LE's, minimal/trace pretibial pitting edema.  Feet sensation (fine touch) intact. Neuro: CN 2-12 intact bilaterally, strength  5/5 in proximal and distal upper extremities and lower extremities bilaterally.    No tremor.  Gait: he walks with a mildly broad-based gait, without veering.  Upper extremity and lower extremity DTRs symmetric.  No pronator drift.  Babinski testing elicited no extensor or flexor response in either foot.  Romberg negative.  LAB: none today  Recent labs:  Lab Results  Component Value Date   CHOL 197 09/10/2013   HDL 61.10 09/10/2013   LDLCALC 111* 09/10/2013   LDLDIRECT 112.5 09/11/2011   TRIG 126.0 09/10/2013   CHOLHDL 3 09/10/2013    IMPRESSION AND PLAN:  Ataxia With occ brief spells of dizziness, one unexplained episode of syncope in the remote past. I am unable to confidently say that he is not having ataxia due to a neurologic problem such as intracranial mass, hydrocephalus, or hx of cerebellar or brainstem lacunar infarcts. Neuro exam reassuring.  Will further eval with MRI brain w w/o contrast.  Pt in agreement with plan.   Age-related physical debility I think this, coupled with his bilateral knee osteoarthritis, is the main reason for his gait instability. I encouraged him to become more active and he said he is thinking about starting to go on walks and build upon that if he can.  ESSENTIAL HYPERTENSION, BENIGN Problem stable.  Continue current medications and diet appropriate for this condition.  We have reviewed our general long term plan for this problem and also reviewed symptoms and signs that should prompt the patient to call or return to the office.   HYPERLIPIDEMIA The current medical regimen is effective;  continue present plan and medications. FLP at next f/u in 6 mo.  History of iron deficiency anemia Recheck CBC and ferritin today. Continue once daily OTC iron tab.   An After Visit Summary was printed and given to the patient.  FOLLOW UP: 48mo-will do FLP at that time

## 2014-03-10 NOTE — Assessment & Plan Note (Signed)
Recheck CBC and ferritin today. Continue once daily OTC iron tab.

## 2014-03-10 NOTE — Assessment & Plan Note (Signed)
I think this, coupled with his bilateral knee osteoarthritis, is the main reason for his gait instability. I encouraged him to become more active and he said he is thinking about starting to go on walks and build upon that if he can.

## 2014-03-10 NOTE — Progress Notes (Signed)
Pre visit review using our clinic review tool, if applicable. No additional management support is needed unless otherwise documented below in the visit note. 

## 2014-03-10 NOTE — Assessment & Plan Note (Signed)
With occ brief spells of dizziness, one unexplained episode of syncope in the remote past. I am unable to confidently say that he is not having ataxia due to a neurologic problem such as intracranial mass, hydrocephalus, or hx of cerebellar or brainstem lacunar infarcts. Neuro exam reassuring.  Will further eval with MRI brain w w/o contrast.  Pt in agreement with plan.

## 2014-03-11 ENCOUNTER — Telehealth: Payer: Self-pay | Admitting: Family Medicine

## 2014-03-11 NOTE — Telephone Encounter (Signed)
Relevant patient education assigned to patient using Emmi. ° °

## 2014-03-12 ENCOUNTER — Ambulatory Visit (HOSPITAL_BASED_OUTPATIENT_CLINIC_OR_DEPARTMENT_OTHER)
Admission: RE | Admit: 2014-03-12 | Discharge: 2014-03-12 | Disposition: A | Discharge status: Home / Self Care | Payer: Medicare Other | Source: Ambulatory Visit | Attending: Family Medicine | Admitting: Family Medicine

## 2014-03-12 DIAGNOSIS — G9389 Other specified disorders of brain: Secondary | ICD-10-CM | POA: Diagnosis not present

## 2014-03-12 DIAGNOSIS — R279 Unspecified lack of coordination: Secondary | ICD-10-CM | POA: Diagnosis not present

## 2014-03-12 DIAGNOSIS — R42 Dizziness and giddiness: Secondary | ICD-10-CM | POA: Insufficient documentation

## 2014-03-12 DIAGNOSIS — R55 Syncope and collapse: Secondary | ICD-10-CM

## 2014-03-12 DIAGNOSIS — R270 Ataxia, unspecified: Secondary | ICD-10-CM

## 2014-03-12 MED ORDER — GADOBENATE DIMEGLUMINE 529 MG/ML IV SOLN: 20.0000 mL | Freq: Once | INTRAVENOUS | Status: AC | PRN

## 2014-03-18 ENCOUNTER — Other Ambulatory Visit: Payer: Self-pay | Admitting: Family Medicine

## 2014-03-21 ENCOUNTER — Encounter: Payer: Self-pay | Admitting: Family Medicine

## 2014-05-15 ENCOUNTER — Other Ambulatory Visit: Payer: Self-pay | Admitting: Family Medicine

## 2014-06-11 ENCOUNTER — Other Ambulatory Visit: Payer: Self-pay | Admitting: Family Medicine

## 2014-06-30 ENCOUNTER — Other Ambulatory Visit: Payer: Self-pay | Admitting: Family Medicine

## 2014-06-30 MED ORDER — VERAPAMIL HCL ER 240 MG PO TBCR: EXTENDED_RELEASE_TABLET | ORAL | Status: DC

## 2014-07-12 ENCOUNTER — Other Ambulatory Visit: Payer: Self-pay | Admitting: Family Medicine

## 2014-07-12 MED ORDER — OMEPRAZOLE 40 MG PO CPDR: DELAYED_RELEASE_CAPSULE | ORAL | Status: DC

## 2014-08-21 DIAGNOSIS — Z23 Encounter for immunization: Secondary | ICD-10-CM | POA: Diagnosis not present

## 2014-09-08 ENCOUNTER — Ambulatory Visit (INDEPENDENT_AMBULATORY_CARE_PROVIDER_SITE_OTHER): Payer: Medicare Other | Admitting: Family Medicine

## 2014-09-08 ENCOUNTER — Encounter: Payer: Self-pay | Admitting: Family Medicine

## 2014-09-08 VITALS — BP 151/78 | HR 70 | Temp 98.1°F | Resp 18 | Ht 70.0 in | Wt 213.0 lb

## 2014-09-08 DIAGNOSIS — K219 Gastro-esophageal reflux disease without esophagitis: Secondary | ICD-10-CM

## 2014-09-08 DIAGNOSIS — Z0000 Encounter for general adult medical examination without abnormal findings: Secondary | ICD-10-CM | POA: Diagnosis not present

## 2014-09-08 DIAGNOSIS — E785 Hyperlipidemia, unspecified: Secondary | ICD-10-CM

## 2014-09-08 DIAGNOSIS — I10 Essential (primary) hypertension: Secondary | ICD-10-CM | POA: Diagnosis not present

## 2014-09-08 DIAGNOSIS — Z23 Encounter for immunization: Secondary | ICD-10-CM

## 2014-09-08 DIAGNOSIS — D509 Iron deficiency anemia, unspecified: Secondary | ICD-10-CM

## 2014-09-08 LAB — CBC WITH DIFFERENTIAL/PLATELET
BASOS PCT: 0.7 % (ref 0.0–3.0)
Basophils Absolute: 0 10*3/uL (ref 0.0–0.1)
EOS ABS: 0.3 10*3/uL (ref 0.0–0.7)
Eosinophils Relative: 3.9 % (ref 0.0–5.0)
HEMATOCRIT: 40.6 % (ref 39.0–52.0)
Hemoglobin: 13.3 g/dL (ref 13.0–17.0)
Lymphocytes Relative: 18.6 % (ref 12.0–46.0)
Lymphs Abs: 1.3 10*3/uL (ref 0.7–4.0)
MCHC: 32.8 g/dL (ref 30.0–36.0)
MCV: 98.2 fl (ref 78.0–100.0)
MONO ABS: 0.7 10*3/uL (ref 0.1–1.0)
Monocytes Relative: 9.7 % (ref 3.0–12.0)
NEUTROS ABS: 4.7 10*3/uL (ref 1.4–7.7)
Neutrophils Relative %: 67.1 % (ref 43.0–77.0)
PLATELETS: 354 10*3/uL (ref 150.0–400.0)
RBC: 4.13 Mil/uL — ABNORMAL LOW (ref 4.22–5.81)
RDW: 14.5 % (ref 11.5–15.5)
WBC: 7 10*3/uL (ref 4.0–10.5)

## 2014-09-08 LAB — LIPID PANEL
CHOL/HDL RATIO: 3
Cholesterol: 198 mg/dL (ref 0–200)
HDL: 59.9 mg/dL (ref >39.00)
LDL Cholesterol: 106 mg/dL — ABNORMAL HIGH (ref 0–99)
NonHDL: 138.1
Triglycerides: 163 mg/dL — ABNORMAL HIGH (ref 0.0–149.0)
VLDL: 32.6 mg/dL (ref 0.0–40.0)

## 2014-09-08 LAB — FERRITIN: Ferritin: 44.5 ng/mL (ref 22.0–322.0)

## 2014-09-08 NOTE — Progress Notes (Signed)
OFFICE NOTE  09/08/2014  CC:  Chief Complaint  Patient presents with  . Follow-up    fasting   HPI: Patient is a 80 y.o. Caucasian male who is here for 6 mo f/u HTN, hyperlipidemia, GERD, hx of iron deficiency anemia.  He is fasting in prep for lipid panel today.  No acute complaints today.  Rarely checks bp at home: 355D-322G systolic, diastolic normal. Takes iron tab daily: says it turns his stool dark.  No hematochezia.   Taking prilosec daily and if he misses it he feels GER. Compliant with pravastatin today.  Pertinent PMH:  Past medical, surgical, social, and family history reviewed and no changes are noted since last office visit.  MEDS:  Outpatient Prescriptions Prior to Visit  Medication Sig Dispense Refill  . aspirin 81 MG tablet Take 81 mg by mouth daily.    . Azelastine HCl (ASTEPRO) 0.15 % SOLN 2 sprays in each nostril at bedtime every night 30 mL 11  . Cholecalciferol (VITAMIN D3) 2000 UNITS TABS Take by mouth daily.      Marland Kitchen losartan-hydrochlorothiazide (HYZAAR) 100-25 MG per tablet Take 1 tablet by mouth daily. 90 tablet 3  . multivitamin (THERAGRAN) per tablet Take 1 tablet by mouth daily.      Marland Kitchen omeprazole (PRILOSEC) 40 MG capsule Take 1 capsule (40 mg total) by mouth daily. 90 capsule 3  . omeprazole (PRILOSEC) 40 MG capsule TAKE 1 CAPSULE (40 MG TOTAL) BY MOUTH DAILY. 90 capsule 1  . pravastatin (PRAVACHOL) 80 MG tablet TAKE 1 TABLET (80 MG TOTAL) BY MOUTH DAILY. 90 tablet 1  . verapamil (CALAN-SR) 240 MG CR tablet TAKE 1 TABLET (240 MG TOTAL) BY MOUTH 2 (TWO) TIMES DAILY. 180 tablet 1   No facility-administered medications prior to visit.    PE: Blood pressure 151/78, pulse 70, temperature 98.1 F (36.7 C), temperature source Temporal, resp. rate 18, height 5\' 10"  (1.778 m), weight 213 lb (96.616 kg), SpO2 95 %. Gen: Alert, well appearing.  Patient is oriented to person, place, time, and situation. AFFECT: pleasant, lucid thought and speech. No further  exam today.  LAB: none today RECENT: Lab Results  Component Value Date   WBC 6.5 03/10/2014   HGB 13.2 03/10/2014   HCT 39.4 03/10/2014   MCV 98.4 03/10/2014   PLT 347.0 03/10/2014   Lab Results  Component Value Date   FERRITIN 36.2 03/10/2014   Lab Results  Component Value Date   IRON 88 03/11/2013   FERRITIN 36.2 03/10/2014   Lab Results  Component Value Date   CHOL 197 09/10/2013   HDL 61.10 09/10/2013   LDLCALC 111* 09/10/2013   LDLDIRECT 112.5 09/11/2011   TRIG 126.0 09/10/2013   CHOLHDL 3 09/10/2013     Chemistry      Component Value Date/Time   NA 138 03/10/2014 1109   K 3.4* 03/10/2014 1109   CL 102 03/10/2014 1109   CO2 31 03/10/2014 1109   BUN 21 03/10/2014 1109   CREATININE 1.1 03/10/2014 1109      Component Value Date/Time   CALCIUM 9.4 03/10/2014 1109   ALKPHOS 97 03/10/2012 1140   AST 18 03/10/2012 1140   ALT 14 03/10/2012 1140   BILITOT 0.1* 03/10/2012 1140      IMPRESSION AND PLAN:  1) HTN; The current medical regimen is effective;  continue present plan and medications. BMET today.  2) Hyperlipidemia: FLP and AST/ALT today. Tolerating statin well.  3) Hx of iron def anemia and hem +  stool (but most recent hemoccults in 2014 were neg).  Repeat CBC, ferritin, and hemoccults x 3  today.  4) GERD;The current medical regimen is effective;  continue present plan and medications.  5) Prev health care: prevnar 13 IM today.  An After Visit Summary was printed and given to the patient.  FOLLOW UP: 6 mo

## 2014-09-08 NOTE — Progress Notes (Signed)
Pre visit review using our clinic review tool, if applicable. No additional management support is needed unless otherwise documented below in the visit note. 

## 2014-09-12 ENCOUNTER — Telehealth: Payer: Self-pay

## 2014-09-12 NOTE — Telephone Encounter (Signed)
Noted  

## 2014-09-12 NOTE — Telephone Encounter (Signed)
The lab called to tell me they are unable to add a CMET to his blood. They state it is too old.

## 2014-09-20 NOTE — Addendum Note (Signed)
Addended by: Jannette Spanner on: 09/20/2014 01:50 PM   Modules accepted: Orders

## 2014-09-27 ENCOUNTER — Other Ambulatory Visit: Payer: Medicare Other

## 2014-09-30 DIAGNOSIS — H3560 Retinal hemorrhage, unspecified eye: Secondary | ICD-10-CM

## 2014-09-30 HISTORY — DX: Retinal hemorrhage, unspecified eye: H35.60

## 2014-10-05 ENCOUNTER — Other Ambulatory Visit: Payer: Medicare Other

## 2014-10-05 DIAGNOSIS — D509 Iron deficiency anemia, unspecified: Secondary | ICD-10-CM

## 2014-10-05 DIAGNOSIS — K219 Gastro-esophageal reflux disease without esophagitis: Secondary | ICD-10-CM

## 2014-10-05 LAB — HEMOCCULT SLIDES (X 3 CARDS)
Fecal Occult Blood: NEGATIVE
OCCULT 1: NEGATIVE
OCCULT 2: NEGATIVE
OCCULT 3: NEGATIVE
OCCULT 4: NEGATIVE
OCCULT 5: NEGATIVE

## 2014-10-05 NOTE — Addendum Note (Signed)
Addended by: Jannette Spanner on: 10/05/2014 01:07 PM   Modules accepted: Orders

## 2014-10-05 NOTE — Addendum Note (Signed)
Addended by: Jannette Spanner on: 10/05/2014 03:21 PM   Modules accepted: Orders

## 2014-10-24 DIAGNOSIS — H3561 Retinal hemorrhage, right eye: Secondary | ICD-10-CM | POA: Diagnosis not present

## 2014-11-02 DIAGNOSIS — H43813 Vitreous degeneration, bilateral: Secondary | ICD-10-CM | POA: Diagnosis not present

## 2014-11-02 DIAGNOSIS — H33311 Horseshoe tear of retina without detachment, right eye: Secondary | ICD-10-CM | POA: Diagnosis not present

## 2014-11-02 DIAGNOSIS — H3531 Nonexudative age-related macular degeneration: Secondary | ICD-10-CM | POA: Diagnosis not present

## 2014-11-02 DIAGNOSIS — H3532 Exudative age-related macular degeneration: Secondary | ICD-10-CM | POA: Diagnosis not present

## 2014-11-15 DIAGNOSIS — L570 Actinic keratosis: Secondary | ICD-10-CM | POA: Diagnosis not present

## 2014-11-15 DIAGNOSIS — L821 Other seborrheic keratosis: Secondary | ICD-10-CM | POA: Diagnosis not present

## 2014-11-15 DIAGNOSIS — L720 Epidermal cyst: Secondary | ICD-10-CM | POA: Diagnosis not present

## 2014-11-24 ENCOUNTER — Other Ambulatory Visit: Payer: Self-pay | Admitting: Family Medicine

## 2014-11-24 MED ORDER — PRAVASTATIN SODIUM 80 MG PO TABS: ORAL_TABLET | ORAL | Status: DC

## 2014-11-30 DIAGNOSIS — H3532 Exudative age-related macular degeneration: Secondary | ICD-10-CM | POA: Diagnosis not present

## 2014-11-30 DIAGNOSIS — H3531 Nonexudative age-related macular degeneration: Secondary | ICD-10-CM | POA: Diagnosis not present

## 2014-12-13 DIAGNOSIS — L820 Inflamed seborrheic keratosis: Secondary | ICD-10-CM | POA: Diagnosis not present

## 2014-12-13 DIAGNOSIS — L720 Epidermal cyst: Secondary | ICD-10-CM | POA: Diagnosis not present

## 2014-12-26 ENCOUNTER — Other Ambulatory Visit: Payer: Self-pay

## 2014-12-26 DIAGNOSIS — I10 Essential (primary) hypertension: Secondary | ICD-10-CM

## 2014-12-26 MED ORDER — VERAPAMIL HCL ER 240 MG PO TBCR: EXTENDED_RELEASE_TABLET | ORAL | Status: DC

## 2015-01-04 DIAGNOSIS — H33311 Horseshoe tear of retina without detachment, right eye: Secondary | ICD-10-CM | POA: Diagnosis not present

## 2015-01-04 DIAGNOSIS — H43813 Vitreous degeneration, bilateral: Secondary | ICD-10-CM | POA: Diagnosis not present

## 2015-01-04 DIAGNOSIS — H3531 Nonexudative age-related macular degeneration: Secondary | ICD-10-CM | POA: Diagnosis not present

## 2015-01-04 DIAGNOSIS — H3532 Exudative age-related macular degeneration: Secondary | ICD-10-CM | POA: Diagnosis not present

## 2015-01-09 ENCOUNTER — Other Ambulatory Visit: Payer: Self-pay | Admitting: *Deleted

## 2015-01-09 MED ORDER — OMEPRAZOLE 40 MG PO CPDR: DELAYED_RELEASE_CAPSULE | ORAL | Status: DC

## 2015-02-01 DIAGNOSIS — H43813 Vitreous degeneration, bilateral: Secondary | ICD-10-CM | POA: Diagnosis not present

## 2015-02-01 DIAGNOSIS — H3531 Nonexudative age-related macular degeneration: Secondary | ICD-10-CM | POA: Diagnosis not present

## 2015-02-01 DIAGNOSIS — H35412 Lattice degeneration of retina, left eye: Secondary | ICD-10-CM | POA: Diagnosis not present

## 2015-02-01 DIAGNOSIS — H3532 Exudative age-related macular degeneration: Secondary | ICD-10-CM | POA: Diagnosis not present

## 2015-03-01 DIAGNOSIS — H43813 Vitreous degeneration, bilateral: Secondary | ICD-10-CM | POA: Diagnosis not present

## 2015-03-01 DIAGNOSIS — H3532 Exudative age-related macular degeneration: Secondary | ICD-10-CM | POA: Diagnosis not present

## 2015-03-01 DIAGNOSIS — H3531 Nonexudative age-related macular degeneration: Secondary | ICD-10-CM | POA: Diagnosis not present

## 2015-03-01 DIAGNOSIS — H35412 Lattice degeneration of retina, left eye: Secondary | ICD-10-CM | POA: Diagnosis not present

## 2015-03-15 ENCOUNTER — Encounter: Payer: Self-pay | Admitting: Family Medicine

## 2015-03-15 ENCOUNTER — Ambulatory Visit (INDEPENDENT_AMBULATORY_CARE_PROVIDER_SITE_OTHER): Payer: Medicare Other | Admitting: Family Medicine

## 2015-03-15 VITALS — BP 132/81 | HR 77 | Temp 98.1°F | Resp 16 | Wt 214.0 lb

## 2015-03-15 DIAGNOSIS — I10 Essential (primary) hypertension: Secondary | ICD-10-CM | POA: Diagnosis not present

## 2015-03-15 DIAGNOSIS — E785 Hyperlipidemia, unspecified: Secondary | ICD-10-CM | POA: Diagnosis not present

## 2015-03-15 DIAGNOSIS — D509 Iron deficiency anemia, unspecified: Secondary | ICD-10-CM | POA: Diagnosis not present

## 2015-03-15 DIAGNOSIS — R54 Age-related physical debility: Secondary | ICD-10-CM

## 2015-03-15 LAB — COMPREHENSIVE METABOLIC PANEL
ALK PHOS: 79 U/L (ref 39–117)
ALT: 13 U/L (ref 0–53)
AST: 16 U/L (ref 0–37)
Albumin: 4.1 g/dL (ref 3.5–5.2)
BUN: 17 mg/dL (ref 6–23)
CALCIUM: 9.4 mg/dL (ref 8.4–10.5)
CHLORIDE: 101 meq/L (ref 96–112)
CO2: 31 meq/L (ref 19–32)
Creatinine, Ser: 0.96 mg/dL (ref 0.40–1.50)
GFR: 79.01 mL/min (ref >60.00)
GLUCOSE: 114 mg/dL — AB (ref 70–99)
POTASSIUM: 4.1 meq/L (ref 3.5–5.1)
SODIUM: 138 meq/L (ref 135–145)
TOTAL PROTEIN: 6.6 g/dL (ref 6.0–8.3)
Total Bilirubin: 0.4 mg/dL (ref 0.2–1.2)

## 2015-03-15 LAB — CBC
HCT: 40.8 % (ref 39.0–52.0)
HEMOGLOBIN: 13.4 g/dL (ref 13.0–17.0)
MCHC: 32.9 g/dL (ref 30.0–36.0)
MCV: 97.8 fl (ref 78.0–100.0)
Platelets: 357 10*3/uL (ref 150.0–400.0)
RBC: 4.18 Mil/uL — ABNORMAL LOW (ref 4.22–5.81)
RDW: 14.5 % (ref 11.5–15.5)
WBC: 7.4 10*3/uL (ref 4.0–10.5)

## 2015-03-15 LAB — FERRITIN: FERRITIN: 39.4 ng/mL (ref 22.0–322.0)

## 2015-03-15 MED ORDER — AMLODIPINE BESYLATE 5 MG PO TABS: 5.0000 mg | ORAL_TABLET | Freq: Every day | ORAL | Status: DC

## 2015-03-15 NOTE — Progress Notes (Signed)
Pre visit review using our clinic review tool, if applicable. No additional management support is needed unless otherwise documented below in the visit note. 

## 2015-03-15 NOTE — Progress Notes (Signed)
OFFICE NOTE  03/25/2015  CC:  Chief Complaint  Patient presents with  . Follow-up    Pt is not fasting.     HPI: Patient is a 81 y.o. Caucasian male who is here for 6 mo f/u HTN, hx of iron def anemia, hyperlipidemia, GERD. Recent R eye retinal hemorrhage/detachment, got injection treatment by ophtho in [place] and it has helped some, says minimal bother to him.  He was taken off of aspirin.  He is also on AREDS 2 vitamins.  Reviewed home bp's over the last month: avg syst 145-150, avg diast 70s. No HAs, no palp's, no dizziness. Compliant with all meds.  No hematochezia, melena, gross hematuria, nosebleeds, or excessive bruising.  No hemoptysis.   Pertinent PMH:  Past medical, surgical, social, and family history reviewed and changes are noted since last office visit.  MEDS:  Outpatient Prescriptions Prior to Visit  Medication Sig Dispense Refill  . Azelastine HCl (ASTEPRO) 0.15 % SOLN 2 sprays in each nostril at bedtime every night 30 mL 11  . Cholecalciferol (VITAMIN D3) 2000 UNITS TABS Take by mouth daily.      . ferrous sulfate 325 (65 FE) MG tablet Take 325 mg by mouth daily with breakfast.    . losartan-hydrochlorothiazide (HYZAAR) 100-25 MG per tablet Take 1 tablet by mouth daily. 90 tablet 3  . multivitamin (THERAGRAN) per tablet Take 1 tablet by mouth daily.      Marland Kitchen omeprazole (PRILOSEC) 40 MG capsule Take 1 capsule (40 mg total) by mouth daily. 90 capsule 3  . pravastatin (PRAVACHOL) 80 MG tablet TAKE 1 TABLET (80 MG TOTAL) BY MOUTH DAILY. 90 tablet 1  . aspirin 81 MG tablet Take 81 mg by mouth daily.    Marland Kitchen omeprazole (PRILOSEC) 40 MG capsule TAKE 1 CAPSULE (40 MG TOTAL) BY MOUTH DAILY. (Patient not taking: Reported on 03/15/2015) 90 capsule 1  . verapamil (CALAN-SR) 240 MG CR tablet TAKE 1 TABLET (240 MG TOTAL) BY MOUTH 2 (TWO) TIMES DAILY. 180 tablet 0   No facility-administered medications prior to visit.    PE: Blood pressure 132/81, pulse 77, temperature 98.1 F  (36.7 C), temperature source Oral, resp. rate 16, weight 214 lb (97.07 kg), SpO2 94 %. Gen: Alert, well appearing.  Patient is oriented to person, place, time, and situation. EXT: trace to 1+ pitting edema in both LLs, with pretibial splotchy hyperpigmentation and superficial flaking of skin of LL's.  LABS:  Lab Results  Component Value Date   TSH 1.85 03/10/2012   Lab Results  Component Value Date   WBC 7.4 03/15/2015   HGB 13.4 03/15/2015   HCT 40.8 03/15/2015   MCV 97.8 03/15/2015   PLT 357.0 03/15/2015   Lab Results  Component Value Date   CREATININE 0.96 03/15/2015   BUN 17 03/15/2015   NA 138 03/15/2015   K 4.1 03/15/2015   CL 101 03/15/2015   CO2 31 03/15/2015   Lab Results  Component Value Date   ALT 13 03/15/2015   AST 16 03/15/2015   ALKPHOS 79 03/15/2015   BILITOT 0.4 03/15/2015   Lab Results  Component Value Date   CHOL 198 09/08/2014   Lab Results  Component Value Date   HDL 59.90 09/08/2014   Lab Results  Component Value Date   LDLCALC 106* 09/08/2014   Lab Results  Component Value Date   TRIG 163.0* 09/08/2014   Lab Results  Component Value Date   CHOLHDL 3 09/08/2014   Lab Results  Component Value Date   IRON 88 03/11/2013   FERRITIN 39.4 03/15/2015   IMPRESSION AND PLAN:  1) HTN; not ideal control.  Add amlodipine 5mg  qd.  Discussed low Na diet today. Lytes/cr today.  2) Hx of iron deficiency anemia.  W/u 2013 revealed gastric erosions as likely source of occult blood loss. Has been on iron PO since that time. Recheck CBC no diff, IBC panel, ferritin.  Continue omeprazole 40 mg qd.  3) Hyperlipidemia; LDL 106, HDL 60, trigs 163 about 7 months ago. Check AST/ALT today.  Tolerating pravachol well.  An After Visit Summary was printed and given to the patient.  FOLLOW UP: 1 mo f/u HTN

## 2015-03-23 ENCOUNTER — Other Ambulatory Visit: Payer: Self-pay | Admitting: *Deleted

## 2015-03-23 DIAGNOSIS — I10 Essential (primary) hypertension: Secondary | ICD-10-CM

## 2015-03-23 MED ORDER — VERAPAMIL HCL ER 240 MG PO TBCR: EXTENDED_RELEASE_TABLET | ORAL | Status: DC

## 2015-03-23 NOTE — Telephone Encounter (Signed)
RF request for Verapamil.  LOV: 03/15/15 Next ov: 04/14/15 Last written: 12/26/14 #180 w/ 0RF

## 2015-03-29 DIAGNOSIS — H3531 Nonexudative age-related macular degeneration: Secondary | ICD-10-CM | POA: Diagnosis not present

## 2015-03-29 DIAGNOSIS — H3532 Exudative age-related macular degeneration: Secondary | ICD-10-CM | POA: Diagnosis not present

## 2015-03-29 DIAGNOSIS — H43813 Vitreous degeneration, bilateral: Secondary | ICD-10-CM | POA: Diagnosis not present

## 2015-03-29 DIAGNOSIS — H35412 Lattice degeneration of retina, left eye: Secondary | ICD-10-CM | POA: Diagnosis not present

## 2015-04-14 ENCOUNTER — Ambulatory Visit (INDEPENDENT_AMBULATORY_CARE_PROVIDER_SITE_OTHER): Payer: Medicare Other | Admitting: Family Medicine

## 2015-04-14 ENCOUNTER — Encounter: Payer: Self-pay | Admitting: Family Medicine

## 2015-04-14 VITALS — BP 128/76 | HR 67 | Temp 97.4°F | Resp 16 | Ht 70.0 in | Wt 216.0 lb

## 2015-04-14 DIAGNOSIS — I10 Essential (primary) hypertension: Secondary | ICD-10-CM | POA: Diagnosis not present

## 2015-04-14 NOTE — Progress Notes (Signed)
OFFICE NOTE  04/14/2015  CC:  Chief Complaint  Patient presents with  . Follow-up    4 week f/u   HPI: Patient is a 81 y.o. Caucasian male who is here for 4 wk f/u HTN.   Added amlodipine 5 mg qd last visit and pt denies any side effects. Home bp's the last one week reviewed today and avg syst 120s, avg diast 60s, avg HR 55-60.  Says his retinal detachment/vitreous hemorrhage is "controlled" as per recent eye MD f/u. He remains off ASA.  Asks about what he can do to get some of his abd fat off. He describes a high fat/high carb diet. He does absolutely no exercise.   Pertinent PMH:  Past medical, surgical, social, and family history reviewed and no changes are noted since last office visit.  MEDS:  Outpatient Prescriptions Prior to Visit  Medication Sig Dispense Refill  . Aflibercept (EYLEA IO) Inject into the eye. One injection into right eye every month    . amLODipine (NORVASC) 5 MG tablet Take 1 tablet (5 mg total) by mouth daily. 30 tablet 1  . Azelastine HCl (ASTEPRO) 0.15 % SOLN 2 sprays in each nostril at bedtime every night 30 mL 11  . Cholecalciferol (VITAMIN D3) 2000 UNITS TABS Take by mouth daily.      . ferrous sulfate 325 (65 FE) MG tablet Take 325 mg by mouth daily with breakfast.    . losartan-hydrochlorothiazide (HYZAAR) 100-25 MG per tablet Take 1 tablet by mouth daily. 90 tablet 3  . Multiple Vitamins-Minerals (PRESERVISION AREDS 2 PO) Take by mouth 2 (two) times daily.    . multivitamin (THERAGRAN) per tablet Take 1 tablet by mouth daily.      Marland Kitchen omeprazole (PRILOSEC) 40 MG capsule Take 1 capsule (40 mg total) by mouth daily. 90 capsule 3  . pravastatin (PRAVACHOL) 80 MG tablet TAKE 1 TABLET (80 MG TOTAL) BY MOUTH DAILY. 90 tablet 1  . verapamil (CALAN-SR) 240 MG CR tablet TAKE 1 TABLET (240 MG TOTAL) BY MOUTH 2 (TWO) TIMES DAILY. 180 tablet 1   No facility-administered medications prior to visit.    PE: Blood pressure 128/76, pulse 67, temperature 97.4  F (36.3 C), temperature source Oral, resp. rate 16, height 5\' 10"  (1.778 m), weight 216 lb (97.977 kg), SpO2 96 %. Gen: Alert, well appearing.  Patient is oriented to person, place, time, and situation. CV: RRR, 1/6 syst ejection murmur.  No rub or gallop. Chest is clear, no wheezing or rales. Normal symmetric air entry throughout both lung fields. No chest wall deformities or tenderness. ABD: soft, NT/ND, nontender. EXT: 2+ pitting edema w/out cyanosis or clubbing.  LABS:   Chemistry      Component Value Date/Time   NA 138 03/15/2015 1103   K 4.1 03/15/2015 1103   CL 101 03/15/2015 1103   CO2 31 03/15/2015 1103   BUN 17 03/15/2015 1103   CREATININE 0.96 03/15/2015 1103      Component Value Date/Time   CALCIUM 9.4 03/15/2015 1103   ALKPHOS 79 03/15/2015 1103   AST 16 03/15/2015 1103   ALT 13 03/15/2015 1103   BILITOT 0.4 03/15/2015 1103     Lab Results  Component Value Date   WBC 7.4 03/15/2015   HGB 13.4 03/15/2015   HCT 40.8 03/15/2015   MCV 97.8 03/15/2015   PLT 357.0 03/15/2015   Lab Results  Component Value Date   IRON 88 03/11/2013   FERRITIN 39.4 03/15/2015  IMPRESSION AND PLAN:  1) HTN; The current medical regimen is effective;  continue present plan and medications. He has to remain off ASA due to hx of vitreous hemorrhage.  2) Central adiposity/obestiy (BMI 31): discussed reasonable dietary and exercise changes/habits.  An After Visit Summary was printed and given to the patient.  FOLLOW UP: 6 mo

## 2015-04-14 NOTE — Progress Notes (Signed)
Pre visit review using our clinic review tool, if applicable. No additional management support is needed unless otherwise documented below in the visit note. 

## 2015-05-01 DIAGNOSIS — H3532 Exudative age-related macular degeneration: Secondary | ICD-10-CM | POA: Diagnosis not present

## 2015-05-10 DIAGNOSIS — H3531 Nonexudative age-related macular degeneration: Secondary | ICD-10-CM | POA: Diagnosis not present

## 2015-05-10 DIAGNOSIS — H3532 Exudative age-related macular degeneration: Secondary | ICD-10-CM | POA: Diagnosis not present

## 2015-05-10 DIAGNOSIS — H35412 Lattice degeneration of retina, left eye: Secondary | ICD-10-CM | POA: Diagnosis not present

## 2015-05-10 DIAGNOSIS — H43813 Vitreous degeneration, bilateral: Secondary | ICD-10-CM | POA: Diagnosis not present

## 2015-05-29 ENCOUNTER — Other Ambulatory Visit: Payer: Self-pay | Admitting: *Deleted

## 2015-05-29 MED ORDER — AMLODIPINE BESYLATE 5 MG PO TABS
5.0000 mg | ORAL_TABLET | Freq: Every day | ORAL | Status: DC
Start: 2015-05-29 — End: 2015-10-11

## 2015-05-29 NOTE — Telephone Encounter (Signed)
RF request for amlodipine LOV: 04/14/15 Next ov: None Last written: 03/15/15 #30 w/ 1RF

## 2015-05-30 ENCOUNTER — Other Ambulatory Visit: Payer: Self-pay | Admitting: *Deleted

## 2015-05-30 MED ORDER — PRAVASTATIN SODIUM 80 MG PO TABS: ORAL_TABLET | ORAL | Status: DC

## 2015-05-30 NOTE — Telephone Encounter (Signed)
RF request for pravastatin LOV: 04/14/15 Next ov: 10/18/15 Last written: 11/24/14 #90 w/ 1RF

## 2015-06-12 ENCOUNTER — Other Ambulatory Visit: Payer: Self-pay | Admitting: *Deleted

## 2015-06-12 MED ORDER — LOSARTAN POTASSIUM-HCTZ 100-25 MG PO TABS: 1.0000 | ORAL_TABLET | Freq: Every day | ORAL | Status: DC

## 2015-06-12 NOTE — Telephone Encounter (Signed)
RF request for losartan/hctz LOV: 04/14/15 Next ov: 10/18/15 Last written: 03/10/14 #90 w/ 3RF

## 2015-07-03 ENCOUNTER — Other Ambulatory Visit: Payer: Self-pay | Admitting: *Deleted

## 2015-07-03 MED ORDER — OMEPRAZOLE 40 MG PO CPDR: 40.0000 mg | DELAYED_RELEASE_CAPSULE | Freq: Every day | ORAL | Status: DC

## 2015-07-03 NOTE — Telephone Encounter (Signed)
RF request for omeprazole LOV: 04/14/15  Next ov:  10/18/15 Last written: 03/10/14 #90 w/ 3RF

## 2015-07-05 DIAGNOSIS — H353122 Nonexudative age-related macular degeneration, left eye, intermediate dry stage: Secondary | ICD-10-CM | POA: Diagnosis not present

## 2015-07-05 DIAGNOSIS — H353211 Exudative age-related macular degeneration, right eye, with active choroidal neovascularization: Secondary | ICD-10-CM | POA: Diagnosis not present

## 2015-07-05 DIAGNOSIS — H35412 Lattice degeneration of retina, left eye: Secondary | ICD-10-CM | POA: Diagnosis not present

## 2015-07-05 DIAGNOSIS — H43813 Vitreous degeneration, bilateral: Secondary | ICD-10-CM | POA: Diagnosis not present

## 2015-08-18 ENCOUNTER — Ambulatory Visit (INDEPENDENT_AMBULATORY_CARE_PROVIDER_SITE_OTHER): Payer: Medicare Other | Admitting: Family Medicine

## 2015-08-18 ENCOUNTER — Encounter: Payer: Self-pay | Admitting: Family Medicine

## 2015-08-18 VITALS — BP 124/73 | HR 71 | Temp 97.9°F | Resp 16 | Ht 70.0 in | Wt 214.0 lb

## 2015-08-18 DIAGNOSIS — I872 Venous insufficiency (chronic) (peripheral): Secondary | ICD-10-CM | POA: Diagnosis not present

## 2015-08-18 DIAGNOSIS — I8311 Varicose veins of right lower extremity with inflammation: Secondary | ICD-10-CM | POA: Diagnosis not present

## 2015-08-18 DIAGNOSIS — R21 Rash and other nonspecific skin eruption: Secondary | ICD-10-CM

## 2015-08-18 MED ORDER — FLUTICASONE PROPIONATE 0.05 % EX CREA: TOPICAL_CREAM | CUTANEOUS | Status: DC

## 2015-08-18 NOTE — Progress Notes (Signed)
OFFICE NOTE  08/18/2015  CC:  Chief Complaint  Patient presents with  . Rash     HPI: Patient is a 81 y.o. Caucasian male who is here for rash. Onset of a little red splotch on L upper arm, minimally itchy, followed gradually by similar splotches on front and back of L thigh and a few on R anterior thigh.  No pain.  He doesn't recall any significant recent infection/illness preceding onset of the rash. No fevers.  No blisters or pustules.  R >L lower leg with flaky stasis dermatitis, feels like R side is worse last few weeks, itchy somewhat but not painful.    Pertinent PMH:  Past medical, surgical, social, and family history reviewed and no changes are noted since last office visit.  MEDS:  Outpatient Prescriptions Prior to Visit  Medication Sig Dispense Refill  . Aflibercept (EYLEA IO) Inject into the eye. One injection into right eye every month    . amLODipine (NORVASC) 5 MG tablet Take 1 tablet (5 mg total) by mouth daily. 30 tablet 3  . Azelastine HCl (ASTEPRO) 0.15 % SOLN 2 sprays in each nostril at bedtime every night 30 mL 11  . Cholecalciferol (VITAMIN D3) 2000 UNITS TABS Take by mouth daily.      . ferrous sulfate 325 (65 FE) MG tablet Take 325 mg by mouth daily with breakfast.    . losartan-hydrochlorothiazide (HYZAAR) 100-25 MG per tablet Take 1 tablet by mouth daily. 90 tablet 1  . Multiple Vitamins-Minerals (PRESERVISION AREDS 2 PO) Take by mouth 2 (two) times daily.    . multivitamin (THERAGRAN) per tablet Take 1 tablet by mouth daily.      Marland Kitchen omeprazole (PRILOSEC) 40 MG capsule Take 1 capsule (40 mg total) by mouth daily. 90 capsule 3  . pravastatin (PRAVACHOL) 80 MG tablet TAKE 1 TABLET (80 MG TOTAL) BY MOUTH DAILY. 90 tablet 1  . verapamil (CALAN-SR) 240 MG CR tablet TAKE 1 TABLET (240 MG TOTAL) BY MOUTH 2 (TWO) TIMES DAILY. 180 tablet 1   No facility-administered medications prior to visit.    PE: Blood pressure 124/73, pulse 71, temperature 97.9 F (36.6  C), temperature source Oral, resp. rate 16, height 5\' 10"  (1.778 m), weight 214 lb (97.07 kg), SpO2 94 %. Gen: Alert, well appearing.  Patient is oriented to person, place, time, and situation. SKIN: R upper arm medial aspect with dime sized blanching pinkish macule w/out distinct borders. Left thigh and right thigh each with approx 4-6 distinct quarter-sized pinkish macules with superficial peeling, esp a distinct collarette border of peeling skin.  Nontender.  No pustules or vesicles or streaking.  No nodularity.  R>L lower leg with diffuse superficial desquamation/peeling, hyperpigmentation, and mild erythema on right. Mild warmth on R.  No tenderness to palpation.  He has 2-3+ pitting edema in both LL's from knees down.  IMPRESSION AND PLAN:  1) Rash suspicious for atypical pityriasis rosea.  Discussed possible postviral nature of this, no treatment needed, just obs. May apply cutivate cream I rx'd for his LE stasis derm today if any of the areas are particularly itchy, although he says none are at this time.    2) Bilat, R>L stasis dermatitis.  More active inflammation on R, so I rx'd cutivate 0.05% cream to apply bid to the area.  No sign of cellulitis at this time.  We spent time today discussing low Na diet--he adds lots of salt to food---decided to try to start with NO ADDING SALT  TO FOOD.  Also discussed daily elevation of legs for 20 min. If not much improved edema by next o/v in a couple of months, or if stasis derm rash persists as a problem, may have to use some prn lasix OR get him off norvasc to see if LE swelling improves.  3) Bilat LE venous insufficiency edema: see #2 above.  An After Visit Summary was printed and given to the patient.  FOLLOW UP: keep appt already set for 10/17/15

## 2015-09-22 ENCOUNTER — Other Ambulatory Visit: Payer: Self-pay | Admitting: *Deleted

## 2015-09-22 DIAGNOSIS — I10 Essential (primary) hypertension: Secondary | ICD-10-CM

## 2015-09-22 MED ORDER — VERAPAMIL HCL ER 240 MG PO TBCR: EXTENDED_RELEASE_TABLET | ORAL | Status: DC

## 2015-09-26 DIAGNOSIS — H353122 Nonexudative age-related macular degeneration, left eye, intermediate dry stage: Secondary | ICD-10-CM | POA: Diagnosis not present

## 2015-09-26 DIAGNOSIS — H43813 Vitreous degeneration, bilateral: Secondary | ICD-10-CM | POA: Diagnosis not present

## 2015-09-26 DIAGNOSIS — H35412 Lattice degeneration of retina, left eye: Secondary | ICD-10-CM | POA: Diagnosis not present

## 2015-09-26 DIAGNOSIS — H353211 Exudative age-related macular degeneration, right eye, with active choroidal neovascularization: Secondary | ICD-10-CM | POA: Diagnosis not present

## 2015-09-27 ENCOUNTER — Encounter: Payer: Self-pay | Admitting: *Deleted

## 2015-10-11 ENCOUNTER — Other Ambulatory Visit: Payer: Self-pay | Admitting: *Deleted

## 2015-10-11 MED ORDER — AMLODIPINE BESYLATE 5 MG PO TABS: 5.0000 mg | ORAL_TABLET | Freq: Every day | ORAL | Status: DC

## 2015-10-11 NOTE — Telephone Encounter (Signed)
RF request for amlodipine LOV: 03/15/15 Next ov: 10/18/15 Last written: 05/29/15 #30 w/ 3RF

## 2015-10-18 ENCOUNTER — Encounter: Payer: Self-pay | Admitting: Family Medicine

## 2015-10-18 ENCOUNTER — Ambulatory Visit (INDEPENDENT_AMBULATORY_CARE_PROVIDER_SITE_OTHER): Payer: Medicare Other | Admitting: Family Medicine

## 2015-10-18 VITALS — BP 134/77 | HR 59 | Temp 97.7°F | Resp 16 | Ht 70.0 in | Wt 218.8 lb

## 2015-10-18 DIAGNOSIS — E669 Obesity, unspecified: Secondary | ICD-10-CM

## 2015-10-18 DIAGNOSIS — I10 Essential (primary) hypertension: Secondary | ICD-10-CM | POA: Diagnosis not present

## 2015-10-18 DIAGNOSIS — I8311 Varicose veins of right lower extremity with inflammation: Secondary | ICD-10-CM | POA: Diagnosis not present

## 2015-10-18 DIAGNOSIS — I8312 Varicose veins of left lower extremity with inflammation: Secondary | ICD-10-CM

## 2015-10-18 DIAGNOSIS — I872 Venous insufficiency (chronic) (peripheral): Secondary | ICD-10-CM | POA: Diagnosis not present

## 2015-10-18 DIAGNOSIS — E785 Hyperlipidemia, unspecified: Secondary | ICD-10-CM | POA: Diagnosis not present

## 2015-10-18 LAB — BASIC METABOLIC PANEL
BUN: 19 mg/dL (ref 6–23)
CALCIUM: 9.5 mg/dL (ref 8.4–10.5)
CHLORIDE: 100 meq/L (ref 96–112)
CO2: 32 mEq/L (ref 19–32)
Creatinine, Ser: 0.95 mg/dL (ref 0.40–1.50)
GFR: 79.85 mL/min (ref >60.00)
Glucose, Bld: 96 mg/dL (ref 70–99)
POTASSIUM: 4.5 meq/L (ref 3.5–5.1)
SODIUM: 139 meq/L (ref 135–145)

## 2015-10-18 LAB — LIPID PANEL
CHOL/HDL RATIO: 3
Cholesterol: 204 mg/dL — ABNORMAL HIGH (ref 0–200)
HDL: 58.7 mg/dL (ref >39.00)
LDL Cholesterol: 113 mg/dL — ABNORMAL HIGH (ref 0–99)
NONHDL: 145.23
TRIGLYCERIDES: 162 mg/dL — AB (ref 0.0–149.0)
VLDL: 32.4 mg/dL (ref 0.0–40.0)

## 2015-10-18 NOTE — Progress Notes (Signed)
Pre visit review using our clinic review tool, if applicable. No additional management support is needed unless otherwise documented below in the visit note. 

## 2015-10-18 NOTE — Progress Notes (Signed)
OFFICE VISIT  10/18/2015   CC:  Chief Complaint  Patient presents with  . Follow-up    Pt is fasting.    HPI:    Patient is a 82 y.o. Caucasian male who presents for 6 mo f/u HTN, hyperlipidemia, obesity, and bilat LE venous insufficiency.  He has not eaten today. No home bp monitoring.  Compliant with bp meds.  Legs swelling fluctuates.  He doesn't limit sodium.  No pain in LL's.  Occ even has weeping of fluid through skin of LL's.  He has not changed anything with his diet or exercise habits (no exercise) since I last saw him. Mood is "OK, I don't worry about a whole hell of a lot".  Naps during daytime and subsequently doesn't sleep that well some nights.  He remains off ASA due to hx of vitreous hemorrhage.  Has f/u with his eye MD once q8 wks.  No acute complaints today. ROS: no CP, no SOB, no focal weakness, no hematochezia or melena, no dizziness or HA's.  Past Medical History  Diagnosis Date  . Hypertension   . Hyperlipidemia   . GERD (gastroesophageal reflux disease)   . DDD (degenerative disc disease)     L-spine  . Arthritis   . Cataract   . Anemia 02/2012    Mild iron deficiency  . Heme positive stool 02/2012    colonoscopy normal.  EGD showed small hiatal hernia with associated Cameron's erosions.  . Ataxia 2014/15    brain MRI 02/2014 showed encephalomalacia parietal and occip lobes c/w old infarcts, plus diffuse cerebral atrophy, o/w normal  . Retinal hemorrhage 2016    w/ retinal detachment (right eye) : anticoagulants stopped due to this  . Obesity (BMI 30-39.9)   . Venous insufficiency of both lower extremities     Past Surgical History  Procedure Laterality Date  . Appendectomy  1935  . Tonsillectomy  1935  . Colonoscopy  05/12/12    for iron def anemia--NORMAL RESULT  . Nasal sinus surgery      1998  . Cataract surg      Bilateral/ 08-13  . Esophagogastroduodenoscopy  06/17/12    Small hiatal hernia with associated Cameron's erosions--likely  source of his mild iron def anemia.    Outpatient Prescriptions Prior to Visit  Medication Sig Dispense Refill  . Aflibercept (EYLEA IO) Inject into the eye. One injection into right eye every month    . amLODipine (NORVASC) 5 MG tablet Take 1 tablet (5 mg total) by mouth daily. 30 tablet 11  . Azelastine HCl (ASTEPRO) 0.15 % SOLN 2 sprays in each nostril at bedtime every night 30 mL 11  . Cholecalciferol (VITAMIN D3) 2000 UNITS TABS Take by mouth daily.      . ferrous sulfate 325 (65 FE) MG tablet Take 325 mg by mouth daily with breakfast.    . fluticasone (CUTIVATE) 0.05 % cream Apply to affected areas bid prn 30 g 1  . losartan-hydrochlorothiazide (HYZAAR) 100-25 MG per tablet Take 1 tablet by mouth daily. 90 tablet 1  . Multiple Vitamins-Minerals (PRESERVISION AREDS 2 PO) Take by mouth 2 (two) times daily.    . multivitamin (THERAGRAN) per tablet Take 1 tablet by mouth daily.      Marland Kitchen omeprazole (PRILOSEC) 40 MG capsule Take 1 capsule (40 mg total) by mouth daily. 90 capsule 3  . pravastatin (PRAVACHOL) 80 MG tablet TAKE 1 TABLET (80 MG TOTAL) BY MOUTH DAILY. 90 tablet 1  . verapamil (CALAN-SR) 240  MG CR tablet TAKE 1 TABLET (240 MG TOTAL) BY MOUTH 2 (TWO) TIMES DAILY. 180 tablet 0   No facility-administered medications prior to visit.    No Known Allergies  ROS As per HPI  PE: Blood pressure 134/77, pulse 59, temperature 97.7 F (36.5 C), temperature source Oral, resp. rate 16, height 5\' 10"  (1.778 m), weight 218 lb 12 oz (99.224 kg), SpO2 94 %. Gen: Alert, well appearing.  Patient is oriented to person, place, time, and situation. CV: RRR, soft systolic flow murmur at cardiac base, no rub or gallop. Chest is clear, no wheezing or rales. Normal symmetric air entry throughout both lung fields. No chest wall deformities or tenderness. EXT: 2+ pitting edema bilat in LL's from 1/2 way down extending into feet.  Fibrous/thickening skin changes with hemosiderin pigmentation changes  present in both ankles, mild warmth in these areas bilat, no tenderness.  R leg a bit more edematous than L.  LABS:  Lab Results  Component Value Date   WBC 7.4 03/15/2015   HGB 13.4 03/15/2015   HCT 40.8 03/15/2015   MCV 97.8 03/15/2015   PLT 357.0 03/15/2015     Chemistry      Component Value Date/Time   NA 138 03/15/2015 1103   K 4.1 03/15/2015 1103   CL 101 03/15/2015 1103   CO2 31 03/15/2015 1103   BUN 17 03/15/2015 1103   CREATININE 0.96 03/15/2015 1103      Component Value Date/Time   CALCIUM 9.4 03/15/2015 1103   ALKPHOS 79 03/15/2015 1103   AST 16 03/15/2015 1103   ALT 13 03/15/2015 1103   BILITOT 0.4 03/15/2015 1103     Lab Results  Component Value Date   CHOL 198 09/08/2014   HDL 59.90 09/08/2014   LDLCALC 106* 09/08/2014   LDLDIRECT 112.5 09/11/2011   TRIG 163.0* 09/08/2014   CHOLHDL 3 09/08/2014    IMPRESSION AND PLAN:  1) HTN; The current medical regimen is effective;  continue present plan and medications. Check lytes/cr today.  2) Hyperlipidemia: FLP today.  Tolerating statin.  AST/ALT good 03/2015.  3) Chronic LE venous insufficiency edema: Reiterated importance of low Na diet, elevation of legs above level of heart 20 min at least once daily. For his chronic stasis dermatitis in this area he has cutivate 0.05% cream to use bid prn.  4) Obesity: he essentially is set in his ways and refuses to exercise and/or change diet.  An After Visit Summary was printed and given to the patient.  FOLLOW UP: Return in about 6 months (around 04/16/2016) for annual CPE (fasting) (30 min).

## 2015-11-14 ENCOUNTER — Other Ambulatory Visit: Payer: Self-pay | Admitting: *Deleted

## 2015-11-14 MED ORDER — AZELASTINE HCL 0.15 % NA SOLN: NASAL | Status: DC

## 2015-11-14 NOTE — Telephone Encounter (Signed)
RF request for azelastine nasal spray LOV: 10/18/15 Next ov: 04/16/16 Last written: 03/10/14 #50mL w/ 11RF

## 2015-11-24 ENCOUNTER — Other Ambulatory Visit: Payer: Self-pay | Admitting: *Deleted

## 2015-11-24 MED ORDER — PRAVASTATIN SODIUM 80 MG PO TABS: ORAL_TABLET | ORAL | Status: DC

## 2015-11-24 NOTE — Telephone Encounter (Signed)
RF request for pravastatin LOV: 10/18/15 Next ov: 04/16/16 Last written: 05/30/15 #90 w/ 1RF

## 2015-12-05 ENCOUNTER — Telehealth: Payer: Self-pay

## 2015-12-05 MED ORDER — LOSARTAN POTASSIUM-HCTZ 100-25 MG PO TABS: 1.0000 | ORAL_TABLET | Freq: Every day | ORAL | Status: DC

## 2015-12-05 NOTE — Telephone Encounter (Signed)
rf rq sent by [facility] for losartan-HCTZ 100-25 mg.   LOV was 10/2015. Rx rf sent for #90 with 1 rf

## 2015-12-06 DIAGNOSIS — H353211 Exudative age-related macular degeneration, right eye, with active choroidal neovascularization: Secondary | ICD-10-CM | POA: Diagnosis not present

## 2015-12-06 DIAGNOSIS — H353122 Nonexudative age-related macular degeneration, left eye, intermediate dry stage: Secondary | ICD-10-CM | POA: Diagnosis not present

## 2015-12-06 DIAGNOSIS — H43813 Vitreous degeneration, bilateral: Secondary | ICD-10-CM | POA: Diagnosis not present

## 2015-12-12 DIAGNOSIS — D0439 Carcinoma in situ of skin of other parts of face: Secondary | ICD-10-CM | POA: Diagnosis not present

## 2015-12-12 DIAGNOSIS — L249 Irritant contact dermatitis, unspecified cause: Secondary | ICD-10-CM | POA: Diagnosis not present

## 2015-12-21 ENCOUNTER — Encounter: Payer: Self-pay | Admitting: Family Medicine

## 2016-01-30 DIAGNOSIS — L249 Irritant contact dermatitis, unspecified cause: Secondary | ICD-10-CM | POA: Diagnosis not present

## 2016-02-06 ENCOUNTER — Other Ambulatory Visit: Payer: Self-pay

## 2016-02-06 DIAGNOSIS — I10 Essential (primary) hypertension: Secondary | ICD-10-CM

## 2016-02-06 MED ORDER — VERAPAMIL HCL ER 240 MG PO TBCR: EXTENDED_RELEASE_TABLET | ORAL | Status: DC

## 2016-02-13 DIAGNOSIS — H353211 Exudative age-related macular degeneration, right eye, with active choroidal neovascularization: Secondary | ICD-10-CM | POA: Diagnosis not present

## 2016-02-13 DIAGNOSIS — H43813 Vitreous degeneration, bilateral: Secondary | ICD-10-CM | POA: Diagnosis not present

## 2016-02-13 DIAGNOSIS — H35412 Lattice degeneration of retina, left eye: Secondary | ICD-10-CM | POA: Diagnosis not present

## 2016-02-13 DIAGNOSIS — H353122 Nonexudative age-related macular degeneration, left eye, intermediate dry stage: Secondary | ICD-10-CM | POA: Diagnosis not present

## 2016-03-30 DIAGNOSIS — L03119 Cellulitis of unspecified part of limb: Secondary | ICD-10-CM | POA: Diagnosis not present

## 2016-04-16 ENCOUNTER — Encounter: Payer: Self-pay | Admitting: Family Medicine

## 2016-04-16 ENCOUNTER — Ambulatory Visit: Payer: Medicare Other | Admitting: Family Medicine

## 2016-04-16 ENCOUNTER — Ambulatory Visit (INDEPENDENT_AMBULATORY_CARE_PROVIDER_SITE_OTHER): Payer: Medicare Other | Admitting: Family Medicine

## 2016-04-16 VITALS — BP 124/64 | HR 51 | Temp 97.6°F | Resp 16 | Ht 69.0 in | Wt 208.5 lb

## 2016-04-16 DIAGNOSIS — Z862 Personal history of diseases of the blood and blood-forming organs and certain disorders involving the immune mechanism: Secondary | ICD-10-CM | POA: Diagnosis not present

## 2016-04-16 DIAGNOSIS — L309 Dermatitis, unspecified: Secondary | ICD-10-CM | POA: Diagnosis not present

## 2016-04-16 DIAGNOSIS — Z0000 Encounter for general adult medical examination without abnormal findings: Secondary | ICD-10-CM

## 2016-04-16 LAB — COMPREHENSIVE METABOLIC PANEL
ALBUMIN: 4.3 g/dL (ref 3.5–5.2)
ALK PHOS: 94 U/L (ref 39–117)
ALT: 16 U/L (ref 0–53)
AST: 16 U/L (ref 0–37)
BUN: 19 mg/dL (ref 6–23)
CALCIUM: 9.5 mg/dL (ref 8.4–10.5)
CO2: 29 mEq/L (ref 19–32)
CREATININE: 1.03 mg/dL (ref 0.40–1.50)
Chloride: 100 mEq/L (ref 96–112)
GFR: 72.66 mL/min (ref >60.00)
Glucose, Bld: 101 mg/dL — ABNORMAL HIGH (ref 70–99)
POTASSIUM: 3.8 meq/L (ref 3.5–5.1)
SODIUM: 138 meq/L (ref 135–145)
TOTAL PROTEIN: 7.1 g/dL (ref 6.0–8.3)
Total Bilirubin: 0.6 mg/dL (ref 0.2–1.2)

## 2016-04-16 LAB — SEDIMENTATION RATE: SED RATE: 70 mm/h — AB (ref 0–20)

## 2016-04-16 MED ORDER — MUPIROCIN 2 % EX OINT: 1.0000 "application " | TOPICAL_OINTMENT | Freq: Three times a day (TID) | CUTANEOUS | Status: DC

## 2016-04-16 NOTE — Progress Notes (Signed)
Office Note 04/16/2016  CC:  Chief Complaint  Patient presents with  . Annual Exam    Pt is fasting.     HPI:  Glen Barnett is a 82 y.o. White male who is here for annual health maintenance exam.  He starts in on his skin problems right away. Crusty pinkish rash on pretibial surfaces and medial ankles has been present since beginning of 2017. No pain, minimal itching.  I gave him cutivate cream for this last time I saw him. He goes to a Paediatric nurse in [place].  He has gotten a course of amoxil and a course of keflex, as well as increasing potency topical steroids.  Most recently he got an injection of something from Dr. Altamese Cabal.  He also seems to have developed some flaky pink oval lesions on his back that he says are "more recent" and asymptomatic.    He continues to get attention for eye problems (hx of retinal/vitreous hemorrhage): his aspirin is being held due to this.  Dental preventative exams are UTD per pt report.  At the end of today's visit, he brings up a question: "I've been losing wt but my belly is getting fatter, why is that?"  I said we would have to start addressing this question at his next f/u in 2 wks.  Past Medical History  Diagnosis Date  . Hypertension   . Hyperlipidemia   . GERD (gastroesophageal reflux disease)   . DDD (degenerative disc disease)     L-spine  . Arthritis   . Cataract   . Anemia 02/2012    Mild iron deficiency  . Heme positive stool 02/2012    colonoscopy normal.  EGD showed small hiatal hernia with associated Cameron's erosions.  Hemoccults NEG x 3 09/2014.  . Ataxia 2014/15    brain MRI 02/2014 showed encephalomalacia parietal and occip lobes c/w old infarcts, plus diffuse cerebral atrophy, o/w normal  . Retinal hemorrhage 2016    w/ retinal detachment (right eye) : anticoagulants stopped due to this  . Obesity (BMI 30-39.9)   . Venous insufficiency of both lower extremities   . Macular degeneration, age related      exudative R eye; nonexudative L eye.  Vitreous degeneration bilat.    Past Surgical History  Procedure Laterality Date  . Appendectomy  1935  . Tonsillectomy  1935  . Colonoscopy  05/12/12    for iron def anemia--NORMAL RESULT  . Nasal sinus surgery      1998  . Cataract surg      Bilateral/ 08-13  . Esophagogastroduodenoscopy  06/17/12    Small hiatal hernia with associated Cameron's erosions--likely source of his mild iron def anemia.    Family History  Problem Relation Age of Onset  . Liver cancer Mother     Social History   Social History  . Marital Status: Widowed    Spouse Name: N/A  . Number of Children: N/A  . Years of Education: N/A   Occupational History  . Not on file.   Social History Main Topics  . Smoking status: Never Smoker   . Smokeless tobacco: Never Used  . Alcohol Use: No  . Drug Use: No  . Sexual Activity: Not on file   Other Topics Concern  . Not on file   Social History Narrative   Widower.  Wife died 2011/08/21 of brain cancer.   He relocated from [place] to live near son.   NO exercise.  No T/A/Ds.  Retired Chief Financial Officer.    Outpatient Prescriptions Prior to Visit  Medication Sig Dispense Refill  . Aflibercept (EYLEA IO) Inject into the eye. One injection into right eye every month    . amLODipine (NORVASC) 5 MG tablet Take 1 tablet (5 mg total) by mouth daily. 30 tablet 11  . Azelastine HCl (ASTEPRO) 0.15 % SOLN 2 sprays in each nostril at bedtime every night 30 mL 11  . Cholecalciferol (VITAMIN D3) 2000 UNITS TABS Take by mouth daily.      . ferrous sulfate 325 (65 FE) MG tablet Take 325 mg by mouth daily with breakfast.    . fluticasone (CUTIVATE) 0.05 % cream Apply to affected areas bid prn 30 g 1  . losartan-hydrochlorothiazide (HYZAAR) 100-25 MG tablet Take 1 tablet by mouth daily. 90 tablet 1  . Multiple Vitamins-Minerals (PRESERVISION AREDS 2 PO) Take by mouth 2 (two) times daily.    . multivitamin (THERAGRAN) per tablet  Take 1 tablet by mouth daily.      Marland Kitchen omeprazole (PRILOSEC) 40 MG capsule Take 1 capsule (40 mg total) by mouth daily. 90 capsule 3  . pravastatin (PRAVACHOL) 80 MG tablet TAKE 1 TABLET (80 MG TOTAL) BY MOUTH DAILY. 90 tablet 3  . tobramycin (TOBREX) 0.3 % ophthalmic solution Place 1 drop into the right eye. Four times daily begin 1 day prior to treatment  5  . verapamil (CALAN-SR) 240 MG CR tablet TAKE 1 TABLET (240 MG TOTAL) BY MOUTH 2 (TWO) TIMES DAILY. 180 tablet 0   No facility-administered medications prior to visit.    No Known Allergies  ROS Review of Systems  Constitutional: Positive for unexpected weight change. Negative for fever, chills, appetite change and fatigue.  HENT: Negative for congestion, dental problem, ear pain and sore throat.   Eyes: Negative for discharge, redness and visual disturbance.  Respiratory: Negative for cough, chest tightness, shortness of breath and wheezing.   Cardiovascular: Negative for chest pain, palpitations and leg swelling.  Gastrointestinal: Positive for abdominal distention. Negative for nausea, vomiting, abdominal pain, diarrhea and blood in stool.  Genitourinary: Negative for dysuria, urgency, frequency, hematuria, flank pain and difficulty urinating.  Musculoskeletal: Negative for myalgias, back pain, joint swelling, arthralgias and neck stiffness.  Skin: Positive for rash. Negative for pallor.  Neurological: Negative for dizziness, speech difficulty, weakness and headaches.  Hematological: Negative for adenopathy. Does not bruise/bleed easily.  Psychiatric/Behavioral: Negative for confusion and sleep disturbance. The patient is not nervous/anxious.     PE; Blood pressure 124/64, pulse 51, temperature 97.6 F (36.4 C), temperature source Oral, resp. rate 16, height 5\' 9"  (1.753 m), weight 208 lb 8 oz (94.575 kg), SpO2 93 %. Gen: Alert, well appearing.  Patient is oriented to person, place, time, and situation. AFFECT: pleasant, lucid  thought and speech. ENT: Ears: EACs clear, normal epithelium.  TMs with good light reflex and landmarks bilaterally.  Eyes: no injection, icteris, swelling, or exudate.  EOMI, PERRLA. Nose: no drainage or turbinate edema/swelling.  No injection or focal lesion.  Mouth: lips without lesion/swelling.  Oral mucosa pink and moist.  Dentition intact and without obvious caries or gingival swelling.  Oropharynx without erythema, exudate, or swelling.  Neck: supple/nontender.  No LAD, mass, or TM.  Carotid pulses 1+ bilaterally, without bruits. CV: RRR, no m/r/g.   LUNGS: CTA bilat, nonlabored resps, good aeration in all lung fields. ABD: soft, NT, mild distention but no fluid wave or flank dullness, BS normal.  No hepatospenomegaly or mass.  No bruits.  He has diastasis rectus deformity. EXT: no clubbing or cyanosis.  2-3 + pitting edema in both LL's from mid tibia down into ankles. Musculoskeletal: no joint swelling, erythema, warmth, or tenderness.  ROM of all joints intact. Skin -  Diffusely dry.  Some scattered large and medium sized pinkish oval lesions that are barely palpable on back, most with flaky "collarettes".  No vesicles or weeping lesions. Ankles: large patches of dried/crusty skin that is mildly erythematous and looks as if it had been weepy in the recent past.  Nontender.  No streaking.  +Diffuse ant tibial and ankle post-inflammatory hyperpigmentation.     Pertinent labs:  Lab Results  Component Value Date   TSH 1.85 03/10/2012   Lab Results  Component Value Date   IRON 88 03/11/2013   FERRITIN 39.4 03/15/2015    Lab Results  Component Value Date   WBC 7.4 03/15/2015   HGB 13.4 03/15/2015   HCT 40.8 03/15/2015   MCV 97.8 03/15/2015   PLT 357.0 03/15/2015   Lab Results  Component Value Date   CREATININE 0.95 10/18/2015   BUN 19 10/18/2015   NA 139 10/18/2015   K 4.5 10/18/2015   CL 100 10/18/2015   CO2 32 10/18/2015   Lab Results  Component Value Date   ALT 13  03/15/2015   AST 16 03/15/2015   ALKPHOS 79 03/15/2015   BILITOT 0.4 03/15/2015   Lab Results  Component Value Date   CHOL 204* 10/18/2015   Lab Results  Component Value Date   HDL 58.70 10/18/2015   Lab Results  Component Value Date   LDLCALC 113* 10/18/2015   Lab Results  Component Value Date   TRIG 162.0* 10/18/2015   Lab Results  Component Value Date   CHOLHDL 3 10/18/2015   Lab Results  Component Value Date   PSA 1.45 10/08/2010    ASSESSMENT AND PLAN:   1) Skin rash on back: suspect pityriasis rosea.  We'll leave this alone and monitor for resolution over the next couple months.  2) LE venous stasis dermatitis with superimposed cellulitis: I want to simplify things at this time. Stop all creams.  I eRx'd bactroban ointment to apply tid and I'll recheck this rash in 2 wks. Will get dermatologist's records.  3) Health maintenance exam: Reviewed age and gender appropriate health maintenance issues (prudent diet, regular exercise, health risks of tobacco and excessive alcohol, use of seatbelts, fire alarms in home, use of sunscreen).  Also reviewed age and gender appropriate health screening as well as vaccine recommendations. Check CBC (hx of iron def anemia) and CMET today. No longer a candidate for colon ca or prostate ca screening.  4) Wt loss with increased abd girth: At the end of today's visit, he brings up a question: "I've been losing wt but my belly is getting fatter, why is that?"  I said we would have to start addressing this question at his next f/u in 2 wks.  An After Visit Summary was printed and given to the patient.  FOLLOW UP:  Return in about 2 weeks (around 04/30/2016) for f/u rash and wt loss (30 min).  Signed:  Crissie Sickles, MD           04/16/2016

## 2016-04-16 NOTE — Progress Notes (Signed)
Pre visit review using our clinic review tool, if applicable. No additional management support is needed unless otherwise documented below in the visit note. 

## 2016-04-17 LAB — CBC WITH DIFFERENTIAL/PLATELET
BASOS PCT: 0.3 % (ref 0.0–3.0)
Basophils Absolute: 0 10*3/uL (ref 0.0–0.1)
EOS PCT: 2.4 % (ref 0.0–5.0)
Eosinophils Absolute: 0.2 10*3/uL (ref 0.0–0.7)
HEMATOCRIT: 41.2 % (ref 39.0–52.0)
HEMOGLOBIN: 13.8 g/dL (ref 13.0–17.0)
LYMPHS PCT: 9.4 % — AB (ref 12.0–46.0)
Lymphs Abs: 0.9 10*3/uL (ref 0.7–4.0)
MCHC: 33.5 g/dL (ref 30.0–36.0)
MCV: 97.8 fl (ref 78.0–100.0)
MONO ABS: 0.7 10*3/uL (ref 0.1–1.0)
MONOS PCT: 7.3 % (ref 3.0–12.0)
Neutro Abs: 7.6 10*3/uL (ref 1.4–7.7)
Neutrophils Relative %: 80.6 % — ABNORMAL HIGH (ref 43.0–77.0)
Platelets: 430 10*3/uL — ABNORMAL HIGH (ref 150.0–400.0)
RBC: 4.21 Mil/uL — AB (ref 4.22–5.81)
RDW: 15.7 % — AB (ref 11.5–15.5)
WBC: 9.4 10*3/uL (ref 4.0–10.5)

## 2016-04-22 ENCOUNTER — Ambulatory Visit (INDEPENDENT_AMBULATORY_CARE_PROVIDER_SITE_OTHER): Payer: Medicare Other | Admitting: Family Medicine

## 2016-04-22 ENCOUNTER — Encounter: Payer: Self-pay | Admitting: Family Medicine

## 2016-04-22 ENCOUNTER — Telehealth: Payer: Self-pay | Admitting: Family Medicine

## 2016-04-22 VITALS — BP 123/67 | HR 53 | Temp 98.0°F | Resp 16 | Ht 69.0 in | Wt 210.5 lb

## 2016-04-22 DIAGNOSIS — L97929 Non-pressure chronic ulcer of unspecified part of left lower leg with unspecified severity: Principal | ICD-10-CM

## 2016-04-22 DIAGNOSIS — L97919 Non-pressure chronic ulcer of unspecified part of right lower leg with unspecified severity: Principal | ICD-10-CM

## 2016-04-22 DIAGNOSIS — I83019 Varicose veins of right lower extremity with ulcer of unspecified site: Secondary | ICD-10-CM

## 2016-04-22 DIAGNOSIS — I83029 Varicose veins of left lower extremity with ulcer of unspecified site: Principal | ICD-10-CM

## 2016-04-22 DIAGNOSIS — L309 Dermatitis, unspecified: Secondary | ICD-10-CM | POA: Diagnosis not present

## 2016-04-22 DIAGNOSIS — I872 Venous insufficiency (chronic) (peripheral): Secondary | ICD-10-CM | POA: Diagnosis not present

## 2016-04-22 MED ORDER — FUROSEMIDE 40 MG PO TABS: 40.0000 mg | ORAL_TABLET | Freq: Every day | ORAL | 0 refills | Status: DC

## 2016-04-22 NOTE — Telephone Encounter (Signed)
Can he come in today for an o/v?

## 2016-04-22 NOTE — Telephone Encounter (Signed)
Apt made for today at 3:15pm as work in.

## 2016-04-22 NOTE — Progress Notes (Signed)
OFFICE VISIT  04/22/2016   CC:  Chief Complaint  Patient presents with  . Rash    on leg now drainging    HPI:    Patient is a 82 y.o. Caucasian male who presents for ongoing problem with rash (extensive) and LE edema with stasis dermatitis.  Says the ankles started to weep clear fluid yesterday.  No pain.  His rash tches only if he rubs it.  No f/c/malaise. Last time I saw him I recommended he simplify things and just apply bactroban ointment to lower extremity rash.  His LE edema has been unchanged--still a problem.  Past Medical History:  Diagnosis Date  . Anemia 02/2012   Mild iron deficiency  . Arthritis   . Ataxia 2014/15   brain MRI 02/2014 showed encephalomalacia parietal and occip lobes c/w old infarcts, plus diffuse cerebral atrophy, o/w normal  . Cataract   . DDD (degenerative disc disease)    L-spine  . GERD (gastroesophageal reflux disease)   . Heme positive stool 02/2012   colonoscopy normal.  EGD showed small hiatal hernia with associated Cameron's erosions.  Hemoccults NEG x 3 09/2014.  Marland Kitchen Hyperlipidemia   . Hypertension   . Macular degeneration, age related    exudative R eye; nonexudative L eye.  Vitreous degeneration bilat.  . Obesity (BMI 30-39.9)   . Retinal hemorrhage 2016   w/ retinal detachment (right eye) : anticoagulants stopped due to this  . Venous insufficiency of both lower extremities     Past Surgical History:  Procedure Laterality Date  . APPENDECTOMY  1935  . cataract surg     Bilateral/ 08-13  . COLONOSCOPY  05/12/12   for iron def anemia--NORMAL RESULT  . ESOPHAGOGASTRODUODENOSCOPY  06/17/12   Small hiatal hernia with associated Cameron's erosions--likely source of his mild iron def anemia.  Marland Kitchen NASAL SINUS SURGERY     1998  . TONSILLECTOMY  1935    Outpatient Medications Prior to Visit  Medication Sig Dispense Refill  . Aflibercept (EYLEA IO) Inject into the eye. One injection into right eye every month    . Azelastine HCl (ASTEPRO)  0.15 % SOLN 2 sprays in each nostril at bedtime every night 30 mL 11  . Cholecalciferol (VITAMIN D3) 2000 UNITS TABS Take by mouth daily.      . ferrous sulfate 325 (65 FE) MG tablet Take 325 mg by mouth daily with breakfast.    . losartan-hydrochlorothiazide (HYZAAR) 100-25 MG tablet Take 1 tablet by mouth daily. 90 tablet 1  . Multiple Vitamins-Minerals (PRESERVISION AREDS 2 PO) Take by mouth 2 (two) times daily.    . multivitamin (THERAGRAN) per tablet Take 1 tablet by mouth daily.      . mupirocin ointment (BACTROBAN) 2 % Apply 1 application topically 3 (three) times daily. 30 g 0  . omeprazole (PRILOSEC) 40 MG capsule Take 1 capsule (40 mg total) by mouth daily. 90 capsule 3  . pravastatin (PRAVACHOL) 80 MG tablet TAKE 1 TABLET (80 MG TOTAL) BY MOUTH DAILY. 90 tablet 3  . tobramycin (TOBREX) 0.3 % ophthalmic solution Place 1 drop into the right eye. Four times daily begin 1 day prior to treatment  5  . verapamil (CALAN-SR) 240 MG CR tablet TAKE 1 TABLET (240 MG TOTAL) BY MOUTH 2 (TWO) TIMES DAILY. 180 tablet 0  . amLODipine (NORVASC) 5 MG tablet Take 1 tablet (5 mg total) by mouth daily. 30 tablet 11  . fluticasone (CUTIVATE) 0.05 % cream Apply to affected areas  bid prn (Patient not taking: Reported on 04/22/2016) 30 g 1   No facility-administered medications prior to visit.     No Known Allergies  ROS As per HPI  PE: Blood pressure 123/67, pulse (!) 53, temperature 98 F (36.7 C), temperature source Oral, resp. rate 16, height 5\' 9"  (1.753 m), weight 210 lb 8 oz (95.5 kg), SpO2 94 %. Gen: Alert, well appearing.  Patient is oriented to person, place, time, and situation. CV: RRR, soft systolic murmur LUNGS: CTA bilat, nonlabored resps ABD: soft, ND/NT  No abd wall pitting edema. EXT: 3+ bilat LE pitting edema, with extensive pink maculopapular, hyperkeratotic rash.  The medial ankles have approx 8-10 cm superficial desquamation/weeping moisture.  No erythema or warmth or  tenderness. SKIN: as noted on LL's above, plus the trunk has a rash consisting of scattered patches of hyperkeratotic rash, palpable/crusty.  No erythema or tenderness.  The back is extensively involved/covered, whereas the flanks have less lesions, and the abd has very few lesions. No vesicles, pustules, or petechiae.  LABS:  Lab Results  Component Value Date   TSH 1.85 03/10/2012   Lab Results  Component Value Date   WBC 9.4 04/16/2016   HGB 13.8 04/16/2016   HCT 41.2 04/16/2016   MCV 97.8 04/16/2016   PLT 430.0 (H) 04/16/2016   Lab Results  Component Value Date   CREATININE 1.03 04/16/2016   BUN 19 04/16/2016   NA 138 04/16/2016   K 3.8 04/16/2016   CL 100 04/16/2016   CO2 29 04/16/2016   Lab Results  Component Value Date   ALT 16 04/16/2016   AST 16 04/16/2016   ALKPHOS 94 04/16/2016   BILITOT 0.6 04/16/2016   Lab Results  Component Value Date   CHOL 204 (H) 10/18/2015   Lab Results  Component Value Date   HDL 58.70 10/18/2015   Lab Results  Component Value Date   LDLCALC 113 (H) 10/18/2015   Lab Results  Component Value Date   TRIG 162.0 (H) 10/18/2015   Lab Results  Component Value Date   CHOLHDL 3 10/18/2015   Lab Results  Component Value Date   PSA 1.45 10/08/2010     IMPRESSION AND PLAN:  1) Bilat LE venous insufficiency edema, with venous stasis dermatitis and now superficial skin ulceration medially on both legs. Plan: stop amlodipine 5mg  due to potential edema side effect. Start lasix 40mg  qd. Continue to apply bactroban ointment to medial ankles, cover moist areas. Refer to wound care clinic (possibly get UNA boot (s) applied?). He is easy to confuse regarding plans, so will not do an echo right now, but I let him know I will likely get one next week after I see him.  Will repeat BMET at that time.  2) Rash, nonspecific eczematous dermatitis, but I still recommend we do no treatment at this time. He says it has been present for a year  and getting worse.  Sounds like his dermatologist was treating it as eczema but pt is not clear about whether or not he responded to such treatments.  The truncal rash is reminiscent of pityriasis rosea.   Reassured pt, watchful waiting for now.  An After Visit Summary was printed and given to the patient.  FOLLOW UP: Return for keep appt set for next Tuesday.  Signed:  Crissie Sickles, MD           04/22/2016

## 2016-04-22 NOTE — Telephone Encounter (Signed)
Please advise. Thanks.  

## 2016-04-22 NOTE — Telephone Encounter (Signed)
Yes, do you want me to call him?

## 2016-04-22 NOTE — Patient Instructions (Signed)
Continue to apply bactroban ointment to both lower legs.  Stop amlodipine.  Start lasix 40mg --one tab per day.

## 2016-04-22 NOTE — Progress Notes (Signed)
Pre visit review using our clinic review tool, if applicable. No additional management support is needed unless otherwise documented below in the visit note. 

## 2016-04-23 DIAGNOSIS — H35412 Lattice degeneration of retina, left eye: Secondary | ICD-10-CM | POA: Diagnosis not present

## 2016-04-23 DIAGNOSIS — H43813 Vitreous degeneration, bilateral: Secondary | ICD-10-CM | POA: Diagnosis not present

## 2016-04-23 DIAGNOSIS — H353122 Nonexudative age-related macular degeneration, left eye, intermediate dry stage: Secondary | ICD-10-CM | POA: Diagnosis not present

## 2016-04-23 DIAGNOSIS — H353211 Exudative age-related macular degeneration, right eye, with active choroidal neovascularization: Secondary | ICD-10-CM | POA: Diagnosis not present

## 2016-04-26 ENCOUNTER — Ambulatory Visit (INDEPENDENT_AMBULATORY_CARE_PROVIDER_SITE_OTHER): Payer: Medicare Other | Admitting: Family Medicine

## 2016-04-26 ENCOUNTER — Other Ambulatory Visit: Payer: Self-pay | Admitting: Family Medicine

## 2016-04-26 ENCOUNTER — Encounter: Payer: Self-pay | Admitting: Family Medicine

## 2016-04-26 VITALS — BP 127/64 | HR 55 | Temp 98.0°F | Resp 16 | Ht 69.0 in | Wt 204.5 lb

## 2016-04-26 DIAGNOSIS — R21 Rash and other nonspecific skin eruption: Secondary | ICD-10-CM | POA: Diagnosis not present

## 2016-04-26 DIAGNOSIS — L309 Dermatitis, unspecified: Secondary | ICD-10-CM

## 2016-04-26 DIAGNOSIS — R609 Edema, unspecified: Secondary | ICD-10-CM | POA: Diagnosis not present

## 2016-04-26 LAB — CBC WITH DIFFERENTIAL/PLATELET
BASOS PCT: 0.3 % (ref 0.0–3.0)
Basophils Absolute: 0 10*3/uL (ref 0.0–0.1)
EOS ABS: 0.2 10*3/uL (ref 0.0–0.7)
Eosinophils Relative: 1.9 % (ref 0.0–5.0)
HEMATOCRIT: 40.2 % (ref 39.0–52.0)
Hemoglobin: 13.4 g/dL (ref 13.0–17.0)
Lymphocytes Relative: 8.5 % — ABNORMAL LOW (ref 12.0–46.0)
Lymphs Abs: 1 10*3/uL (ref 0.7–4.0)
MCHC: 33.4 g/dL (ref 30.0–36.0)
MCV: 98.2 fl (ref 78.0–100.0)
MONO ABS: 1.1 10*3/uL — AB (ref 0.1–1.0)
Monocytes Relative: 9.3 % (ref 3.0–12.0)
NEUTROS ABS: 9.2 10*3/uL — AB (ref 1.4–7.7)
Neutrophils Relative %: 80 % — ABNORMAL HIGH (ref 43.0–77.0)
PLATELETS: 383 10*3/uL (ref 150.0–400.0)
RBC: 4.1 Mil/uL — ABNORMAL LOW (ref 4.22–5.81)
RDW: 15.4 % (ref 11.5–15.5)
WBC: 11.5 10*3/uL — ABNORMAL HIGH (ref 4.0–10.5)

## 2016-04-26 LAB — BASIC METABOLIC PANEL
BUN: 29 mg/dL — AB (ref 6–23)
CHLORIDE: 98 meq/L (ref 96–112)
CO2: 32 meq/L (ref 19–32)
CREATININE: 1.19 mg/dL (ref 0.40–1.50)
Calcium: 9.5 mg/dL (ref 8.4–10.5)
GFR: 61.5 mL/min (ref >60.00)
Glucose, Bld: 128 mg/dL — ABNORMAL HIGH (ref 70–99)
Potassium: 2.9 mEq/L — ABNORMAL LOW (ref 3.5–5.1)
Sodium: 140 mEq/L (ref 135–145)

## 2016-04-26 MED ORDER — CEPHALEXIN 500 MG PO CAPS: 500.0000 mg | ORAL_CAPSULE | Freq: Two times a day (BID) | ORAL | 0 refills | Status: DC

## 2016-04-26 MED ORDER — POTASSIUM CHLORIDE CRYS ER 20 MEQ PO TBCR
EXTENDED_RELEASE_TABLET | ORAL | 1 refills | Status: DC
Start: 2016-04-26 — End: 2016-05-17

## 2016-04-26 MED ORDER — PREDNISONE 20 MG PO TABS: ORAL_TABLET | ORAL | 0 refills | Status: DC

## 2016-04-26 NOTE — Progress Notes (Signed)
OFFICE VISIT  04/26/2016   CC:  Chief Complaint  Patient presents with  . Follow-up    sores on ankles   HPI:    Patient is a 82 y.o. Caucasian male who presents for 4 day recheck of the venous stasis ulcers on his ankles. There was concern by pt and daughter that they looked worse so we worked him in today. He has been applying bactroban ointment to moist areas.   His truncal rash continues to spread.  It is mildly itchy. No fevers.  No malaise.  He says he doesn't feel like eating.    Past Medical History:  Diagnosis Date  . Anemia 02/2012   Mild iron deficiency  . Arthritis   . Ataxia 2014/15   brain MRI 02/2014 showed encephalomalacia parietal and occip lobes c/w old infarcts, plus diffuse cerebral atrophy, o/w normal  . Cataract   . DDD (degenerative disc disease)    L-spine  . GERD (gastroesophageal reflux disease)   . Heme positive stool 02/2012   colonoscopy normal.  EGD showed small hiatal hernia with associated Cameron's erosions.  Hemoccults NEG x 3 09/2014.  Marland Kitchen Hyperlipidemia   . Hypertension   . Macular degeneration, age related    exudative R eye; nonexudative L eye.  Vitreous degeneration bilat.  . Obesity (BMI 30-39.9)   . Retinal hemorrhage 2016   w/ retinal detachment (right eye) : anticoagulants stopped due to this  . Venous insufficiency of both lower extremities     Past Surgical History:  Procedure Laterality Date  . APPENDECTOMY  1935  . cataract surg     Bilateral/ 08-13  . COLONOSCOPY  05/12/12   for iron def anemia--NORMAL RESULT  . ESOPHAGOGASTRODUODENOSCOPY  06/17/12   Small hiatal hernia with associated Cameron's erosions--likely source of his mild iron def anemia.  Marland Kitchen NASAL SINUS SURGERY     1998  . TONSILLECTOMY  1935    Outpatient Medications Prior to Visit  Medication Sig Dispense Refill  . Aflibercept (EYLEA IO) Inject into the eye. One injection into right eye every month    . Azelastine HCl (ASTEPRO) 0.15 % SOLN 2 sprays in each  nostril at bedtime every night 30 mL 11  . Cholecalciferol (VITAMIN D3) 2000 UNITS TABS Take by mouth daily.      . ferrous sulfate 325 (65 FE) MG tablet Take 325 mg by mouth daily with breakfast.    . furosemide (LASIX) 40 MG tablet Take 1 tablet (40 mg total) by mouth daily. 30 tablet 0  . losartan-hydrochlorothiazide (HYZAAR) 100-25 MG tablet Take 1 tablet by mouth daily. 90 tablet 1  . Multiple Vitamins-Minerals (PRESERVISION AREDS 2 PO) Take by mouth 2 (two) times daily.    . multivitamin (THERAGRAN) per tablet Take 1 tablet by mouth daily.      . mupirocin ointment (BACTROBAN) 2 % Apply 1 application topically 3 (three) times daily. 30 g 0  . omeprazole (PRILOSEC) 40 MG capsule Take 1 capsule (40 mg total) by mouth daily. 90 capsule 3  . pravastatin (PRAVACHOL) 80 MG tablet TAKE 1 TABLET (80 MG TOTAL) BY MOUTH DAILY. 90 tablet 3  . tobramycin (TOBREX) 0.3 % ophthalmic solution Place 1 drop into the right eye. Four times daily begin 1 day prior to treatment  5  . verapamil (CALAN-SR) 240 MG CR tablet TAKE 1 TABLET (240 MG TOTAL) BY MOUTH 2 (TWO) TIMES DAILY. 180 tablet 0   No facility-administered medications prior to visit.  No Known Allergies  ROS As per HPI  PE: Blood pressure 127/64, pulse (!) 55, temperature 98 F (36.7 C), temperature source Oral, resp. rate 16, height 5\' 9"  (1.753 m), weight 204 lb 8 oz (92.8 kg), SpO2 95 %. Gen: Alert, well appearing.  Patient is oriented to person, place, time, and situation. AFFECT: pleasant, lucid thought and speech. SKIN: scattered patches of hyperkeratotic pinkish plaques, some areas coalesced, worse on trunk and hands and lower legs.  Maceration on lower legs seems to still be w/o sign of infection but there are now a few additional small areas on side of feet.  LE edema still 2-3+ from mid LL down into feet.    LABS:    Chemistry      Component Value Date/Time   NA 138 04/16/2016 0938   K 3.8 04/16/2016 0938   CL 100  04/16/2016 0938   CO2 29 04/16/2016 0938   BUN 19 04/16/2016 0938   CREATININE 1.03 04/16/2016 0938      Component Value Date/Time   CALCIUM 9.5 04/16/2016 0938   ALKPHOS 94 04/16/2016 0938   AST 16 04/16/2016 0938   ALT 16 04/16/2016 0938   BILITOT 0.6 04/16/2016 0938      IMPRESSION AND PLAN:  Rash, suspect dermatitis but I don't know what this is coming from. I have been trying to hold off from treatment but at this time I feel like I need to start trial of prednisone 40mg  qd x 7d, then 20mg  qd x 7d, then 10mg  qd x 6d.  Also, keflex 500 mg bid x 10d. Will make referral to [facility] dermatology for 2nd opinion (he has been seeing Dr. Altamese Cabal here locally).  Continue with plan for lasix 40mg  qd and we'll check BMET today since he's been on this 4-5 d. Continue with plan to see wound clinic--appt has been made. Wounds were redressed today.  An After Visit Summary was printed and given to the patient.  FOLLOW UP: Return for keep appt set for 05/03/16.  Signed:  Crissie Sickles, MD           04/26/2016

## 2016-04-26 NOTE — Progress Notes (Signed)
Pre visit review using our clinic review tool, if applicable. No additional management support is needed unless otherwise documented below in the visit note. 

## 2016-04-29 ENCOUNTER — Ambulatory Visit: Payer: Medicare Other | Admitting: Family Medicine

## 2016-04-30 ENCOUNTER — Ambulatory Visit (INDEPENDENT_AMBULATORY_CARE_PROVIDER_SITE_OTHER): Payer: Medicare Other | Admitting: Family Medicine

## 2016-04-30 ENCOUNTER — Encounter: Payer: Self-pay | Admitting: Family Medicine

## 2016-04-30 VITALS — BP 118/66 | HR 72 | Temp 97.8°F | Resp 16 | Ht 69.0 in | Wt 210.5 lb

## 2016-04-30 DIAGNOSIS — R634 Abnormal weight loss: Secondary | ICD-10-CM | POA: Diagnosis not present

## 2016-04-30 DIAGNOSIS — I872 Venous insufficiency (chronic) (peripheral): Secondary | ICD-10-CM

## 2016-04-30 DIAGNOSIS — I8311 Varicose veins of right lower extremity with inflammation: Secondary | ICD-10-CM

## 2016-04-30 DIAGNOSIS — L309 Dermatitis, unspecified: Secondary | ICD-10-CM

## 2016-04-30 DIAGNOSIS — I8312 Varicose veins of left lower extremity with inflammation: Secondary | ICD-10-CM

## 2016-04-30 NOTE — Progress Notes (Signed)
Pre visit review using our clinic review tool, if applicable. No additional management support is needed unless otherwise documented below in the visit note. 

## 2016-04-30 NOTE — Progress Notes (Signed)
OFFICE VISIT  04/30/2016   CC:  Chief Complaint  Patient presents with  . Follow-up    rash and weight loss   HPI:    Patient is a 82 y.o. Caucasian male who presents for 4 DAY f/u body rash and LE edema with stasis derm rash. Last visit I started keflex and a prednisone taper.  His ankle rash is improved and there is no more weeping! There is less LE edema secondary to his being on lasix.   The rash on his trunk and arms is stable, seems to be peeling off more now.  No itching or pain.  No fevers. No n/v/d.     He has a relatively recent hx of losing some wt: about 5 lbs.  He noted that he went through a period of poor appetite and poor intake during that time.  His appetite is back to normal and his weight is now back to his baseline of 210-211 lbs, which he has been around for the last 30 yrs.  No CP or SOB, no cough, no abd pain, no dizziness, no HA, no palpitations.  Past Medical History:  Diagnosis Date  . Anemia 02/2012   Mild iron deficiency  . Arthritis   . Ataxia 2014/15   brain MRI 02/2014 showed encephalomalacia parietal and occip lobes c/w old infarcts, plus diffuse cerebral atrophy, o/w normal  . Cataract   . DDD (degenerative disc disease)    L-spine  . GERD (gastroesophageal reflux disease)   . Heme positive stool 02/2012   colonoscopy normal.  EGD showed small hiatal hernia with associated Cameron's erosions.  Hemoccults NEG x 3 09/2014.  Marland Kitchen Hyperlipidemia   . Hypertension   . Macular degeneration, age related    exudative R eye; nonexudative L eye.  Vitreous degeneration bilat.  . Obesity (BMI 30-39.9)   . Retinal hemorrhage 2016   w/ retinal detachment (right eye) : anticoagulants stopped due to this  . Venous insufficiency of both lower extremities     Past Surgical History:  Procedure Laterality Date  . APPENDECTOMY  1935  . cataract surg     Bilateral/ 08-13  . COLONOSCOPY  05/12/12   for iron def anemia--NORMAL RESULT  . ESOPHAGOGASTRODUODENOSCOPY   06/17/12   Small hiatal hernia with associated Cameron's erosions--likely source of his mild iron def anemia.  Marland Kitchen NASAL SINUS SURGERY     1998  . TONSILLECTOMY  1935    Outpatient Medications Prior to Visit  Medication Sig Dispense Refill  . Aflibercept (EYLEA IO) Inject into the eye. One injection into right eye every month    . Azelastine HCl (ASTEPRO) 0.15 % SOLN 2 sprays in each nostril at bedtime every night 30 mL 11  . cephALEXin (KEFLEX) 500 MG capsule Take 1 capsule (500 mg total) by mouth 2 (two) times daily. 30 capsule 0  . Cholecalciferol (VITAMIN D3) 2000 UNITS TABS Take by mouth daily.      . ferrous sulfate 325 (65 FE) MG tablet Take 325 mg by mouth daily with breakfast.    . furosemide (LASIX) 40 MG tablet Take 1 tablet (40 mg total) by mouth daily. 30 tablet 0  . losartan-hydrochlorothiazide (HYZAAR) 100-25 MG tablet Take 1 tablet by mouth daily. 90 tablet 1  . Multiple Vitamins-Minerals (PRESERVISION AREDS 2 PO) Take by mouth 2 (two) times daily.    . multivitamin (THERAGRAN) per tablet Take 1 tablet by mouth daily.      . mupirocin ointment (BACTROBAN) 2 %  Apply 1 application topically 3 (three) times daily. 30 g 0  . omeprazole (PRILOSEC) 40 MG capsule Take 1 capsule (40 mg total) by mouth daily. 90 capsule 3  . potassium chloride SA (K-DUR,KLOR-CON) 20 MEQ tablet 2 tabs po bid for 2 days, then decrease to 2 tabs po once daily as maintenance dose 36 tablet 1  . pravastatin (PRAVACHOL) 80 MG tablet TAKE 1 TABLET (80 MG TOTAL) BY MOUTH DAILY. 90 tablet 3  . predniSONE (DELTASONE) 20 MG tablet 2 tabs po qd x 7d, then 1 tab po qd x 7d, then 1/2 tab po qd x 6d 24 tablet 0  . tobramycin (TOBREX) 0.3 % ophthalmic solution Place 1 drop into the right eye. Four times daily begin 1 day prior to treatment  5  . verapamil (CALAN-SR) 240 MG CR tablet TAKE 1 TABLET (240 MG TOTAL) BY MOUTH 2 (TWO) TIMES DAILY. 180 tablet 0   No facility-administered medications prior to visit.     No  Known Allergies  ROS As per HPI  PE: Blood pressure 118/66, pulse 72, temperature 97.8 F (36.6 C), temperature source Oral, resp. rate 16, height 5\' 9"  (1.753 m), weight 210 lb 8 oz (95.5 kg), SpO2 94 %. Gen: Alert, well appearing.  Patient is oriented to person, place, time, and situation. AFFECT: pleasant, lucid thought and speech. SKIN: diffusely scattered pinkish plaques with superficial peeling all over his trunk, back>front, and both arms. Face and scalp are w/out rash. LEGS: Edema 3+ above sock line bilat, but only 2+ below sock line.  He has hyperkeratotic, peeling, deep pink rash on ankles bilat, without any open wound or weeping fluid.  No tenderness.    LABS:  Lab Results  Component Value Date   [id number] 03/10/2012   Lab Results  Component Value Date   TSH 1.85 03/10/2012   Lab Results  Component Value Date   WBC 11.5 (H) 04/26/2016   HGB 13.4 04/26/2016   HCT 40.2 04/26/2016   MCV 98.2 04/26/2016   PLT 383.0 04/26/2016   Lab Results  Component Value Date   CREATININE 1.19 04/26/2016   BUN 29 (H) 04/26/2016   NA 140 04/26/2016   K 2.9 (L) 04/26/2016   CL 98 04/26/2016   CO2 32 04/26/2016   Lab Results  Component Value Date   ALT 16 04/16/2016   AST 16 04/16/2016   ALKPHOS 94 04/16/2016   BILITOT 0.6 04/16/2016   Lab Results  Component Value Date   CHOL 204 (H) 10/18/2015   Lab Results  Component Value Date   HDL 58.70 10/18/2015   Lab Results  Component Value Date   LDLCALC 113 (H) 10/18/2015   Lab Results  Component Value Date   TRIG 162.0 (H) 10/18/2015   Lab Results  Component Value Date   CHOLHDL 3 10/18/2015   Lab Results  Component Value Date   IRON 88 03/11/2013   FERRITIN 39.4 03/15/2015    IMPRESSION AND PLAN:  1) Rash/dermatitis--trunk and upper extremities: stable.  Continue prednisone taper. I have made a referral to [facility] derm for 2nd opinion. Fortunately the rash is asymptomatic.  2) LE venous stasis edema +  stasis dermatitis. Improving--no more open wound/weeping.  Will cancel his wound clinic appointment. He had low K after getting on lasix, so we have cut his dose down to 20mg  qd and started [facility]. He'll finish the keflex I rx'd last visit. I gave him a low Na dietary handout today and strongly encouraged  him to limit Na. Discussed elevation of legs above the level of his heart. When his LE rash is significantly improved, I'll get him started wearing compression stockings and I'll also add a keratolytic like 40% urea foam to apply to ankles.  3) Weight loss: not to a clinically significant degree.  Now his weight is back at baseline. He admits that this was due to a period of poor appetite and intake that he is now past. Watchful waiting.  An After Visit Summary was printed and given to the patient.  FOLLOW UP: Return in about 1 week (around 05/07/2016) for f/u legs.  Signed:  Crissie Sickles, MD           04/30/2016

## 2016-05-03 ENCOUNTER — Encounter (HOSPITAL_BASED_OUTPATIENT_CLINIC_OR_DEPARTMENT_OTHER): Payer: Medicare Other

## 2016-05-06 ENCOUNTER — Ambulatory Visit: Payer: Medicare Other | Admitting: Family Medicine

## 2016-05-09 DIAGNOSIS — L239 Allergic contact dermatitis, unspecified cause: Secondary | ICD-10-CM | POA: Diagnosis not present

## 2016-05-09 DIAGNOSIS — I878 Other specified disorders of veins: Secondary | ICD-10-CM | POA: Diagnosis not present

## 2016-05-10 ENCOUNTER — Telehealth: Payer: Self-pay | Admitting: Family Medicine

## 2016-05-10 ENCOUNTER — Encounter: Payer: Self-pay | Admitting: Family Medicine

## 2016-05-10 NOTE — Telephone Encounter (Signed)
Opened in error

## 2016-05-13 ENCOUNTER — Other Ambulatory Visit: Payer: Self-pay | Admitting: *Deleted

## 2016-05-13 DIAGNOSIS — I10 Essential (primary) hypertension: Secondary | ICD-10-CM

## 2016-05-13 MED ORDER — VERAPAMIL HCL ER 240 MG PO TBCR: EXTENDED_RELEASE_TABLET | ORAL | 3 refills | Status: DC

## 2016-05-13 NOTE — Telephone Encounter (Signed)
[facility] OR  RF request for verapamil LOV: 04/16/16 Next ov: 05/17/16 Last written: 02/06/16 #180 w/ 0RF

## 2016-05-17 ENCOUNTER — Ambulatory Visit (INDEPENDENT_AMBULATORY_CARE_PROVIDER_SITE_OTHER): Payer: Medicare Other | Admitting: Family Medicine

## 2016-05-17 ENCOUNTER — Encounter: Payer: Self-pay | Admitting: Family Medicine

## 2016-05-17 VITALS — BP 142/91 | HR 75 | Temp 98.1°F | Resp 16 | Ht 69.0 in | Wt 208.8 lb

## 2016-05-17 DIAGNOSIS — L259 Unspecified contact dermatitis, unspecified cause: Secondary | ICD-10-CM

## 2016-05-17 DIAGNOSIS — I8312 Varicose veins of left lower extremity with inflammation: Secondary | ICD-10-CM

## 2016-05-17 DIAGNOSIS — I8311 Varicose veins of right lower extremity with inflammation: Secondary | ICD-10-CM

## 2016-05-17 DIAGNOSIS — E876 Hypokalemia: Secondary | ICD-10-CM | POA: Diagnosis not present

## 2016-05-17 DIAGNOSIS — I872 Venous insufficiency (chronic) (peripheral): Secondary | ICD-10-CM

## 2016-05-17 LAB — BASIC METABOLIC PANEL
BUN: 15 mg/dL (ref 6–23)
CALCIUM: 9.4 mg/dL (ref 8.4–10.5)
CO2: 33 mEq/L — ABNORMAL HIGH (ref 19–32)
Chloride: 100 mEq/L (ref 96–112)
Creatinine, Ser: 0.95 mg/dL (ref 0.40–1.50)
GFR: 79.75 mL/min (ref >60.00)
Glucose, Bld: 95 mg/dL (ref 70–99)
POTASSIUM: 4.2 meq/L (ref 3.5–5.1)
SODIUM: 141 meq/L (ref 135–145)

## 2016-05-17 MED ORDER — POTASSIUM CHLORIDE CRYS ER 20 MEQ PO TBCR: EXTENDED_RELEASE_TABLET | ORAL | 3 refills | Status: DC

## 2016-05-17 MED ORDER — FUROSEMIDE 40 MG PO TABS: ORAL_TABLET | ORAL | 3 refills | Status: DC

## 2016-05-17 NOTE — Patient Instructions (Signed)
Continue 1/2 tab of lasix once daily. Continue 2 potassium pills daily. Apply the ointment the dermatologist gave you to your ankles and to itchy parts of your skin.

## 2016-05-17 NOTE — Progress Notes (Signed)
Pre visit review using our clinic review tool, if applicable. No additional management support is needed unless otherwise documented below in the visit note. 

## 2016-05-17 NOTE — Progress Notes (Signed)
OFFICE VISIT  05/17/2016   CC:  Chief Complaint  Patient presents with  . Follow-up    legs     HPI:    Patient is a 82 y.o. Caucasian male who presents for 2 week f/u rash/dermatitis. Finished steroid taper yesterday.  Saw [facility] derm since last visit here, ?contact dermatitis was dx.  F/u was left "open ended". Sometimes rash itches, sometimes it doesn't.   LE swelling is better but still present.   Arms and body rash seems to wax and wane.  He denies contact with anything new/suspicious that he could be reacting to.  ROS: no fever or malaise, no pain in lower legs.  Past Medical History:  Diagnosis Date  . Anemia 02/2012   Mild iron deficiency  . Arthritis   . Ataxia 2014/15   brain MRI 02/2014 showed encephalomalacia parietal and occip lobes c/w old infarcts, plus diffuse cerebral atrophy, o/w normal  . Cataract   . DDD (degenerative disc disease)    L-spine  . Dermatitis    ?Contact derm? per Konrad Saha MD: Dr. Threasa Alpha (05/09/16)  . GERD (gastroesophageal reflux disease)   . Heme positive stool 02/2012   colonoscopy normal.  EGD showed small hiatal hernia with associated Cameron's erosions.  Hemoccults NEG x 3 09/2014.  Marland Kitchen Hyperlipidemia   . Hypertension   . Macular degeneration, age related    exudative R eye; nonexudative L eye.  Vitreous degeneration bilat.  . Obesity (BMI 30-39.9)   . Retinal hemorrhage 2016   w/ retinal detachment (right eye) : anticoagulants stopped due to this  . Venous insufficiency of both lower extremities     Past Surgical History:  Procedure Laterality Date  . APPENDECTOMY  1935  . cataract surg     Bilateral/ 08-13  . COLONOSCOPY  05/12/12   for iron def anemia--NORMAL RESULT  . ESOPHAGOGASTRODUODENOSCOPY  06/17/12   Small hiatal hernia with associated Cameron's erosions--likely source of his mild iron def anemia.  Marland Kitchen NASAL SINUS SURGERY     1998  . TONSILLECTOMY  1935    Outpatient Medications Prior to Visit  Medication Sig  Dispense Refill  . Aflibercept (EYLEA IO) Inject into the eye. One injection into right eye every month    . Azelastine HCl (ASTEPRO) 0.15 % SOLN 2 sprays in each nostril at bedtime every night (Patient taking differently: 2 sprays in each nostril at bedtime every night as needed) 30 mL 11  . Cholecalciferol (VITAMIN D3) 2000 UNITS TABS Take by mouth daily.      . ferrous sulfate 325 (65 FE) MG tablet Take 325 mg by mouth daily with breakfast.    . losartan-hydrochlorothiazide (HYZAAR) 100-25 MG tablet Take 1 tablet by mouth daily. 90 tablet 1  . Multiple Vitamins-Minerals (PRESERVISION AREDS 2 PO) Take by mouth 2 (two) times daily.    . multivitamin (THERAGRAN) per tablet Take 1 tablet by mouth daily.      . mupirocin ointment (BACTROBAN) 2 % Apply 1 application topically 3 (three) times daily. 30 g 0  . omeprazole (PRILOSEC) 40 MG capsule Take 1 capsule (40 mg total) by mouth daily. 90 capsule 3  . pravastatin (PRAVACHOL) 80 MG tablet TAKE 1 TABLET (80 MG TOTAL) BY MOUTH DAILY. 90 tablet 3  . tobramycin (TOBREX) 0.3 % ophthalmic solution Place 1 drop into the right eye. Four times daily begin 1 day prior to treatment  5  . verapamil (CALAN-SR) 240 MG CR tablet TAKE 1 TABLET (240 MG  TOTAL) BY MOUTH 2 (TWO) TIMES DAILY. 180 tablet 3  . furosemide (LASIX) 40 MG tablet Take 1 tablet (40 mg total) by mouth daily. 30 tablet 0  . predniSONE (DELTASONE) 20 MG tablet 2 tabs po qd x 7d, then 1 tab po qd x 7d, then 1/2 tab po qd x 6d (Patient not taking: Reported on 05/17/2016) 24 tablet 0  . cephALEXin (KEFLEX) 500 MG capsule Take 1 capsule (500 mg total) by mouth 2 (two) times daily. (Patient not taking: Reported on 05/17/2016) 30 capsule 0  . potassium chloride SA (K-DUR,KLOR-CON) 20 MEQ tablet 2 tabs po bid for 2 days, then decrease to 2 tabs po once daily as maintenance dose (Patient not taking: Reported on 05/17/2016) 36 tablet 1   No facility-administered medications prior to visit.     No Known  Allergies  ROS As per HPI  PE: Blood pressure (!) 142/91, pulse 75, temperature 98.1 F (36.7 C), temperature source Oral, resp. rate 16, height 5\' 9"  (1.753 m), weight 208 lb 12 oz (94.7 kg), SpO2 94 %. Gen: Alert, well appearing.  Patient is oriented to person, place, time, and situation. AFFECT: pleasant, lucid thought and speech. SKIN: trunk, arms, and legs (lower>upper) with diffuse pinkish peeling rash with a few more defined pink patches scattered on anterior trunk.  No vesicles, no hives, no pustules, no petechiae. Ankles/pretibial regions with deep pinkish, flaky/hyperkeratotic rash with warmth--c/w stasis dermatitis.  2+ pitting edema in both LL's.  No openings in the skin are present.  LABS:    Chemistry      Component Value Date/Time   NA 140 04/26/2016 1123   K 2.9 (L) 04/26/2016 1123   CL 98 04/26/2016 1123   CO2 32 04/26/2016 1123   BUN 29 (H) 04/26/2016 1123   CREATININE 1.19 04/26/2016 1123      Component Value Date/Time   CALCIUM 9.5 04/26/2016 1123   ALKPHOS 94 04/16/2016 0938   AST 16 04/16/2016 0938   ALT 16 04/16/2016 0938   BILITOT 0.6 04/16/2016 0938       IMPRESSION AND PLAN:  1) Diffuse dermatitis, question of contact dermatitis per recent dermatology evaluation. Patient will continue applying the topical steroid that the dermatologist rx'd him---mainly to the itchy areas since his rash is so widespread.  I'll refer to allergist for consideration of patch testing.  2) LE venous stasis edema with stasis dermatitis: improved (was ulcerated at one point in time). Continue topical steroid. Continue lasix 1/2 of 40mg  qd + potassium 20 mEQ 2 tabs qd. Recheck BMET today.  3) Hx of hypokalemai: from lasix use.  I have cut back on his lasix dose since last BMET and he has been on potassium supplement since that time as well.  Recheck BMET today.  An After Visit Summary was printed and given to the patient.  FOLLOW UP: Return in about 4 weeks (around  06/14/2016) for f/u legs/skin.  Signed:  Crissie Sickles, MD           05/17/2016

## 2016-05-19 ENCOUNTER — Other Ambulatory Visit: Payer: Self-pay | Admitting: Family Medicine

## 2016-05-27 ENCOUNTER — Encounter: Payer: Self-pay | Admitting: Family Medicine

## 2016-05-28 DIAGNOSIS — L209 Atopic dermatitis, unspecified: Secondary | ICD-10-CM | POA: Diagnosis not present

## 2016-05-28 DIAGNOSIS — I872 Venous insufficiency (chronic) (peripheral): Secondary | ICD-10-CM | POA: Diagnosis not present

## 2016-05-28 DIAGNOSIS — L239 Allergic contact dermatitis, unspecified cause: Secondary | ICD-10-CM | POA: Diagnosis not present

## 2016-06-11 DIAGNOSIS — L299 Pruritus, unspecified: Secondary | ICD-10-CM | POA: Diagnosis not present

## 2016-06-11 DIAGNOSIS — L249 Irritant contact dermatitis, unspecified cause: Secondary | ICD-10-CM | POA: Diagnosis not present

## 2016-06-13 ENCOUNTER — Telehealth: Payer: Self-pay | Admitting: Family Medicine

## 2016-06-13 NOTE — Telephone Encounter (Signed)
Patient advised, expressed understanding.  

## 2016-06-13 NOTE — Telephone Encounter (Signed)
Patient requesting to stop potassium, please advise?    Patient also inquiring what GFR is and why his is elevated?   Please advise.

## 2016-06-13 NOTE — Telephone Encounter (Signed)
No, he needs to stay on potassium as long as he is taking the fluid pill I have him on (lasix/furosemide). Also, his GFR---which is a measurement of kidney function---is normal.  Reassure him.-thx

## 2016-06-18 ENCOUNTER — Ambulatory Visit (INDEPENDENT_AMBULATORY_CARE_PROVIDER_SITE_OTHER): Payer: Medicare Other | Admitting: Family Medicine

## 2016-06-18 ENCOUNTER — Encounter: Payer: Self-pay | Admitting: Family Medicine

## 2016-06-18 ENCOUNTER — Other Ambulatory Visit: Payer: Self-pay | Admitting: Family Medicine

## 2016-06-18 ENCOUNTER — Other Ambulatory Visit (HOSPITAL_COMMUNITY)
Admission: RE | Admit: 2016-06-18 | Discharge: 2016-06-18 | Disposition: A | Discharge status: Home / Self Care | Payer: Medicare Other | Source: Ambulatory Visit | Attending: Family Medicine | Admitting: Family Medicine

## 2016-06-18 VITALS — BP 136/82 | HR 70 | Temp 97.3°F | Resp 18 | Wt 199.1 lb

## 2016-06-18 DIAGNOSIS — R609 Edema, unspecified: Secondary | ICD-10-CM

## 2016-06-18 DIAGNOSIS — L259 Unspecified contact dermatitis, unspecified cause: Secondary | ICD-10-CM | POA: Diagnosis not present

## 2016-06-18 DIAGNOSIS — Z23 Encounter for immunization: Secondary | ICD-10-CM

## 2016-06-18 DIAGNOSIS — L309 Dermatitis, unspecified: Secondary | ICD-10-CM | POA: Diagnosis not present

## 2016-06-18 MED ORDER — LOSARTAN POTASSIUM-HCTZ 100-25 MG PO TABS: 1.0000 | ORAL_TABLET | Freq: Every day | ORAL | 1 refills | Status: DC

## 2016-06-18 NOTE — Progress Notes (Signed)
OFFICE VISIT  06/20/2016   CC:  Chief Complaint  Patient presents with  . Follow-up    dermatitis   HPI:    Patient is a 81 y.o. Caucasian male who presents for 1 mo f/u generalized dermatitis and chronic LE venous stasis edema + stasis dermatitis.   Rash is improving, has some itching but he can ignore it.  Ankle swelling is minimal, as his his stasis dermatitis.  No fevers/chills/malaise.  He saw allergist for consideration of patch testing but his skin must be clear before this can be done.   Past Medical History:  Diagnosis Date  . Anemia 02/2012   Mild iron deficiency  . Arthritis   . Ataxia 2014/15   brain MRI 02/2014 showed encephalomalacia parietal and occip lobes c/w old infarcts, plus diffuse cerebral atrophy, o/w normal  . Cataract   . DDD (degenerative disc disease)    L-spine  . Dermatitis    ?Contact derm? per Konrad Saha MD: Dr. Threasa Alpha (05/09/16)  . GERD (gastroesophageal reflux disease)   . Heme positive stool 02/2012   colonoscopy normal.  EGD showed small hiatal hernia with associated Cameron's erosions.  Hemoccults NEG x 3 09/2014.  Marland Kitchen Hyperlipidemia   . Hypertension   . Macular degeneration, age related    exudative R eye; nonexudative L eye.  Vitreous degeneration bilat.  . Obesity (BMI 30-39.9)   . Retinal hemorrhage 2016   w/ retinal detachment (right eye) : anticoagulants stopped due to this  . Venous insufficiency of both lower extremities     Past Surgical History:  Procedure Laterality Date  . APPENDECTOMY  1935  . cataract surg     Bilateral/ 08-13  . COLONOSCOPY  05/12/12   for iron def anemia--NORMAL RESULT  . ESOPHAGOGASTRODUODENOSCOPY  06/17/12   Small hiatal hernia with associated Cameron's erosions--likely source of his mild iron def anemia.  Marland Kitchen NASAL SINUS SURGERY     1998  . TONSILLECTOMY  1935    Outpatient Medications Prior to Visit  Medication Sig Dispense Refill  . Aflibercept (EYLEA IO) Inject into the eye. One injection  into right eye every month    . Azelastine HCl (ASTEPRO) 0.15 % SOLN 2 sprays in each nostril at bedtime every night (Patient taking differently: 2 sprays in each nostril at bedtime every night as needed) 30 mL 11  . Cholecalciferol (VITAMIN D3) 2000 UNITS TABS Take by mouth daily.      . ferrous sulfate 325 (65 FE) MG tablet Take 325 mg by mouth daily with breakfast.    . furosemide (LASIX) 40 MG tablet 1/2-1 tab po qd 30 tablet 3  . losartan-hydrochlorothiazide (HYZAAR) 100-25 MG tablet Take 1 tablet by mouth daily. 90 tablet 1  . Multiple Vitamins-Minerals (PRESERVISION AREDS 2 PO) Take by mouth 2 (two) times daily.    . multivitamin (THERAGRAN) per tablet Take 1 tablet by mouth daily.      . mupirocin ointment (BACTROBAN) 2 % Apply 1 application topically 3 (three) times daily. 30 g 0  . omeprazole (PRILOSEC) 40 MG capsule Take 1 capsule (40 mg total) by mouth daily. 90 capsule 3  . potassium chloride SA (K-DUR,KLOR-CON) 20 MEQ tablet 2 tabs po once daily 60 tablet 3  . pravastatin (PRAVACHOL) 80 MG tablet TAKE 1 TABLET (80 MG TOTAL) BY MOUTH DAILY. 90 tablet 3  . tobramycin (TOBREX) 0.3 % ophthalmic solution Place 1 drop into the right eye. Four times daily begin 1 day prior to treatment  5  . verapamil (CALAN-SR) 240 MG CR tablet TAKE 1 TABLET (240 MG TOTAL) BY MOUTH 2 (TWO) TIMES DAILY. 180 tablet 3  . predniSONE (DELTASONE) 20 MG tablet 2 tabs po qd x 7d, then 1 tab po qd x 7d, then 1/2 tab po qd x 6d (Patient not taking: Reported on 06/18/2016) 24 tablet 0   No facility-administered medications prior to visit.     No Known Allergies  ROS As per HPI  PE: Blood pressure 136/82, pulse 70, temperature 97.3 F (36.3 C), temperature source Oral, resp. rate 18, weight 199 lb 1.9 oz (90.3 kg), SpO2 95 %. Gen: Alert, well appearing.  Patient is oriented to person, place, time, and situation. AFFECT: pleasant, lucid thought and speech. SKIN: he now has more discreet, irregular shaped  patches of rough/flaky skin that is pinkish colored.  These are scattered over LE's, esp backs of thighs, some on anterior trunk, a few on arms.  Back is clear. Ankles: flaky, mildly erythematous dermatitis diffusely over trace pitting edema bilat  LABS:  Lab Results  Component Value Date   TSH 1.85 03/10/2012   Lab Results  Component Value Date   WBC 11.5 (H) 04/26/2016   HGB 13.4 04/26/2016   HCT 40.2 04/26/2016   MCV 98.2 04/26/2016   PLT 383.0 04/26/2016   Lab Results  Component Value Date   CREATININE 0.95 05/17/2016   BUN 15 05/17/2016   NA 141 05/17/2016   K 4.2 05/17/2016   CL 100 05/17/2016   CO2 33 (H) 05/17/2016   Lab Results  Component Value Date   ALT 16 04/16/2016   AST 16 04/16/2016   ALKPHOS 94 04/16/2016   BILITOT 0.6 04/16/2016   Lab Results  Component Value Date   CHOL 204 (H) 10/18/2015   Lab Results  Component Value Date   HDL 58.70 10/18/2015   Lab Results  Component Value Date   LDLCALC 113 (H) 10/18/2015   Lab Results  Component Value Date   TRIG 162.0 (H) 10/18/2015   Lab Results  Component Value Date   CHOLHDL 3 10/18/2015   Lab Results  Component Value Date   PSA 1.45 10/08/2010   IMPRESSION AND PLAN:  Dermatitis, unclear etiology.  ?Contact derm, ? Atypical drug rxn. Awaiting patch testing when skin is clear. He'll continue application of steroid cream to the places that itch him the most.  No more systemic steroids at this time.  I can't see that these have done him any good. Punch biopsy done today with sterile technique: 3 mm, on one of the patches on L LE, lateral aspect. Pt tolerated procedure well, with no immediate complications.  One 4-0 ethilon suture placed to close skin defect.  An After Visit Summary was printed and given to the patient.  FOLLOW UP: Return in about 6 days (around 06/24/2016) for suture removal.  Signed:  Crissie Sickles, MD           06/20/2016

## 2016-06-18 NOTE — Progress Notes (Signed)
Pre visit review using our clinic review tool, if applicable. No additional management support is needed unless otherwise documented below in the visit note. 

## 2016-06-20 ENCOUNTER — Other Ambulatory Visit: Payer: Self-pay

## 2016-06-20 MED ORDER — OMEPRAZOLE 40 MG PO CPDR
40.0000 mg | DELAYED_RELEASE_CAPSULE | Freq: Every day | ORAL | 3 refills | Status: DC
Start: 2016-06-20 — End: 2017-08-21

## 2016-06-20 NOTE — Telephone Encounter (Signed)
Omeprazole refilled.

## 2016-06-23 ENCOUNTER — Encounter: Payer: Self-pay | Admitting: Family Medicine

## 2016-06-24 ENCOUNTER — Encounter: Payer: Self-pay | Admitting: Family Medicine

## 2016-06-24 ENCOUNTER — Ambulatory Visit (INDEPENDENT_AMBULATORY_CARE_PROVIDER_SITE_OTHER): Payer: Medicare Other | Admitting: Family Medicine

## 2016-06-24 VITALS — BP 133/73 | HR 87 | Temp 97.6°F | Resp 18 | Wt 200.0 lb

## 2016-06-24 DIAGNOSIS — I872 Venous insufficiency (chronic) (peripheral): Secondary | ICD-10-CM

## 2016-06-24 DIAGNOSIS — I8312 Varicose veins of left lower extremity with inflammation: Secondary | ICD-10-CM

## 2016-06-24 DIAGNOSIS — L309 Dermatitis, unspecified: Secondary | ICD-10-CM

## 2016-06-24 DIAGNOSIS — I8311 Varicose veins of right lower extremity with inflammation: Secondary | ICD-10-CM

## 2016-06-24 MED ORDER — PIMECROLIMUS 1 % EX CREA: TOPICAL_CREAM | Freq: Two times a day (BID) | CUTANEOUS | 6 refills | Status: DC

## 2016-06-24 MED ORDER — UREA 40 % EX FOAM: CUTANEOUS | 6 refills | Status: DC

## 2016-06-24 NOTE — Progress Notes (Signed)
Pre visit review using our clinic review tool, if applicable. No additional management support is needed unless otherwise documented below in the visit note. 

## 2016-06-24 NOTE — Progress Notes (Signed)
OFFICE NOTE  06/24/2016  CC:  Chief Complaint  Patient presents with  . Follow-up    edema     HPI: Patient is a 82 y.o. Caucasian male who is here for suture removal from left leg skin biopsy site.  I did this procedure 4d/a. Derm path showed chronic ezcematous/contact dermatitis, stains were neg for fungal organisms. Feeling well, rash continues to dry out and fade.   Pertinent PMH:  Past medical, surgical, social, and family history reviewed and no changes are noted since last office visit.  MEDS:  Outpatient Medications Prior to Visit  Medication Sig Dispense Refill  . Aflibercept (EYLEA IO) Inject into the eye. One injection into right eye every month    . amLODipine (NORVASC) 5 MG tablet     . augmented betamethasone dipropionate (DIPROLENE-AF) 0.05 % cream     . Azelastine HCl (ASTEPRO) 0.15 % SOLN 2 sprays in each nostril at bedtime every night (Patient taking differently: 2 sprays in each nostril at bedtime every night as needed) 30 mL 11  . cetirizine (ZYRTEC) 10 MG tablet Take 10 mg by mouth daily.  5  . Cholecalciferol (VITAMIN D3) 2000 UNITS TABS Take by mouth daily.      . ferrous sulfate 325 (65 FE) MG tablet Take 325 mg by mouth daily with breakfast.    . furosemide (LASIX) 40 MG tablet 1/2-1 tab po qd 30 tablet 3  . losartan-hydrochlorothiazide (HYZAAR) 100-25 MG tablet Take 1 tablet by mouth daily. 90 tablet 1  . minocycline (MINOCIN,DYNACIN) 100 MG capsule     . Multiple Vitamins-Minerals (PRESERVISION AREDS 2 PO) Take by mouth 2 (two) times daily.    . multivitamin (THERAGRAN) per tablet Take 1 tablet by mouth daily.      . mupirocin ointment (BACTROBAN) 2 % Apply 1 application topically 3 (three) times daily. 30 g 0  . omeprazole (PRILOSEC) 40 MG capsule Take 1 capsule (40 mg total) by mouth daily. 90 capsule 3  . potassium chloride SA (K-DUR,KLOR-CON) 20 MEQ tablet 2 tabs po once daily 60 tablet 3  . pravastatin (PRAVACHOL) 80 MG tablet TAKE 1 TABLET (80 MG  TOTAL) BY MOUTH DAILY. 90 tablet 3  . tobramycin (TOBREX) 0.3 % ophthalmic solution Place 1 drop into the right eye. Four times daily begin 1 day prior to treatment  5  . verapamil (CALAN-SR) 240 MG CR tablet TAKE 1 TABLET (240 MG TOTAL) BY MOUTH 2 (TWO) TIMES DAILY. 180 tablet 3   No facility-administered medications prior to visit.     PE: Blood pressure 133/73, pulse 87, temperature 97.6 F (36.4 C), temperature source Oral, resp. rate 18, weight 200 lb (90.7 kg), SpO2 96 %. Gen: Alert, well appearing.  Patient is oriented to person, place, time, and situation. AFFECT: pleasant, lucid thought and speech. Left lower leg with one suture in place.  No erythema or warmth or swelling. Suture removed w/out problem today.  No bleeding.  Pt tolerated this well. Ankles: deep pinkish hue diffusely with 1+ pitting bilat, scaly hyperkeratosis in ankles. Remainder of skin: light pink patches of superficially flaky dermatitis.  No vesicles, pustules, or erythema.  IMPRESSION AND PLAN:  1) Scattered patchy dermatitis: suture removal today from skin punch bx site:  Path showed chronic eczematous dermatitis vs contact dermatitis. Plan to add elidel trial today. F/u with allergist for patch testing when skin clears up more.  2) LE venous stasis edema and dermatitis. Rx urea foam to apply to ankles.  An  After Visit Summary was printed and given to the patient.  FOLLOW UP: 2 mo, routine f/u chronic illness   Signed:  Crissie Sickles, MD           06/24/2016

## 2016-06-25 ENCOUNTER — Encounter: Payer: Self-pay | Admitting: Family Medicine

## 2016-06-26 ENCOUNTER — Other Ambulatory Visit: Payer: Self-pay

## 2016-06-26 ENCOUNTER — Telehealth: Payer: Self-pay | Admitting: Family Medicine

## 2016-06-26 NOTE — Telephone Encounter (Signed)
Hydro 40 foam was changed to a cream and called in to pharmacy.

## 2016-06-26 NOTE — Telephone Encounter (Signed)
Pls notify pt that his insurer declined to cover the new cream I rx'd called elidel. Nothing we can do about it.  Continue to use the topical steroid cream he already has been putting on the itchy spots of his skin (diprolene, also known as augmented betamethasone dipropionate 0.05% cream).-thx

## 2016-06-27 NOTE — Telephone Encounter (Signed)
Left message for patient to return call.

## 2016-06-28 NOTE — Telephone Encounter (Signed)
Patient came in needing to speak with someone stating that they had called him and he doesn't know why. Please give the patient a call at [phone].

## 2016-07-01 NOTE — Telephone Encounter (Signed)
Patient notified of instructions. Verbalized understanding.

## 2016-07-15 DIAGNOSIS — H43813 Vitreous degeneration, bilateral: Secondary | ICD-10-CM | POA: Diagnosis not present

## 2016-07-15 DIAGNOSIS — H35412 Lattice degeneration of retina, left eye: Secondary | ICD-10-CM | POA: Diagnosis not present

## 2016-07-15 DIAGNOSIS — H353211 Exudative age-related macular degeneration, right eye, with active choroidal neovascularization: Secondary | ICD-10-CM | POA: Diagnosis not present

## 2016-07-15 DIAGNOSIS — H353122 Nonexudative age-related macular degeneration, left eye, intermediate dry stage: Secondary | ICD-10-CM | POA: Diagnosis not present

## 2016-07-23 DIAGNOSIS — Z79899 Other long term (current) drug therapy: Secondary | ICD-10-CM | POA: Diagnosis not present

## 2016-07-23 DIAGNOSIS — L249 Irritant contact dermatitis, unspecified cause: Secondary | ICD-10-CM | POA: Diagnosis not present

## 2016-08-26 ENCOUNTER — Encounter: Payer: Self-pay | Admitting: Family Medicine

## 2016-08-26 ENCOUNTER — Ambulatory Visit (INDEPENDENT_AMBULATORY_CARE_PROVIDER_SITE_OTHER): Payer: Medicare Other | Admitting: Family Medicine

## 2016-08-26 VITALS — BP 113/65 | HR 61 | Temp 97.6°F | Resp 16 | Ht 69.0 in | Wt 194.8 lb

## 2016-08-26 DIAGNOSIS — R634 Abnormal weight loss: Secondary | ICD-10-CM | POA: Diagnosis not present

## 2016-08-26 DIAGNOSIS — R6881 Early satiety: Secondary | ICD-10-CM | POA: Diagnosis not present

## 2016-08-26 DIAGNOSIS — I872 Venous insufficiency (chronic) (peripheral): Secondary | ICD-10-CM

## 2016-08-26 DIAGNOSIS — E7800 Pure hypercholesterolemia, unspecified: Secondary | ICD-10-CM

## 2016-08-26 DIAGNOSIS — Z862 Personal history of diseases of the blood and blood-forming organs and certain disorders involving the immune mechanism: Secondary | ICD-10-CM | POA: Diagnosis not present

## 2016-08-26 DIAGNOSIS — I10 Essential (primary) hypertension: Secondary | ICD-10-CM

## 2016-08-26 DIAGNOSIS — Z79899 Other long term (current) drug therapy: Secondary | ICD-10-CM | POA: Diagnosis not present

## 2016-08-26 LAB — COMPREHENSIVE METABOLIC PANEL
ALBUMIN: 4.3 g/dL (ref 3.5–5.2)
ALK PHOS: 91 U/L (ref 39–117)
ALT: 19 U/L (ref 0–53)
AST: 19 U/L (ref 0–37)
BILIRUBIN TOTAL: 0.7 mg/dL (ref 0.2–1.2)
BUN: 22 mg/dL (ref 6–23)
CALCIUM: 9.7 mg/dL (ref 8.4–10.5)
CHLORIDE: 98 meq/L (ref 96–112)
CO2: 31 mEq/L (ref 19–32)
CREATININE: 0.99 mg/dL (ref 0.40–1.50)
GFR: 75.99 mL/min (ref >60.00)
Glucose, Bld: 96 mg/dL (ref 70–99)
Potassium: 4.2 mEq/L (ref 3.5–5.1)
Sodium: 139 mEq/L (ref 135–145)
TOTAL PROTEIN: 7.2 g/dL (ref 6.0–8.3)

## 2016-08-26 LAB — IRON: Iron: 120 ug/dL (ref 42–165)

## 2016-08-26 LAB — CBC WITH DIFFERENTIAL/PLATELET
Basophils Absolute: 0 10*3/uL (ref 0.0–0.1)
Basophils Relative: 0.5 % (ref 0.0–3.0)
EOS ABS: 1 10*3/uL — AB (ref 0.0–0.7)
Eosinophils Relative: 10.9 % — ABNORMAL HIGH (ref 0.0–5.0)
HCT: 42.2 % (ref 39.0–52.0)
HEMOGLOBIN: 14.1 g/dL (ref 13.0–17.0)
Lymphocytes Relative: 11 % — ABNORMAL LOW (ref 12.0–46.0)
Lymphs Abs: 1.1 10*3/uL (ref 0.7–4.0)
MCHC: 33.4 g/dL (ref 30.0–36.0)
MCV: 99.4 fl (ref 78.0–100.0)
MONO ABS: 0.8 10*3/uL (ref 0.1–1.0)
Monocytes Relative: 8.4 % (ref 3.0–12.0)
NEUTROS PCT: 69.2 % (ref 43.0–77.0)
Neutro Abs: 6.7 10*3/uL (ref 1.4–7.7)
Platelets: 458 10*3/uL — ABNORMAL HIGH (ref 150.0–400.0)
RBC: 4.25 Mil/uL (ref 4.22–5.81)
RDW: 15.6 % — AB (ref 11.5–15.5)
WBC: 9.6 10*3/uL (ref 4.0–10.5)

## 2016-08-26 LAB — SEDIMENTATION RATE: SED RATE: 16 mm/h (ref 0–20)

## 2016-08-26 LAB — C-REACTIVE PROTEIN: CRP: 0.5 mg/dL (ref 0.5–20.0)

## 2016-08-26 LAB — LIPID PANEL
CHOLESTEROL: 214 mg/dL — AB (ref 0–200)
HDL: 68.4 mg/dL (ref >39.00)
LDL CALC: 123 mg/dL — AB (ref 0–99)
NonHDL: 146.03
TRIGLYCERIDES: 115 mg/dL (ref 0.0–149.0)
Total CHOL/HDL Ratio: 3
VLDL: 23 mg/dL (ref 0.0–40.0)

## 2016-08-26 LAB — IRON AND TIBC
%SAT: 35 % (ref 15–60)
Iron: 103 ug/dL (ref 50–180)
TIBC: 296 ug/dL (ref 250–425)
UIBC: 193 ug/dL (ref 125–400)

## 2016-08-26 LAB — TSH: TSH: 2.54 u[IU]/mL (ref 0.35–4.50)

## 2016-08-26 LAB — FERRITIN: Ferritin: 121.6 ng/mL (ref 22.0–322.0)

## 2016-08-26 MED ORDER — FUROSEMIDE 40 MG PO TABS: ORAL_TABLET | ORAL | 3 refills | Status: DC

## 2016-08-26 NOTE — Progress Notes (Signed)
OFFICE VISIT  08/26/2016   CC:  Chief Complaint  Patient presents with  . Follow-up    Pt is fasting.     HPI:    Patient is a 82 y.o. Caucasian male who presents for f/u HTN, HLD, chronic LE venous insufficiency edema. Ran out of potassium 5 d/a, doesn't want to get back on it. Not monitoring bp at home. He is compliant with his statin and denies side effects.  Says he feels hungry, describes early satiety for the last 3-4 months.  No abd pain when he eats.  No change in energy level: "I'm ok".   He eats a salad once a day.  Otherwise intake is about 1/2 of his usual. He takes 1/2 of a lasix 41m tab once daily. He takes an iron pill once most days, says his stools are black.  ROS: no fevers, no chest pain, no signif arthralgias or myalgias, no HAs, no cough. Has chronic dermatitis still.  Past Medical History:  Diagnosis Date  . Anemia 02/2012   Mild iron deficiency  . Arthritis   . Ataxia 2014/15   brain MRI 02/2014 showed encephalomalacia parietal and occip lobes c/w old infarcts, plus diffuse cerebral atrophy, o/w normal  . Cataract   . DDD (degenerative disc disease)    L-spine  . Dermatitis    ?Contact derm? per [facility] MD: Dr. SThreasa Alpha(05/09/16): punch bx 06/18/16: chronic eczematous derm/contact derm, neg fungal stain.  Allergy (RAST) testing NEG.  . GERD (gastroesophageal reflux disease)   . Heme positive stool 02/2012   colonoscopy normal.  EGD showed small hiatal hernia with associated Cameron's erosions.  Hemoccults NEG x 3 09/2014.  .Marland KitchenHyperlipidemia   . Hypertension   . Macular degeneration, age related    exudative R eye; nonexudative L eye.  Vitreous degeneration bilat.  . Obesity (BMI 30-39.9)   . Retinal hemorrhage 2016   w/ retinal detachment (right eye) : anticoagulants stopped due to this  . Venous insufficiency of both lower extremities     Past Surgical History:  Procedure Laterality Date  . APPENDECTOMY  1935  . cataract surg     Bilateral/ 08-13  . COLONOSCOPY  05/12/12   for iron def anemia--NORMAL RESULT  . ESOPHAGOGASTRODUODENOSCOPY  06/17/12   Small hiatal hernia with associated Cameron's erosions--likely source of his mild iron def anemia.  .Marland KitchenNASAL SINUS SURGERY     1998  . TONSILLECTOMY  1935    Outpatient Medications Prior to Visit  Medication Sig Dispense Refill  . Aflibercept (EYLEA IO) Inject into the eye. One injection into right eye every month    . amLODipine (NORVASC) 5 MG tablet     . cetirizine (ZYRTEC) 10 MG tablet Take 10 mg by mouth daily.  5  . Cholecalciferol (VITAMIN D3) 2000 UNITS TABS Take by mouth daily.      . ferrous sulfate 325 (65 FE) MG tablet Take 325 mg by mouth daily with breakfast.    . losartan-hydrochlorothiazide (HYZAAR) 100-25 MG tablet Take 1 tablet by mouth daily. 90 tablet 1  . Multiple Vitamins-Minerals (PRESERVISION AREDS 2 PO) Take by mouth 2 (two) times daily.    . multivitamin (THERAGRAN) per tablet Take 1 tablet by mouth daily.      .Marland Kitchenomeprazole (PRILOSEC) 40 MG capsule Take 1 capsule (40 mg total) by mouth daily. 90 capsule 3  . pimecrolimus (ELIDEL) 1 % cream Apply topically 2 (two) times daily. 60 g 6  . pravastatin (PRAVACHOL)  80 MG tablet TAKE 1 TABLET (80 MG TOTAL) BY MOUTH DAILY. 90 tablet 3  . tobramycin (TOBREX) 0.3 % ophthalmic solution Place 1 drop into the right eye. Four times daily begin 1 day prior to treatment  5  . Urea 40 % FOAM Apply to ankles rash daily 150 g 6  . verapamil (CALAN-SR) 240 MG CR tablet TAKE 1 TABLET (240 MG TOTAL) BY MOUTH 2 (TWO) TIMES DAILY. 180 tablet 3  . furosemide (LASIX) 40 MG tablet 1/2-1 tab po qd 30 tablet 3  . augmented betamethasone dipropionate (DIPROLENE-AF) 0.05 % cream     . Azelastine HCl (ASTEPRO) 0.15 % SOLN 2 sprays in each nostril at bedtime every night (Patient not taking: Reported on 08/26/2016) 30 mL 11  . minocycline (MINOCIN,DYNACIN) 100 MG capsule     . mupirocin ointment (BACTROBAN) 2 % Apply 1  application topically 3 (three) times daily. (Patient not taking: Reported on 08/26/2016) 30 g 0  . potassium chloride SA (K-DUR,KLOR-CON) 20 MEQ tablet 2 tabs po once daily (Patient not taking: Reported on 08/26/2016) 60 tablet 3   No facility-administered medications prior to visit.     No Known Allergies  ROS As per HPI  PE: Blood pressure 113/65, pulse 61, temperature 97.6 F (36.4 C), temperature source Oral, resp. rate 16, height 5' 9" (1.753 m), weight 194 lb 12 oz (88.3 kg), SpO2 96 %. Gen: Alert, well appearing.  Patient is oriented to person, place, time, and situation. AFFECT: pleasant, lucid thought and speech. HQP:RFFM: no injection, icteris, swelling, or exudate.  EOMI, PERRLA. Mouth: lips without lesion/swelling.  Oral mucosa pink and moist. Oropharynx without erythema, exudate, or swelling.  CV: RRR, occ ectopic beat, w/out m/r/g Chest is clear, no wheezing or rales. Normal symmetric air entry throughout both lung fields. No chest wall deformities or tenderness. ABD: soft, NT/ND EXT: no c/c/e.  He has hyperkeratotic and fibrotic skin changes in both pretibial/ankle regions. SKIN: Trunk > extremities with scattered pinkish patches of papules with superficial desquamation.  LABS:  Lab Results  Component Value Date   TSH 1.85 03/10/2012   Lab Results  Component Value Date   WBC 11.5 (H) 04/26/2016   HGB 13.4 04/26/2016   HCT 40.2 04/26/2016   MCV 98.2 04/26/2016   PLT 383.0 04/26/2016   Lab Results  Component Value Date   CREATININE 0.95 05/17/2016   BUN 15 05/17/2016   NA 141 05/17/2016   K 4.2 05/17/2016   CL 100 05/17/2016   CO2 33 (H) 05/17/2016   Lab Results  Component Value Date   ALT 16 04/16/2016   AST 16 04/16/2016   ALKPHOS 94 04/16/2016   BILITOT 0.6 04/16/2016   Lab Results  Component Value Date   CHOL 204 (H) 10/18/2015   Lab Results  Component Value Date   HDL 58.70 10/18/2015   Lab Results  Component Value Date   LDLCALC 113  (H) 10/18/2015   Lab Results  Component Value Date   TRIG 162.0 (H) 10/18/2015   Lab Results  Component Value Date   CHOLHDL 3 10/18/2015   Lab Results  Component Value Date   PSA 1.45 10/08/2010   Lab Results  Component Value Date   ESRSEDRATE 70 (H) 04/16/2016   Lab Results  Component Value Date   IRON 88 03/11/2013   FERRITIN 39.4 03/15/2015    IMPRESSION AND PLAN:  1) HTN; The current medical regimen is effective;  continue present plan and medications. OK to stay  off potassium at this time but we'll check BMET today. If K low/borderline low then we'll need to change his losartan-hctz to plain losartan.  2) LE venous insufficiency edema: very well controlled.  Will have him decrease his lasix to 1/2 of 81m tab EVERY OTHER day.  3) Hyperlipidemia: tolerating statin.  Recheck FLP today--he is fasting.  4) Dermatitis: no change.  Currently doing no med for this and he is "living with it".  Bx of the rash showed changes c/w chronic dermatitis/contact dermatitis.  5) Abnormal wt loss w/early satiety: some of his wt loss can be attributed to fluid loss from use of lasix. However, will do some labs today (TSH, BMET, ESR, CRP, Ferritin, iron, TIBC) and he may eventually need referral to GI.  An After Visit Summary was printed and given to the patient.  FOLLOW UP: Return in about 4 weeks (around 09/23/2016) for f/u early satiety and abnl wt loss.  Signed:  PCrissie Sickles MD           08/26/2016

## 2016-08-26 NOTE — Progress Notes (Signed)
Pre visit review using our clinic review tool, if applicable. No additional management support is needed unless otherwise documented below in the visit note. 

## 2016-08-29 DIAGNOSIS — L249 Irritant contact dermatitis, unspecified cause: Secondary | ICD-10-CM | POA: Diagnosis not present

## 2016-09-05 ENCOUNTER — Other Ambulatory Visit: Payer: Self-pay | Admitting: *Deleted

## 2016-09-05 MED ORDER — AMLODIPINE BESYLATE 5 MG PO TABS: 5.0000 mg | ORAL_TABLET | Freq: Every day | ORAL | 11 refills | Status: DC

## 2016-09-05 NOTE — Telephone Encounter (Signed)
Fax from [facility] OR requesting refill for amlodipine  LOV: 08/26/16 NOV: 09/25/16 Last written: 10/11/15 #30 w/ 11RF

## 2016-09-18 ENCOUNTER — Other Ambulatory Visit: Payer: Self-pay | Admitting: Family Medicine

## 2016-09-18 NOTE — Telephone Encounter (Signed)
Pharmacy did not receive Rx sent on 08/26/16. Rx resent.

## 2016-09-25 ENCOUNTER — Ambulatory Visit (INDEPENDENT_AMBULATORY_CARE_PROVIDER_SITE_OTHER): Payer: Medicare Other | Admitting: Family Medicine

## 2016-09-25 ENCOUNTER — Encounter: Payer: Self-pay | Admitting: Family Medicine

## 2016-09-25 VITALS — BP 117/69 | HR 68 | Temp 97.4°F | Resp 16 | Ht 69.0 in | Wt 196.8 lb

## 2016-09-25 DIAGNOSIS — R6881 Early satiety: Secondary | ICD-10-CM

## 2016-09-25 DIAGNOSIS — R634 Abnormal weight loss: Secondary | ICD-10-CM

## 2016-09-25 NOTE — Progress Notes (Signed)
Pre visit review using our clinic review tool, if applicable. No additional management support is needed unless otherwise documented below in the visit note. 

## 2016-09-25 NOTE — Progress Notes (Signed)
OFFICE VISIT  09/25/2016   CC:  Chief Complaint  Patient presents with  . Follow-up    satiety and abnormal weight loss   HPI:    Patient is a 82 y.o. Caucasian male who presents for 4 week f/u early satiety/undesired wt loss. Says he is eating better now, has gained a few pounds since last visit.   He has a good appetite--always has.  Now is not having any problem with early satiety like he had been in the recent past.  No abd pain or nausea with eating.   His chronic dermatitis is unchanged.  LE edema is stable on current diuretic regimen. He has some chronic, episodic LBP that he takes RARE aleve for (sometimes 1-2 tabs per week, sometimes goes 1 mo between doses).  No cough, no hemoptysis, no SOB, no CP, no feeling of increased abdominal girth.  Wts: from 10/2015 to 03/2016 his wt dropped from 219 lbs down to 204 lbs.  It then stayed pretty steady between 200 and 210 lbs up until 07/2016 when it dropped to a low of 195.  Today it is 197.   Past Medical History:  Diagnosis Date  . Anemia 02/2012   Mild iron deficiency  . Arthritis   . Ataxia 2014/15   brain MRI 02/2014 showed encephalomalacia parietal and occip lobes c/w old infarcts, plus diffuse cerebral atrophy, o/w normal  . Cataract   . DDD (degenerative disc disease)    L-spine  . Dermatitis    ?Contact derm? per [facility] MD: Dr. Threasa Alpha (05/09/16): punch bx 06/18/16: chronic eczematous derm/contact derm, neg fungal stain.  Allergy (RAST) testing NEG.  . GERD (gastroesophageal reflux disease)   . Heme positive stool 02/2012   colonoscopy normal.  EGD showed small hiatal hernia with associated Cameron's erosions.  Hemoccults NEG x 3 09/2014.  Marland Kitchen Hyperlipidemia   . Hypertension   . Macular degeneration, age related    exudative R eye; nonexudative L eye.  Vitreous degeneration bilat.  . Obesity (BMI 30-39.9)   . Retinal hemorrhage 2016   w/ retinal detachment (right eye) : anticoagulants stopped due to this  . Venous  insufficiency of both lower extremities     Past Surgical History:  Procedure Laterality Date  . APPENDECTOMY  1935  . cataract surg     Bilateral/ 08-13  . COLONOSCOPY  05/12/12   for iron def anemia--NORMAL RESULT  . ESOPHAGOGASTRODUODENOSCOPY  06/17/12   Small hiatal hernia with associated Cameron's erosions--likely source of his mild iron def anemia.  Marland Kitchen NASAL SINUS SURGERY     1998  . TONSILLECTOMY  1935    Outpatient Medications Prior to Visit  Medication Sig Dispense Refill  . Aflibercept (EYLEA IO) Inject into the eye. One injection into right eye every month    . amLODipine (NORVASC) 5 MG tablet Take 1 tablet (5 mg total) by mouth daily. 30 tablet 11  . cetirizine (ZYRTEC) 10 MG tablet Take 10 mg by mouth daily.  5  . Cholecalciferol (VITAMIN D3) 2000 UNITS TABS Take by mouth daily.      . furosemide (LASIX) 40 MG tablet Take 0.5 to 1 tablet by mouth every other day 30 tablet 3  . losartan-hydrochlorothiazide (HYZAAR) 100-25 MG tablet Take 1 tablet by mouth daily. 90 tablet 1  . Multiple Vitamins-Minerals (PRESERVISION AREDS 2 PO) Take by mouth 2 (two) times daily.    . multivitamin (THERAGRAN) per tablet Take 1 tablet by mouth daily.      Marland Kitchen  omeprazole (PRILOSEC) 40 MG capsule Take 1 capsule (40 mg total) by mouth daily. 90 capsule 3  . pimecrolimus (ELIDEL) 1 % cream Apply topically 2 (two) times daily. 60 g 6  . pravastatin (PRAVACHOL) 80 MG tablet TAKE 1 TABLET (80 MG TOTAL) BY MOUTH DAILY. 90 tablet 3  . tobramycin (TOBREX) 0.3 % ophthalmic solution Place 1 drop into the right eye. Four times daily begin 1 day prior to treatment  5  . Urea 40 % FOAM Apply to ankles rash daily 150 g 6  . verapamil (CALAN-SR) 240 MG CR tablet TAKE 1 TABLET (240 MG TOTAL) BY MOUTH 2 (TWO) TIMES DAILY. 180 tablet 3  . ferrous sulfate 325 (65 FE) MG tablet Take 325 mg by mouth daily with breakfast.    . furosemide (LASIX) 40 MG tablet 1/2-1 tab po qod (Patient not taking: Reported on  09/25/2016) 30 tablet 3   No facility-administered medications prior to visit.     No Known Allergies  ROS As per HPI  PE: Blood pressure 117/69, pulse 68, temperature 97.4 F (36.3 C), temperature source Oral, resp. rate 16, height 5\' 9"  (1.753 m), weight 196 lb 12 oz (89.2 kg), SpO2 97 %. Gen: Alert, well appearing.  Patient is oriented to person, place, time, and situation. AFFECT: pleasant, lucid thought and speech. [id number]: no injection, icteris, swelling, or exudate.  EOMI, PERRLA. Mouth: lips without lesion/swelling.  Oral mucosa pink and moist. Oropharynx without erythema, exudate, or swelling.  Neck - No masses or thyromegaly or limitation in range of motion CV: RRR with occ ectopic beat.  No m/r/g. Chest is clear, no wheezing or rales. Normal symmetric air entry throughout both lung fields. No chest wall deformities or tenderness. ABD: soft, NT, ND, BS normal.  No hepatospenomegaly or mass.  No bruits. EXT: 2+ pitting edema in both LL's, mainly in ankles.  Flaky/hyperkeratotic rash on ankles and pretibial surfaces.  This rash coalesces with a mildly, more superficial flaky pinkish dermatitis on upper portion of lower legs.  This rash is also noted in much milder form on trunk and arms.  LABS:  Lab Results  Component Value Date   WBC 9.6 08/26/2016   HGB 14.1 08/26/2016   HCT 42.2 08/26/2016   MCV 99.4 08/26/2016   PLT 458.0 (H) 08/26/2016   Lab Results  Component Value Date   IRON 120 08/26/2016   IRON 103 08/26/2016   TIBC 296 08/26/2016   FERRITIN 121.6 08/26/2016     Chemistry      Component Value Date/Time   NA 139 08/26/2016 1131   K 4.2 08/26/2016 1131   CL 98 08/26/2016 1131   CO2 31 08/26/2016 1131   BUN 22 08/26/2016 1131   CREATININE 0.99 08/26/2016 1131      Component Value Date/Time   CALCIUM 9.7 08/26/2016 1131   ALKPHOS 91 08/26/2016 1131   AST 19 08/26/2016 1131   ALT 19 08/26/2016 1131   BILITOT 0.7 08/26/2016 1131     Lab Results   Component Value Date   TSH 2.54 08/26/2016   IMPRESSION AND PLAN:  1) Early satiety and abnormal weight loss. Unclear etiology, but his early satiety has resolved and his wt has started to come up. Lab testing last month was reassuring.  We have not done any imaging for this problem. His desire is to take a watchful waiting approach at this time. He can stop his iron tablet now.  An After Visit Summary was printed and given  to the patient.  Spent 25 min with pt today, with >50% of this time spent in counseling and care coordination regarding the above problems.  FOLLOW UP: Return in about 4 months (around 01/24/2017) for routine chronic illness f/u.  Signed:  Crissie Sickles, MD           09/25/2016

## 2016-10-04 DIAGNOSIS — H353122 Nonexudative age-related macular degeneration, left eye, intermediate dry stage: Secondary | ICD-10-CM | POA: Diagnosis not present

## 2016-10-04 DIAGNOSIS — H35412 Lattice degeneration of retina, left eye: Secondary | ICD-10-CM | POA: Diagnosis not present

## 2016-10-04 DIAGNOSIS — H353211 Exudative age-related macular degeneration, right eye, with active choroidal neovascularization: Secondary | ICD-10-CM | POA: Diagnosis not present

## 2016-10-04 DIAGNOSIS — H43813 Vitreous degeneration, bilateral: Secondary | ICD-10-CM | POA: Diagnosis not present

## 2016-10-29 DIAGNOSIS — L249 Irritant contact dermatitis, unspecified cause: Secondary | ICD-10-CM | POA: Diagnosis not present

## 2016-12-20 DIAGNOSIS — H52223 Regular astigmatism, bilateral: Secondary | ICD-10-CM | POA: Diagnosis not present

## 2016-12-20 DIAGNOSIS — L718 Other rosacea: Secondary | ICD-10-CM | POA: Diagnosis not present

## 2016-12-20 DIAGNOSIS — H16223 Keratoconjunctivitis sicca, not specified as Sjogren's, bilateral: Secondary | ICD-10-CM | POA: Diagnosis not present

## 2016-12-20 DIAGNOSIS — H524 Presbyopia: Secondary | ICD-10-CM | POA: Diagnosis not present

## 2016-12-20 DIAGNOSIS — H353230 Exudative age-related macular degeneration, bilateral, stage unspecified: Secondary | ICD-10-CM | POA: Diagnosis not present

## 2016-12-20 DIAGNOSIS — Z961 Presence of intraocular lens: Secondary | ICD-10-CM | POA: Diagnosis not present

## 2016-12-20 DIAGNOSIS — H5212 Myopia, left eye: Secondary | ICD-10-CM | POA: Diagnosis not present

## 2016-12-24 DIAGNOSIS — L249 Irritant contact dermatitis, unspecified cause: Secondary | ICD-10-CM | POA: Diagnosis not present

## 2016-12-25 DIAGNOSIS — H43813 Vitreous degeneration, bilateral: Secondary | ICD-10-CM | POA: Diagnosis not present

## 2016-12-25 DIAGNOSIS — H35412 Lattice degeneration of retina, left eye: Secondary | ICD-10-CM | POA: Diagnosis not present

## 2016-12-25 DIAGNOSIS — H353211 Exudative age-related macular degeneration, right eye, with active choroidal neovascularization: Secondary | ICD-10-CM | POA: Diagnosis not present

## 2016-12-25 DIAGNOSIS — H353121 Nonexudative age-related macular degeneration, left eye, early dry stage: Secondary | ICD-10-CM | POA: Diagnosis not present

## 2016-12-26 ENCOUNTER — Other Ambulatory Visit: Payer: Self-pay | Admitting: Family Medicine

## 2016-12-29 DIAGNOSIS — N182 Chronic kidney disease, stage 2 (mild): Secondary | ICD-10-CM

## 2016-12-29 HISTORY — DX: Chronic kidney disease, stage 2 (mild): N18.2

## 2017-01-07 ENCOUNTER — Ambulatory Visit (INDEPENDENT_AMBULATORY_CARE_PROVIDER_SITE_OTHER): Payer: Medicare Other | Admitting: Family Medicine

## 2017-01-07 ENCOUNTER — Encounter: Payer: Self-pay | Admitting: Family Medicine

## 2017-01-07 VITALS — BP 104/63 | HR 83 | Temp 98.2°F | Resp 16 | Ht 69.0 in | Wt 201.2 lb

## 2017-01-07 DIAGNOSIS — R634 Abnormal weight loss: Secondary | ICD-10-CM | POA: Diagnosis not present

## 2017-01-07 DIAGNOSIS — R630 Anorexia: Secondary | ICD-10-CM | POA: Diagnosis not present

## 2017-01-07 DIAGNOSIS — Z0000 Encounter for general adult medical examination without abnormal findings: Secondary | ICD-10-CM | POA: Diagnosis not present

## 2017-01-07 DIAGNOSIS — J310 Chronic rhinitis: Secondary | ICD-10-CM

## 2017-01-07 DIAGNOSIS — E7800 Pure hypercholesterolemia, unspecified: Secondary | ICD-10-CM

## 2017-01-07 DIAGNOSIS — I872 Venous insufficiency (chronic) (peripheral): Secondary | ICD-10-CM | POA: Diagnosis not present

## 2017-01-07 DIAGNOSIS — I10 Essential (primary) hypertension: Secondary | ICD-10-CM

## 2017-01-07 LAB — BASIC METABOLIC PANEL
BUN: 29 mg/dL — ABNORMAL HIGH (ref 6–23)
CO2: 30 mEq/L (ref 19–32)
Calcium: 9.4 mg/dL (ref 8.4–10.5)
Chloride: 103 mEq/L (ref 96–112)
Creatinine, Ser: 1.12 mg/dL (ref 0.40–1.50)
GFR: 65.85 mL/min (ref >60.00)
Glucose, Bld: 124 mg/dL — ABNORMAL HIGH (ref 70–99)
Potassium: 3.9 mEq/L (ref 3.5–5.1)
Sodium: 140 mEq/L (ref 135–145)

## 2017-01-07 MED ORDER — IPRATROPIUM BROMIDE 0.03 % NA SOLN: NASAL | 12 refills | Status: DC

## 2017-01-07 NOTE — Progress Notes (Signed)
Pre visit review using our clinic review tool, if applicable. No additional management support is needed unless otherwise documented below in the visit note. 

## 2017-01-07 NOTE — Patient Instructions (Addendum)
Bring a copy of your advance directives to your next office visit.  Continue doing brain stimulating activities (puzzles, reading, adult coloring books, staying active) to keep memory sharp.      Health Maintenance, Male A healthy lifestyle and preventive care is important for your health and wellness. Ask your health care provider about what schedule of regular examinations is right for you. What should I know about weight and diet?  Eat a Healthy Diet  Eat plenty of vegetables, fruits, whole grains, low-fat dairy products, and lean protein.  Do not eat a lot of foods high in solid fats, added sugars, or salt. Maintain a Healthy Weight  Regular exercise can help you achieve or maintain a healthy weight. You should:  Do at least 150 minutes of exercise each week. The exercise should increase your heart rate and make you sweat (moderate-intensity exercise).  Do strength-training exercises at least twice a week. Watch Your Levels of Cholesterol and Blood Lipids  Have your blood tested for lipids and cholesterol every 5 years starting at 81 years of age. If you are at high risk for heart disease, you should start having your blood tested when you are 81 years old. You may need to have your cholesterol levels checked more often if:  Your lipid or cholesterol levels are high.  You are older than 81 years of age.  You are at high risk for heart disease. What should I know about cancer screening? Many types of cancers can be detected early and may often be prevented. Lung Cancer  You should be screened every year for lung cancer if:  You are a current smoker who has smoked for at least 30 years.  You are a former smoker who has quit within the past 15 years.  Talk to your health care provider about your screening options, when you should start screening, and how often you should be screened. Colorectal Cancer  Routine colorectal cancer screening usually begins at 81 years of age  and should be repeated every 5-10 years until you are 81 years old. You may need to be screened more often if early forms of precancerous polyps or small growths are found. Your health care provider may recommend screening at an earlier age if you have risk factors for colon cancer.  Your health care provider may recommend using home test kits to check for hidden blood in the stool.  A small camera at the end of a tube can be used to examine your colon (sigmoidoscopy or colonoscopy). This checks for the earliest forms of colorectal cancer. Prostate and Testicular Cancer  Depending on your age and overall health, your health care provider may do certain tests to screen for prostate and testicular cancer.  Talk to your health care provider about any symptoms or concerns you have about testicular or prostate cancer. Skin Cancer  Check your skin from head to toe regularly.  Tell your health care provider about any new moles or changes in moles, especially if:  There is a change in a mole's size, shape, or color.  You have a mole that is larger than a pencil eraser.  Always use sunscreen. Apply sunscreen liberally and repeat throughout the day.  Protect yourself by wearing long sleeves, pants, a wide-brimmed hat, and sunglasses when outside. What should I know about heart disease, diabetes, and high blood pressure?  If you are 18-39 years of age, have your blood pressure checked every 3-5 years. If you are 40 years of   age or older, have your blood pressure checked every year. You should have your blood pressure measured twice-once when you are at a hospital or clinic, and once when you are not at a hospital or clinic. Record the average of the two measurements. To check your blood pressure when you are not at a hospital or clinic, you can use:  An automated blood pressure machine at a pharmacy.  A home blood pressure monitor.  Talk to your health care provider about your target blood  pressure.  If you are between 45-79 years old, ask your health care provider if you should take aspirin to prevent heart disease.  Have regular diabetes screenings by checking your fasting blood sugar level.  If you are at a normal weight and have a low risk for diabetes, have this test once every three years after the age of 45.  If you are overweight and have a high risk for diabetes, consider being tested at a younger age or more often.  A one-time screening for abdominal aortic aneurysm (AAA) by ultrasound is recommended for men aged 65-75 years who are current or former smokers. What should I know about preventing infection? Hepatitis B  If you have a higher risk for hepatitis B, you should be screened for this virus. Talk with your health care provider to find out if you are at risk for hepatitis B infection. Hepatitis C  Blood testing is recommended for:  Everyone born from 1945 through 1965.  Anyone with known risk factors for hepatitis C. Sexually Transmitted Diseases (STDs)  You should be screened each year for STDs including gonorrhea and chlamydia if:  You are sexually active and are younger than 81 years of age.  You are older than 81 years of age and your health care provider tells you that you are at risk for this type of infection.  Your sexual activity has changed since you were last screened and you are at an increased risk for chlamydia or gonorrhea. Ask your health care provider if you are at risk.  Talk with your health care provider about whether you are at high risk of being infected with HIV. Your health care provider may recommend a prescription medicine to help prevent HIV infection. What else can I do?  Schedule regular health, dental, and eye exams.  Stay current with your vaccines (immunizations).  Do not use any tobacco products, such as cigarettes, chewing tobacco, and e-cigarettes. If you need help quitting, ask your health care provider.  Limit  alcohol intake to no more than 2 drinks per day. One drink equals 12 ounces of beer, 5 ounces of wine, or 1 ounces of hard liquor.  Do not use street drugs.  Do not share needles.  Ask your health care provider for help if you need support or information about quitting drugs.  Tell your health care provider if you often feel depressed.  Tell your health care provider if you have ever been abused or do not feel safe at home. This information is not intended to replace advice given to you by your health care provider. Make sure you discuss any questions you have with your health care provider. Document Released: 03/14/2008 Document Revised: 05/15/2016 Document Reviewed: 06/20/2015 [employer] Interactive Patient Education  2017 [employer]  

## 2017-01-07 NOTE — Progress Notes (Signed)
OFFICE VISIT  01/07/2017   CC:  Chief Complaint  Patient presents with  . Follow-up    RCI, pt is not fasting.   . Medicare Wellness     HPI:    Patient is a 83 y.o. Caucasian male who presents for 4 mo f/u HTN, HLD.  All labs 08/26/16 were stable--no new recommendations.  BP: no home bp monitoring.  Compliant with bp meds.  HLD: taking pravachol w/out side effects. Says he eats a good diet.  Appetite is good.  Has hx of undesired wt loss and poor appetite but his appetite is back and he has gained wt.  Has recurrent runny nose when he eats.  ROS: no CP, no SOB, no abd pain, no n/v/d, no HAs, no dizziness, No recent LE swelling.  Past Medical History:  Diagnosis Date  . Anemia 02/2012   Mild iron deficiency  . Arthritis   . Ataxia 2014/15   brain MRI 02/2014 showed encephalomalacia parietal and occip lobes c/w old infarcts, plus diffuse cerebral atrophy, o/w normal  . Cataract   . DDD (degenerative disc disease)    L-spine  . Dermatitis    ?Contact derm? per [facility] MD: Dr. Threasa Alpha (05/09/16): punch bx 06/18/16: chronic eczematous derm/contact derm, neg fungal stain.  Allergy (RAST) testing NEG.  . GERD (gastroesophageal reflux disease)   . Heme positive stool 02/2012   colonoscopy normal.  EGD showed small hiatal hernia with associated Cameron's erosions.  Hemoccults NEG x 3 09/2014.  Marland Kitchen Hyperlipidemia   . Hypertension   . Macular degeneration, age related    exudative R eye; nonexudative L eye.  Vitreous degeneration bilat.  . Obesity (BMI 30-39.9)   . Retinal hemorrhage 2016   w/ retinal detachment (right eye) : anticoagulants stopped due to this  . Venous insufficiency of both lower extremities     Past Surgical History:  Procedure Laterality Date  . APPENDECTOMY  1935  . cataract surg     Bilateral/ 08-13  . COLONOSCOPY  05/12/12   for iron def anemia--NORMAL RESULT  . ESOPHAGOGASTRODUODENOSCOPY  06/17/12   Small hiatal hernia with associated  Cameron's erosions--likely source of his mild iron def anemia.  Marland Kitchen NASAL SINUS SURGERY     1998  . TONSILLECTOMY  1935    Outpatient Medications Prior to Visit  Medication Sig Dispense Refill  . Aflibercept (EYLEA IO) Inject into the eye. One injection into right eye every month    . amLODipine (NORVASC) 5 MG tablet Take 1 tablet (5 mg total) by mouth daily. 30 tablet 11  . cetirizine (ZYRTEC) 10 MG tablet Take 10 mg by mouth daily.  5  . Cholecalciferol (VITAMIN D3) 2000 UNITS TABS Take by mouth daily.      . furosemide (LASIX) 40 MG tablet Take 0.5 to 1 tablet by mouth every other day 30 tablet 3  . losartan-hydrochlorothiazide (HYZAAR) 100-25 MG tablet TAKE 1 TABLET BY MOUTH DAILY. 90 tablet 1  . Multiple Vitamins-Minerals (PRESERVISION AREDS 2 PO) Take by mouth 2 (two) times daily.    . multivitamin (THERAGRAN) per tablet Take 1 tablet by mouth daily.      Marland Kitchen omeprazole (PRILOSEC) 40 MG capsule Take 1 capsule (40 mg total) by mouth daily. 90 capsule 3  . pravastatin (PRAVACHOL) 80 MG tablet TAKE 1 TABLET (80 MG TOTAL) BY MOUTH DAILY. 90 tablet 3  . tobramycin (TOBREX) 0.3 % ophthalmic solution Place 1 drop into the right eye. Four times daily begin 1  day prior to treatment  5  . verapamil (CALAN-SR) 240 MG CR tablet TAKE 1 TABLET (240 MG TOTAL) BY MOUTH 2 (TWO) TIMES DAILY. 180 tablet 3  . pimecrolimus (ELIDEL) 1 % cream Apply topically 2 (two) times daily. (Patient not taking: Reported on 01/07/2017) 60 g 6  . Urea 40 % FOAM Apply to ankles rash daily (Patient not taking: Reported on 01/07/2017) 150 g 6   No facility-administered medications prior to visit.     No Known Allergies  ROS As per HPI  PE: Blood pressure 104/63, pulse 83, temperature 98.2 F (36.8 C), temperature source Oral, resp. rate 16, height 5\' 9"  (1.753 m), weight 201 lb 4 oz (91.3 kg), SpO2 95 %. Gen: Alert, well appearing.  Patient is oriented to person, place, time, and situation. AFFECT: pleasant, lucid  thought and speech. CV: RRR, 1/6 systolic flow murmur, S1 and S2 distinct, no diastolic murmur, no r/g. Chest is clear, no wheezing or rales. Normal symmetric air entry throughout both lung fields. No chest wall deformities or tenderness. EXT: no clubbing, cyanosis, or edema. Lower legs with extensive pink dryness and thickening/superficial fibrosis of anterior aspect.   LABS:  Lab Results  Component Value Date   TSH 2.54 08/26/2016   Lab Results  Component Value Date   WBC 9.6 08/26/2016   HGB 14.1 08/26/2016   HCT 42.2 08/26/2016   MCV 99.4 08/26/2016   PLT 458.0 (H) 08/26/2016   Lab Results  Component Value Date   CREATININE 1.12 01/07/2017   BUN 29 (H) 01/07/2017   NA 140 01/07/2017   K 3.9 01/07/2017   CL 103 01/07/2017   CO2 30 01/07/2017   Lab Results  Component Value Date   ALT 19 08/26/2016   AST 19 08/26/2016   ALKPHOS 91 08/26/2016   BILITOT 0.7 08/26/2016   Lab Results  Component Value Date   CHOL 214 (H) 08/26/2016   Lab Results  Component Value Date   HDL 68.40 08/26/2016   Lab Results  Component Value Date   LDLCALC 123 (H) 08/26/2016   Lab Results  Component Value Date   TRIG 115.0 08/26/2016   Lab Results  Component Value Date   CHOLHDL 3 08/26/2016   Lab Results  Component Value Date   PSA 1.45 10/08/2010    IMPRESSION AND PLAN:  1) HTN; The current medical regimen is effective;  continue present plan and medications. BMET today.  2) Hyperlipidemia: lipids good 07/2016.  Tolerating statin.  AST/ALT normal 07/2016.  3) Venous insufficiency of both LE's: stable, continue lasix 40mg  qod. Low Na diet.  His LE skin changes would likely be significantly improved if he would apply daily lotion or cream for moisturizer.  4) Gustatory rhinitis: start trial of ipratropium nasal spray before each meal: eRx'd today.  5) Hx of early satiety and abnormal wt loss---this seems to have completely resolved --appetite very good and wt is coming  back up. Lab eval for this in the past has been unrevealing.  No imaging was pursued.  Continue watchful waiting approach.  An After Visit Summary was printed and given to the patient.  FOLLOW UP: Return in about 6 months (around 07/09/2017) for routine chronic illness f/u.  Signed:  Crissie Sickles, MD           01/07/2017

## 2017-01-07 NOTE — Progress Notes (Signed)
Subjective:   Glen Barnett is a 83 y.o. male who presents for Medicare Annual/Subsequent preventive examination.  Review of Systems:  No ROS.  Medicare Wellness Visit.  Cardiac Risk Factors include: advanced age (>69mn, >>22women);male gender;hypertension;dyslipidemia;family history of premature cardiovascular disease;sedentary lifestyle   Sleep patterns: Sleeps 3-5 hours. Up to void x 1.  Home Safety/Smoke Alarms:  Smoke detectors and security in place.  Living environment; residence and Firearm Safety: Lives with son (and family) in 2 story home. Feels safe in home.  Seat Belt Safety/Bike Helmet: Wears seat belt.   Counseling:   Eye Exam-Last exam 12/2016, treatments for mac deg every 4 months. Dr. [facility]-Last exam < 6 months. Every 4-6 months by DSharilyn Sites  Male:   CCS-Colonoscopy 05/12/2012, normal.      PSA-10/08/2010, 1.450.      Objective:    Vitals: BP 104/63 (BP Location: Left Arm, Patient Position: Sitting, Cuff Size: Normal)   Pulse 83   Temp 98.2 F (36.8 C) (Oral)   Resp 16   Ht _0  (1.753 m)   Wt 201 lb 4 oz (91.3 kg)   SpO2 95%   BMI 29.72 kg/m   Body mass index is 29.72 kg/m.  Tobacco History  Smoking Status  . Never Smoker  Smokeless Tobacco  . Never Used     Counseling given: No   Past Medical History:  Diagnosis Date  . Anemia 02/2012   Mild iron deficiency  . Arthritis   . Ataxia 2014/15   brain MRI 02/2014 showed encephalomalacia parietal and occip lobes c/w old infarcts, plus diffuse cerebral atrophy, o/w normal  . Cataract   . DDD (degenerative disc disease)    L-spine  . Dermatitis    ?Contact derm? [facility]: Dr. SThreasa Alpha(05/09/16): punch bx 06/18/16: chronic eczematous derm/contact derm, neg fungal stain.  Allergy (RAST) testing NEG.  . GERD (gastroesophageal reflux disease)   . Heme positive stool 02/2012   colonoscopy normal.  EGD showed small hiatal hernia with associated Cameron's  erosions.  Hemoccults NEG x 3 09/2014.  .Marland KitchenHyperlipidemia   . Hypertension   . Macular degeneration, age related    exudative R eye; nonexudative L eye.  Vitreous degeneration bilat.  . Obesity (BMI 30-39.9)   . Retinal hemorrhage 2016   w/ retinal detachment (right eye) : anticoagulants stopped due to this  . Venous insufficiency of both lower extremities    Past Surgical History:  Procedure Laterality Date  . APPENDECTOMY  1935  . cataract surg     Bilateral/ 08-13  . COLONOSCOPY  05/12/12   for iron def anemia--NORMAL RESULT  . ESOPHAGOGASTRODUODENOSCOPY  06/17/12   Small hiatal hernia with associated Cameron's erosions--likely source of his mild iron def anemia.  .Marland KitchenNASAL SINUS SURGERY     1998  . TONSILLECTOMY  1935   Family History  Problem Relation Age of Onset  . Liver cancer Mother    History  Sexual Activity  . Sexual activity: Not on file    Outpatient Encounter Prescriptions as of 01/07/2017  Medication Sig  . Aflibercept (EYLEA IO) Inject into the eye. One injection into right eye every month  . amLODipine (NORVASC) 5 MG tablet Take 1 tablet (5 mg total) by mouth daily.  . cetirizine (ZYRTEC) 10 MG tablet Take 10 mg by mouth daily.  . Cholecalciferol (VITAMIN D3) 2000 UNITS TABS Take by mouth daily.    . furosemide (LASIX) 40  MG tablet Take 0.5 to 1 tablet by mouth every other day  . losartan-hydrochlorothiazide (HYZAAR) 100-25 MG tablet TAKE 1 TABLET BY MOUTH DAILY.  . Multiple Vitamins-Minerals (PRESERVISION AREDS 2 PO) Take by mouth 2 (two) times daily.  . multivitamin (THERAGRAN) per tablet Take 1 tablet by mouth daily.    Marland Kitchen omeprazole (PRILOSEC) 40 MG capsule Take 1 capsule (40 mg total) by mouth daily.  . pravastatin (PRAVACHOL) 80 MG tablet TAKE 1 TABLET (80 MG TOTAL) BY MOUTH DAILY.  Marland Kitchen tobramycin (TOBREX) 0.3 % ophthalmic solution Place 1 drop into the right eye. Four times daily begin 1 day prior to treatment  . verapamil (CALAN-SR) 240 MG CR tablet TAKE 1  TABLET (240 MG TOTAL) BY MOUTH 2 (TWO) TIMES DAILY.  Marland Kitchen ipratropium (ATROVENT) 0.03 % nasal spray 1-2 sprays in each nostril about 10 minutes prior to each meal (qid)  . [DISCONTINUED] pimecrolimus (ELIDEL) 1 % cream Apply topically 2 (two) times daily. (Patient not taking: Reported on 01/07/2017)  . [DISCONTINUED] Urea 40 % FOAM Apply to ankles rash daily (Patient not taking: Reported on 01/07/2017)   No facility-administered encounter medications on file as of 01/07/2017.     Activities of Daily Living In your present state of health, do you have any difficulty performing the following activities: 01/07/2017  Hearing? N  Vision? N  Difficulty concentrating or making decisions? Y  Walking or climbing stairs? Y  Dressing or bathing? N  Doing errands, shopping? N  Preparing Food and eating ? N  Using the Toilet? N  In the past six months, have you accidently leaked urine? N  Do you have problems with loss of bowel control? N  Managing your Medications? N  Managing your Finances? N  Housekeeping or managing your Housekeeping? N  Some recent data might be hidden    Patient Care Team: Tammi Sou, MD as PCP - General Milus Banister, MD as Consulting Physician (Gastroenterology) Sherlynn Stalls, MD as Consulting Physician (Ophthalmology) Pablo Lawrence as Consulting Physician (Allergy and Immunology)   Assessment:    Physical assessment deferred to PCP.  Exercise Activities and Dietary recommendations Current Exercise Habits: The patient does not participate in regular exercise at present   Diet (meal preparation, eat out, water intake, caffeinated beverages, dairy products, fruits and vegetables): Fruits and vegetables. Drinks coffee throughout the day (down to 6 cups).   Breakfast: Eggs, toast, coffee, fruit, potato, [employer] buffet Lunch: [employer] Dinner: Meat, vegetables  Discussed diet, encouraged to eat at least 3 meals/day and increase water. Has no interest in  increasing activity.   Goals      Patient Stated   . patient (pt-stated)          Maintain current health      Fall Risk Fall Risk  01/07/2017 10/18/2015  Falls in the past year? No No   Depression Screen PHQ 2/9 Scores 01/07/2017 10/18/2015  PHQ - 2 Score 0 0    Cognitive Function MMSE - Mini Mental State Exam 01/07/2017  Orientation to time 5  Orientation to Place 5  Registration 3  Attention/ Calculation 5  Recall 2  Language- name 2 objects 2  Language- repeat 1  Language- follow 3 step command 3  Language- read & follow direction 1  Write a sentence 0  Copy design 1  Total score 28        Immunization History  Administered Date(s) Administered  . Influenza Split 07/04/2011, 08/11/2012, 08/21/2014  . Influenza, High  Dose Seasonal PF 06/18/2016  . Influenza,inj,Quad PF,36+ Mos 06/07/2013  . Influenza-Unspecified 06/01/2015  . Pneumococcal Conjugate-13 09/08/2014  . Pneumococcal Polysaccharide-23 09/30/1996  . Td 10/01/2007  . Zoster 10/01/2007   Screening Tests Health Maintenance  Topic Date Due  . INFLUENZA VACCINE  04/30/2017  . TETANUS/TDAP  09/30/2017  . PNA vac Low Risk Adult  Completed      Plan:     Bring a copy of your advance directives to your next office visit.  Continue doing brain stimulating activities (puzzles, reading, adult coloring books, staying active) to keep memory sharp.    During the course of the visit the patient was educated and counseled about the following appropriate screening and preventive services:   Vaccines to include Pneumoccal, Influenza, Hepatitis B, Td, Zostavax, HCV  Cardiovascular Disease  Colorectal cancer screening  Diabetes screening  Prostate Cancer Screening  Glaucoma screening  Nutrition counseling    Patient Instructions (the written plan) was given to the patient.    Gerilyn Nestle, RN  01/07/2017

## 2017-01-09 ENCOUNTER — Other Ambulatory Visit: Payer: Self-pay | Admitting: *Deleted

## 2017-01-09 MED ORDER — PRAVASTATIN SODIUM 80 MG PO TABS: ORAL_TABLET | ORAL | 3 refills | Status: DC

## 2017-01-09 NOTE — Telephone Encounter (Signed)
[facility].  RF request for pravastatin LOV: 01/07/17 Next ov:  07/07/17 Last written: 11/24/15 #90 w/ 3RF

## 2017-01-09 NOTE — Progress Notes (Signed)
AWV reviewed and agree.  Signed:  Crissie Sickles, MD           01/09/2017

## 2017-04-23 DIAGNOSIS — H353122 Nonexudative age-related macular degeneration, left eye, intermediate dry stage: Secondary | ICD-10-CM | POA: Diagnosis not present

## 2017-04-23 DIAGNOSIS — H353211 Exudative age-related macular degeneration, right eye, with active choroidal neovascularization: Secondary | ICD-10-CM | POA: Diagnosis not present

## 2017-05-02 DIAGNOSIS — H353211 Exudative age-related macular degeneration, right eye, with active choroidal neovascularization: Secondary | ICD-10-CM | POA: Diagnosis not present

## 2017-05-13 ENCOUNTER — Other Ambulatory Visit: Payer: Self-pay | Admitting: Family Medicine

## 2017-05-13 DIAGNOSIS — I10 Essential (primary) hypertension: Secondary | ICD-10-CM

## 2017-05-13 NOTE — Telephone Encounter (Signed)
[facility] 

## 2017-06-24 ENCOUNTER — Other Ambulatory Visit: Payer: Self-pay | Admitting: Family Medicine

## 2017-06-25 DIAGNOSIS — H353122 Nonexudative age-related macular degeneration, left eye, intermediate dry stage: Secondary | ICD-10-CM | POA: Diagnosis not present

## 2017-06-25 DIAGNOSIS — H35412 Lattice degeneration of retina, left eye: Secondary | ICD-10-CM | POA: Diagnosis not present

## 2017-06-25 DIAGNOSIS — H353211 Exudative age-related macular degeneration, right eye, with active choroidal neovascularization: Secondary | ICD-10-CM | POA: Diagnosis not present

## 2017-06-25 DIAGNOSIS — H43813 Vitreous degeneration, bilateral: Secondary | ICD-10-CM | POA: Diagnosis not present

## 2017-06-30 ENCOUNTER — Ambulatory Visit: Payer: Medicare Other

## 2017-06-30 ENCOUNTER — Ambulatory Visit (INDEPENDENT_AMBULATORY_CARE_PROVIDER_SITE_OTHER): Payer: Medicare Other | Admitting: Family Medicine

## 2017-06-30 ENCOUNTER — Encounter: Payer: Self-pay | Admitting: Family Medicine

## 2017-06-30 VITALS — BP 110/62 | HR 59 | Temp 98.0°F | Resp 16 | Ht 69.0 in | Wt 205.0 lb

## 2017-06-30 DIAGNOSIS — Z23 Encounter for immunization: Secondary | ICD-10-CM

## 2017-06-30 DIAGNOSIS — R600 Localized edema: Secondary | ICD-10-CM

## 2017-06-30 DIAGNOSIS — I872 Venous insufficiency (chronic) (peripheral): Secondary | ICD-10-CM

## 2017-06-30 DIAGNOSIS — E7800 Pure hypercholesterolemia, unspecified: Secondary | ICD-10-CM | POA: Diagnosis not present

## 2017-06-30 DIAGNOSIS — I10 Essential (primary) hypertension: Secondary | ICD-10-CM | POA: Diagnosis not present

## 2017-06-30 DIAGNOSIS — N182 Chronic kidney disease, stage 2 (mild): Secondary | ICD-10-CM | POA: Diagnosis not present

## 2017-06-30 LAB — BASIC METABOLIC PANEL
BUN: 15 mg/dL (ref 6–23)
CALCIUM: 9.3 mg/dL (ref 8.4–10.5)
CO2: 31 mEq/L (ref 19–32)
CREATININE: 0.9 mg/dL (ref 0.40–1.50)
Chloride: 100 mEq/L (ref 96–112)
GFR: 84.66 mL/min (ref >60.00)
GLUCOSE: 97 mg/dL (ref 70–99)
POTASSIUM: 4.1 meq/L (ref 3.5–5.1)
Sodium: 139 mEq/L (ref 135–145)

## 2017-06-30 LAB — LIPID PANEL
CHOLESTEROL: 192 mg/dL (ref 0–200)
HDL: 57.2 mg/dL (ref >39.00)
LDL Cholesterol: 104 mg/dL — ABNORMAL HIGH (ref 0–99)
NONHDL: 135.2
Total CHOL/HDL Ratio: 3
Triglycerides: 156 mg/dL — ABNORMAL HIGH (ref 0.0–149.0)
VLDL: 31.2 mg/dL (ref 0.0–40.0)

## 2017-06-30 NOTE — Progress Notes (Signed)
OFFICE VISIT  06/30/2017   CC:  Chief Complaint  Patient presents with  . Follow-up    RCI     HPI:    Patient is a 83 y.o. Caucasian male who presents for 6 mo f/u HTN, HLD,  CRI II (GFR 60s), and venous insuff edema of both LL's. Doing ok.  Does lots of reading and watching TV.  No exercise.  Diet is "ok" but then says he always eats out at restaurants.  Ongoing probs with short term memory. No driving problems. Can prepare food, feed himself.  Bathes w/out assistance.   He is managing his finances.   He manages his medications.  HTN: home bp monitoring 120/80 or less. CRI: only rarely takes aleve b/c it does help his episodic LBP. LE edema: taking 1/2 lasix tab qod.  Says ankles and feet much improved regarding edema. Takes 1/2 of 40mg  lasix qod.   HLD: tolerating statin, compliant with med daily.  No eating low fat/low chol diet. He does not get any exercise: "I swore off exercise in 1956".  He wants his med list reviewed to see if he can get off of any of the meds.  ROS: no CP, no SOB, no n/v/d, no rash, no fevers, no polyuria or polydipsia, no melena or hematochezia. Developing R trigger thumb (has had trigger finger injection and then surgery in L hand by an ortho named Dr. Ballard Russell in the past).  Past Medical History:  Diagnosis Date  . Anemia 02/2012   Mild iron deficiency  . Arthritis   . Ataxia 2014/15   brain MRI 02/2014 showed encephalomalacia parietal and occip lobes c/w old infarcts, plus diffuse cerebral atrophy, o/w normal  . Cataract   . Chronic renal insufficiency, stage 2 (mild) 12/2016   GFR 60s-70s  . DDD (degenerative disc disease)    L-spine  . Dermatitis    ?Contact derm? per [facility] MD: Dr. Threasa Alpha (05/09/16): punch bx 06/18/16: chronic eczematous derm/contact derm, neg fungal stain.  Allergy (RAST) testing NEG.  . GERD (gastroesophageal reflux disease)   . Heme positive stool 02/2012   colonoscopy normal.  EGD showed small hiatal hernia  with associated Cameron's erosions.  Hemoccults NEG x 3 09/2014.  Marland Kitchen Hyperlipidemia   . Hypertension   . Macular degeneration, age related    exudative R eye; nonexudative L eye.  Vitreous degeneration bilat.  . Obesity (BMI 30-39.9)   . Retinal hemorrhage 2016   w/ retinal detachment (right eye) : anticoagulants stopped due to this  . Venous insufficiency of both lower extremities     Past Surgical History:  Procedure Laterality Date  . APPENDECTOMY  1935  . cataract surg     Bilateral/ 08-13  . COLONOSCOPY  05/12/12   for iron def anemia--NORMAL RESULT  . ESOPHAGOGASTRODUODENOSCOPY  06/17/12   Small hiatal hernia with associated Cameron's erosions--likely source of his mild iron def anemia.  Marland Kitchen NASAL SINUS SURGERY     1998  . TONSILLECTOMY  1935    Outpatient Medications Prior to Visit  Medication Sig Dispense Refill  . Aflibercept (EYLEA IO) Inject into the eye. One injection into right eye every month    . cetirizine (ZYRTEC) 10 MG tablet Take 10 mg by mouth daily.  5  . Cholecalciferol (VITAMIN D3) 2000 UNITS TABS Take by mouth daily.      . furosemide (LASIX) 40 MG tablet Take 0.5 to 1 tablet by mouth every other day 30 tablet 3  .  ipratropium (ATROVENT) 0.03 % nasal spray 1-2 sprays in each nostril about 10 minutes prior to each meal (qid) 30 mL 12  . losartan-hydrochlorothiazide (HYZAAR) 100-25 MG tablet TAKE 1 TABLET BY MOUTH EVERY DAY 90 tablet 1  . Multiple Vitamins-Minerals (PRESERVISION AREDS 2 PO) Take by mouth 2 (two) times daily.    . multivitamin (THERAGRAN) per tablet Take 1 tablet by mouth daily.      Marland Kitchen omeprazole (PRILOSEC) 40 MG capsule Take 1 capsule (40 mg total) by mouth daily. 90 capsule 3  . pravastatin (PRAVACHOL) 80 MG tablet TAKE 1 TABLET (80 MG TOTAL) BY MOUTH DAILY. 90 tablet 3  . tobramycin (TOBREX) 0.3 % ophthalmic solution Place 1 drop into the right eye. Four times daily begin 1 day prior to treatment  5  . verapamil (CALAN-SR) 240 MG CR tablet TAKE  1 TABLET (240 MG TOTAL) BY MOUTH 2 (TWO) TIMES DAILY. 180 tablet 1  . amLODipine (NORVASC) 5 MG tablet Take 1 tablet (5 mg total) by mouth daily. 30 tablet 11   No facility-administered medications prior to visit.     No Known Allergies  ROS As per HPI  PE: Blood pressure 110/62, pulse (!) 59, temperature 98 F (36.7 C), temperature source Temporal, resp. rate 16, height 5\' 9"  (1.753 m), weight 205 lb (93 kg), SpO2 95 %. Gen: Alert, well appearing.  Patient is oriented to person, place, time, and situation. AFFECT: pleasant, lucid thought and speech. CV: RRR, with occ brief pause.  2/6 systolic murmur.  No r/g. Chest is clear, no wheezing or rales. Normal symmetric air entry throughout both lung fields. No chest wall deformities or tenderness. ABD: soft, NT, ND, BS normal.  No hepatospenomegaly or mass.  No bruits.  No asymmetry to abdominal wall. EXT: no clubbing or cyanosis.  1-2 + pitting edema in both LL's from mid tibia into tops of feet.  No erythema or ulceration.   LABS:  Lab Results  Component Value Date   TSH 2.54 08/26/2016   Lab Results  Component Value Date   WBC 9.6 08/26/2016   HGB 14.1 08/26/2016   HCT 42.2 08/26/2016   MCV 99.4 08/26/2016   PLT 458.0 (H) 08/26/2016   Lab Results  Component Value Date   CREATININE 0.90 06/30/2017   BUN 15 06/30/2017   NA 139 06/30/2017   K 4.1 06/30/2017   CL 100 06/30/2017   CO2 31 06/30/2017   Lab Results  Component Value Date   ALT 19 08/26/2016   AST 19 08/26/2016   ALKPHOS 91 08/26/2016   BILITOT 0.7 08/26/2016   Lab Results  Component Value Date   CHOL 192 06/30/2017   Lab Results  Component Value Date   HDL 57.20 06/30/2017   Lab Results  Component Value Date   LDLCALC 104 (H) 06/30/2017   Lab Results  Component Value Date   TRIG 156.0 (H) 06/30/2017   Lab Results  Component Value Date   CHOLHDL 3 06/30/2017   Lab Results  Component Value Date   PSA 1.45 10/08/2010    IMPRESSION AND  PLAN:  1) HTN; good control.  In interest of limiting meds, I feel like his bp control allows to d/c amlodipine at this time. Continue periodic home bp monitoring. Check lytes/cr today.  2) HLD: tolerating statin.  FLP check today. If cholesterol numbers are excellent, he is in strong favor of trial off this med.  3) CRI stage II (GFR 60s): lytes/cr check today.  Avoid  regular use of NSAIDs.  Pay close attention to adequate hydration.  4) Chronic bilat LE swelling/edema from chronic venous insufficiency: stable.  Recommended he continue 1/2 of 40mg  lasix qod, continue periodic elevation of legs.  He does not eat a low Na diet and essentially admits he is unable to do this.  An After Visit Summary was printed and given to the patient.  FOLLOW UP: Return in about 6 months (around 12/29/2017).  Signed:  Crissie Sickles, MD           06/30/2017

## 2017-07-08 ENCOUNTER — Ambulatory Visit: Payer: Medicare Other | Admitting: Family Medicine

## 2017-08-21 ENCOUNTER — Other Ambulatory Visit: Payer: Self-pay | Admitting: Family Medicine

## 2017-08-29 DIAGNOSIS — H353211 Exudative age-related macular degeneration, right eye, with active choroidal neovascularization: Secondary | ICD-10-CM | POA: Diagnosis not present

## 2017-08-29 DIAGNOSIS — H43813 Vitreous degeneration, bilateral: Secondary | ICD-10-CM | POA: Diagnosis not present

## 2017-08-29 DIAGNOSIS — H353121 Nonexudative age-related macular degeneration, left eye, early dry stage: Secondary | ICD-10-CM | POA: Diagnosis not present

## 2017-08-29 DIAGNOSIS — H35412 Lattice degeneration of retina, left eye: Secondary | ICD-10-CM | POA: Diagnosis not present

## 2017-09-15 ENCOUNTER — Other Ambulatory Visit: Payer: Self-pay | Admitting: Family Medicine

## 2017-11-03 ENCOUNTER — Other Ambulatory Visit: Payer: Self-pay

## 2017-11-03 DIAGNOSIS — I10 Essential (primary) hypertension: Secondary | ICD-10-CM

## 2017-11-03 MED ORDER — VERAPAMIL HCL ER 240 MG PO TBCR: EXTENDED_RELEASE_TABLET | ORAL | 1 refills | Status: DC

## 2017-11-07 DIAGNOSIS — H35412 Lattice degeneration of retina, left eye: Secondary | ICD-10-CM | POA: Diagnosis not present

## 2017-11-07 DIAGNOSIS — H35372 Puckering of macula, left eye: Secondary | ICD-10-CM | POA: Diagnosis not present

## 2017-11-07 DIAGNOSIS — H353121 Nonexudative age-related macular degeneration, left eye, early dry stage: Secondary | ICD-10-CM | POA: Diagnosis not present

## 2017-11-07 DIAGNOSIS — H353211 Exudative age-related macular degeneration, right eye, with active choroidal neovascularization: Secondary | ICD-10-CM | POA: Diagnosis not present

## 2017-12-12 ENCOUNTER — Other Ambulatory Visit: Payer: Self-pay

## 2017-12-12 MED ORDER — LOSARTAN POTASSIUM-HCTZ 100-25 MG PO TABS: 1.0000 | ORAL_TABLET | Freq: Every day | ORAL | 0 refills | Status: DC

## 2017-12-29 ENCOUNTER — Ambulatory Visit (INDEPENDENT_AMBULATORY_CARE_PROVIDER_SITE_OTHER): Payer: Medicare Other | Admitting: Family Medicine

## 2017-12-29 ENCOUNTER — Encounter: Payer: Self-pay | Admitting: Family Medicine

## 2017-12-29 VITALS — BP 142/82 | HR 66 | Temp 97.4°F | Resp 16 | Ht 69.0 in | Wt 209.0 lb

## 2017-12-29 DIAGNOSIS — N182 Chronic kidney disease, stage 2 (mild): Secondary | ICD-10-CM

## 2017-12-29 DIAGNOSIS — I10 Essential (primary) hypertension: Secondary | ICD-10-CM | POA: Diagnosis not present

## 2017-12-29 DIAGNOSIS — E7800 Pure hypercholesterolemia, unspecified: Secondary | ICD-10-CM

## 2017-12-29 DIAGNOSIS — I872 Venous insufficiency (chronic) (peripheral): Secondary | ICD-10-CM | POA: Diagnosis not present

## 2017-12-29 DIAGNOSIS — Z79899 Other long term (current) drug therapy: Secondary | ICD-10-CM

## 2017-12-29 LAB — BASIC METABOLIC PANEL
BUN: 22 mg/dL (ref 6–23)
CO2: 29 meq/L (ref 19–32)
Calcium: 9.2 mg/dL (ref 8.4–10.5)
Chloride: 101 mEq/L (ref 96–112)
Creatinine, Ser: 0.84 mg/dL (ref 0.40–1.50)
GFR: 91.57 mL/min (ref >60.00)
GLUCOSE: 112 mg/dL — AB (ref 70–99)
Potassium: 3.9 mEq/L (ref 3.5–5.1)
SODIUM: 138 meq/L (ref 135–145)

## 2017-12-29 MED ORDER — OMEPRAZOLE 40 MG PO CPDR: DELAYED_RELEASE_CAPSULE | ORAL | 3 refills | Status: DC

## 2017-12-29 NOTE — Patient Instructions (Signed)
Stop your pravastatin.  Take your omeprazole (reflux med) every other day for 2 weeks, then take this med only twice per week.

## 2017-12-29 NOTE — Progress Notes (Signed)
OFFICE VISIT  12/29/2017   CC:  Chief Complaint  Patient presents with  . Follow-up    RCI, pt is not fasting.     HPI:    Patient is a 84 y.o. Caucasian male who presents for 6 mo f/u HTN, HLD, CRI with GFR in the 60s, + venous insufficiency edema of both LL's.  Doing ok. Eats, watches TV, eats a lot---these are his hobbies. He is happy living this way.  HTN: no home bp monitoring.  Compliant with med. HLD: tolerates his statin. LE edema: he has not had this in 3 weeks.   Has lasix to use prn.  CRI: avoids NSAIDs.  Tries to hydrate well.  Wants to know what meds he can possibly get off of.  Past Medical History:  Diagnosis Date  . Anemia 02/2012   Mild iron deficiency  . Arthritis   . Ataxia 2014/15   brain MRI 02/2014 showed encephalomalacia parietal and occip lobes c/w old infarcts, plus diffuse cerebral atrophy, o/w normal  . Cataract   . Chronic renal insufficiency, stage 2 (mild) 12/2016   GFR 60s-70s  . DDD (degenerative disc disease)    L-spine  . Dermatitis    ?Contact derm? per [facility] MD: Dr. Threasa Alpha (05/09/16): punch bx 06/18/16: chronic eczematous derm/contact derm, neg fungal stain.  Allergy (RAST) testing NEG.  . GERD (gastroesophageal reflux disease)   . Heme positive stool 02/2012   colonoscopy normal.  EGD showed small hiatal hernia with associated Cameron's erosions.  Hemoccults NEG x 3 09/2014.  Marland Kitchen Hyperlipidemia   . Hypertension   . Macular degeneration, age related    exudative R eye; nonexudative L eye.  Vitreous degeneration bilat.  . Obesity (BMI 30-39.9)   . Retinal hemorrhage 2016   w/ retinal detachment (right eye) : anticoagulants stopped due to this  . Venous insufficiency of both lower extremities     Past Surgical History:  Procedure Laterality Date  . APPENDECTOMY  1935  . cataract surg     Bilateral/ 08-13  . COLONOSCOPY  05/12/12   for iron def anemia--NORMAL RESULT  . ESOPHAGOGASTRODUODENOSCOPY  06/17/12   Small hiatal  hernia with associated Cameron's erosions--likely source of his mild iron def anemia.  Marland Kitchen NASAL SINUS SURGERY     1998  . TONSILLECTOMY  1935    Outpatient Medications Prior to Visit  Medication Sig Dispense Refill  . Aflibercept (EYLEA IO) Inject into the eye. One injection into right eye every month    . cetirizine (ZYRTEC) 10 MG tablet Take 10 mg by mouth daily.  5  . Cholecalciferol (VITAMIN D3) 2000 UNITS TABS Take by mouth daily.      Marland Kitchen losartan-hydrochlorothiazide (HYZAAR) 100-25 MG tablet Take 1 tablet by mouth daily. 90 tablet 0  . Multiple Vitamins-Minerals (PRESERVISION AREDS 2 PO) Take by mouth 2 (two) times daily.    . multivitamin (THERAGRAN) per tablet Take 1 tablet by mouth daily.      Marland Kitchen omeprazole (PRILOSEC) 40 MG capsule TAKE 1 CAPSULE (40 MG TOTAL) BY MOUTH DAILY. 90 capsule 3  . pravastatin (PRAVACHOL) 80 MG tablet TAKE 1 TABLET (80 MG TOTAL) BY MOUTH DAILY. 90 tablet 3  . tobramycin (TOBREX) 0.3 % ophthalmic solution Place 1 drop into the right eye. Four times daily begin 1 day prior to treatment  5  . verapamil (CALAN-SR) 240 MG CR tablet TAKE 1 TABLET (240 MG TOTAL) BY MOUTH 2 (TWO) TIMES DAILY. 180 tablet 1  .  furosemide (LASIX) 40 MG tablet Take 0.5 to 1 tablet by mouth every other day (Patient not taking: Reported on 12/29/2017) 30 tablet 3  . ipratropium (ATROVENT) 0.03 % nasal spray 1-2 sprays in each nostril about 10 minutes prior to each meal (qid) (Patient not taking: Reported on 12/29/2017) 30 mL 12   No facility-administered medications prior to visit.     No Known Allergies  ROS As per HPI  PE:  BP recheck today 140/82. Blood pressure (!) 158/82, pulse 66, temperature (!) 97.4 F (36.3 C), temperature source Oral, resp. rate 16, height 5\' 9"  (1.753 m), weight 209 lb (94.8 kg), SpO2 96 %. Body mass index is 30.86 kg/m.  Gen: Alert, well appearing.  Patient is oriented to person, place, time, and situation. AFFECT: pleasant, lucid thought and speech. CV:  RRR, no m/r/g.   LUNGS: CTA bilat, nonlabored resps, good aeration in all lung fields. EXT: no clubbing or cyanosis.  1+ bilat pitting edema, with bilat LL hyperkeratosis pretibial region.  LABS:    Chemistry      Component Value Date/Time   NA 139 06/30/2017 1428   K 4.1 06/30/2017 1428   CL 100 06/30/2017 1428   CO2 31 06/30/2017 1428   BUN 15 06/30/2017 1428   CREATININE 0.90 06/30/2017 1428      Component Value Date/Time   CALCIUM 9.3 06/30/2017 1428   ALKPHOS 91 08/26/2016 1131   AST 19 08/26/2016 1131   ALT 19 08/26/2016 1131   BILITOT 0.7 08/26/2016 1131      Lab Results  Component Value Date   CHOL 192 06/30/2017   HDL 57.20 06/30/2017   LDLCALC 104 (H) 06/30/2017   LDLDIRECT 112.5 09/11/2011   TRIG 156.0 (H) 06/30/2017   CHOLHDL 3 06/30/2017    IMPRESSION AND PLAN:  1) HTN: not ideal control but in discussion with pt today, decided goal bp 140s/90s--no med changes today. BMET today.  2) CRI with GFR in the 60s: lytes/cr today.  Avoids NSAIDs, tries to hydrate well.  3) HLD: tolerating statin.  Last lipid panel 6 mo ago not too bad.  4) Chronic LE venous insufficiency, with hx of significant LE edema from this: doing well currently and not requiring any furosemide at all.  5) Polypharmacy: pt really wants to get off any meds he can: discussed lack of good evidence for use of statins in pt his age w/out hx of CVA or CAD.  Decided to stop his pravastatin at this time. Also, will attempt to decrease omeprazole to twice weekly dosing, and eventually d/c altogether if he does well with twice weekly dosing for a month or so.  An After Visit Summary was printed and given to the patient.  FOLLOW UP: 6 mo RCI, + AWV with Roderic Ovens at pt's earliest convenience.  Signed:  Crissie Sickles, MD           01/05/2018

## 2018-01-08 ENCOUNTER — Other Ambulatory Visit: Payer: Self-pay | Admitting: Family Medicine

## 2018-01-14 DIAGNOSIS — H353211 Exudative age-related macular degeneration, right eye, with active choroidal neovascularization: Secondary | ICD-10-CM | POA: Diagnosis not present

## 2018-01-14 DIAGNOSIS — H35412 Lattice degeneration of retina, left eye: Secondary | ICD-10-CM | POA: Diagnosis not present

## 2018-01-14 DIAGNOSIS — H353121 Nonexudative age-related macular degeneration, left eye, early dry stage: Secondary | ICD-10-CM | POA: Diagnosis not present

## 2018-01-14 DIAGNOSIS — H35372 Puckering of macula, left eye: Secondary | ICD-10-CM | POA: Diagnosis not present

## 2018-01-29 NOTE — Progress Notes (Signed)
Subjective:   Glen Barnett is a 84 y.o. male who presents for Medicare Annual/Subsequent preventive examination.  Review of Systems:  No ROS.  Medicare Wellness Visit. Additional risk factors are reflected in the social history.  Cardiac Risk Factors include: advanced age (>16men, >83 women);dyslipidemia;male gender;hypertension;obesity (BMI >30kg/m2);sedentary lifestyle   Sleep patterns: Sleeps poorly, about 5 hours.  Home Safety/Smoke Alarms: Feels safe in home. Smoke alarms in place.  Living environment; residence and Firearm Safety: Lives with son in 2 story home, remains on first floor. Seat Belt Safety/Bike Helmet: Wears seat belt.    Male:   CCS-Colonoscopy 05/12/2012, normal.      PSA-  Lab Results  Component Value Date   PSA 1.45 10/08/2010       Objective:    Vitals: BP 128/60 (BP Location: Left Arm, Patient Position: Sitting, Cuff Size: Normal)   Pulse 62   Ht 5\' 9"  (1.753 m)   Wt 207 lb 4 oz (94 kg)   SpO2 95%   BMI 30.61 kg/m   Body mass index is 30.61 kg/m.  Advanced Directives 01/30/2018 01/07/2017  Does Patient Have a Medical Advance Directive? Yes Yes  Type of Paramedic of [place];Living will Living will;Healthcare Power of [place] in Chart? Yes No - copy requested    Tobacco Social History   Tobacco Use  Smoking Status Never Smoker  Smokeless Tobacco Never Used     Counseling given: Not Answered    Past Medical History:  Diagnosis Date  . Anemia 02/2012   Mild iron deficiency  . Arthritis   . Ataxia 2014/15   brain MRI 02/2014 showed encephalomalacia parietal and occip lobes c/w old infarcts, plus diffuse cerebral atrophy, o/w normal  . Cataract   . Chronic renal insufficiency, stage 2 (mild) 12/2016   GFR 60s-70s  . DDD (degenerative disc disease)    L-spine  . Dermatitis    ?Contact derm? per [facility] MD: Dr. Threasa Alpha (05/09/16): punch bx 06/18/16:  chronic eczematous derm/contact derm, neg fungal stain.  Allergy (RAST) testing NEG.  . GERD (gastroesophageal reflux disease)   . Heme positive stool 02/2012   colonoscopy normal.  EGD showed small hiatal hernia with associated Cameron's erosions.  Hemoccults NEG x 3 09/2014.  Marland Kitchen Hyperlipidemia   . Hypertension   . Macular degeneration, age related    exudative R eye; nonexudative L eye.  Vitreous degeneration bilat.  . Obesity (BMI 30-39.9)   . Retinal hemorrhage 2016   w/ retinal detachment (right eye) : anticoagulants stopped due to this  . Venous insufficiency of both lower extremities    Past Surgical History:  Procedure Laterality Date  . APPENDECTOMY  1935  . cataract surg     Bilateral/ 08-13  . COLONOSCOPY  05/12/12   for iron def anemia--NORMAL RESULT  . ESOPHAGOGASTRODUODENOSCOPY  06/17/12   Small hiatal hernia with associated Cameron's erosions--likely source of his mild iron def anemia.  Marland Kitchen NASAL SINUS SURGERY     1998  . TONSILLECTOMY  1935   Family History  Problem Relation Age of Onset  . Liver cancer Mother    Social History   Socioeconomic History  . Marital status: Widowed    Spouse name: Not on file  . Number of children: Not on file  . Years of education: Not on file  . Highest education level: Not on file  Occupational History  . Not on file  Social  Needs  . Financial resource strain: Not on file  . Food insecurity:    Worry: Not on file    Inability: Not on file  . Transportation needs:    Medical: Not on file    Non-medical: Not on file  Tobacco Use  . Smoking status: Never Smoker  . Smokeless tobacco: Never Used  Substance and Sexual Activity  . Alcohol use: No  . Drug use: No  . Sexual activity: Not on file  Lifestyle  . Physical activity:    Days per week: Not on file    Minutes per session: Not on file  . Stress: Not on file  Relationships  . Social connections:    Talks on phone: Not on file    Gets together: Not on file     Attends religious service: Not on file    Active member of club or organization: Not on file    Attends meetings of clubs or organizations: Not on file    Relationship status: Not on file  Other Topics Concern  . Not on file  Social History Narrative   Widower.  Wife died 23-Sep-2011 of brain cancer.   He relocated from [place] to live near son.   NO exercise.  No T/A/Ds.   Retired Chief Financial Officer.    Outpatient Encounter Medications as of 01/30/2018  Medication Sig  . Aflibercept (EYLEA IO) Inject into the eye. One injection into right eye every month  . cetirizine (ZYRTEC) 10 MG tablet Take 10 mg by mouth daily.  . Cholecalciferol (VITAMIN D3) 2000 UNITS TABS Take by mouth daily.    Marland Kitchen losartan-hydrochlorothiazide (HYZAAR) 100-25 MG tablet Take 1 tablet by mouth daily.  . Multiple Vitamins-Minerals (PRESERVISION AREDS 2 PO) Take by mouth 2 (two) times daily.  . multivitamin (THERAGRAN) per tablet Take 1 tablet by mouth daily.    Marland Kitchen omeprazole (PRILOSEC) 40 MG capsule 1 tab po twice per week.  . verapamil (CALAN-SR) 240 MG CR tablet TAKE 1 TABLET (240 MG TOTAL) BY MOUTH 2 (TWO) TIMES DAILY.  . furosemide (LASIX) 40 MG tablet Take 0.5 to 1 tablet by mouth every other day (Patient not taking: Reported on 12/29/2017)  . ipratropium (ATROVENT) 0.03 % nasal spray 1-2 sprays in each nostril about 10 minutes prior to each meal (qid) (Patient not taking: Reported on 12/29/2017)  . tobramycin (TOBREX) 0.3 % ophthalmic solution Place 1 drop into the right eye. Four times daily begin 1 day prior to treatment   No facility-administered encounter medications on file as of 01/30/2018.     Activities of Daily Living In your present state of health, do you have any difficulty performing the following activities: 01/30/2018  Hearing? Y  Vision? N  Difficulty concentrating or making decisions? Y  Walking or climbing stairs? Y  Comment difficulty walking straight   Dressing or bathing? N  Doing errands, shopping?  N  Preparing Food and eating ? Y  Using the Toilet? N  In the past six months, have you accidently leaked urine? N  Do you have problems with loss of bowel control? N  Managing your Medications? N  Managing your Finances? N  Housekeeping or managing your Housekeeping? Y  Some recent data might be hidden    Patient Care Team: Tammi Sou, MD as PCP - General Milus Banister, MD as Consulting Physician (Gastroenterology) Sherlynn Stalls, MD as Consulting Physician (Ophthalmology) Pablo Lawrence as Consulting Physician (Allergy and Immunology) Murvin Donning, MD (Dermatology) Pill, Annie Main  G, MD (Orthopedic Surgery)   Assessment:   This is a routine wellness examination for Glen Barnett.  Exercise Activities and Dietary recommendations Current Exercise Habits: The patient does not participate in regular exercise at present, Exercise limited by: orthopedic condition(s)   Diet (meal preparation, eat out, water intake, caffeinated beverages, dairy products, fruits and vegetables): Drinks juice and water.   Breakfast: skipping. Occasional coffee.  Lunch: [employer] Dinner: Meat and vegetables.   Goals    . Patient Stated     Maintain current health        Fall Risk Fall Risk  01/30/2018 01/07/2017 10/18/2015  Falls in the past year? No No No    Depression Screen PHQ 2/9 Scores 01/30/2018 12/29/2017 01/07/2017 10/18/2015  PHQ - 2 Score 0 0 0 0    Cognitive Function MMSE - Mini Mental State Exam 01/30/2018 01/07/2017  Orientation to time 5 5  Orientation to Place 5 5  Registration 3 3  Attention/ Calculation 5 5  Recall 1 2  Language- name 2 objects 2 2  Language- repeat 1 1  Language- follow 3 step command 3 3  Language- read & follow direction 1 1  Write a sentence 1 0  Copy design 1 1  Total score 28 28        Immunization History  Administered Date(s) Administered  . Influenza Split 07/04/2011, 08/11/2012, 08/21/2014  . Influenza, High Dose Seasonal PF  06/18/2016, 06/30/2017  . Influenza,inj,Quad PF,6+ Mos 06/07/2013  . Influenza-Unspecified 06/01/2015  . Pneumococcal Conjugate-13 09/08/2014  . Pneumococcal Polysaccharide-23 09/30/1996  . Td 10/01/2007  . Zoster 10/01/2007     Screening Tests Health Maintenance  Topic Date Due  . TETANUS/TDAP  01/31/2019 (Originally 09/30/2017)  . INFLUENZA VACCINE  04/30/2018  . PNA vac Low Risk Adult  Completed        Plan:    Continue to eat heart healthy diet (full of fruits, vegetables, whole grains, lean protein, water--limit salt, fat, and sugar intake) and increase physical activity as tolerated.  Continue doing brain stimulating activities (puzzles, reading, adult coloring books, staying active) to keep memory sharp.   I have personally reviewed and noted the following in the patient's chart:   . Medical and social history . Use of alcohol, tobacco or illicit drugs  . Current medications and supplements . Functional ability and status . Nutritional status . Physical activity . Advanced directives . List of other physicians . Hospitalizations, surgeries, and ER visits in previous 12 months . Vitals . Screenings to include cognitive, depression, and falls . Referrals and appointments  In addition, I have reviewed and discussed with patient certain preventive protocols, quality metrics, and best practice recommendations. A written personalized care plan for preventive services as well as general preventive health recommendations were provided to patient.     Gerilyn Nestle, RN  01/30/2018  PCP Notes: -Possibly moving to [place] or [place] (son possibly re-locating) -Declines Shingrix.  -F/U with PCP in October 2019.

## 2018-01-30 ENCOUNTER — Ambulatory Visit (INDEPENDENT_AMBULATORY_CARE_PROVIDER_SITE_OTHER): Payer: Medicare Other

## 2018-01-30 ENCOUNTER — Other Ambulatory Visit: Payer: Self-pay

## 2018-01-30 VITALS — BP 128/60 | HR 62 | Ht 69.0 in | Wt 207.2 lb

## 2018-01-30 DIAGNOSIS — Z0000 Encounter for general adult medical examination without abnormal findings: Secondary | ICD-10-CM | POA: Diagnosis not present

## 2018-01-30 NOTE — Patient Instructions (Addendum)
Continue to eat heart healthy diet (full of fruits, vegetables, whole grains, lean protein, water--limit salt, fat, and sugar intake) and increase physical activity as tolerated.  Continue doing brain stimulating activities (puzzles, reading, adult coloring books, staying active) to keep memory sharp.     Health Maintenance, Male A healthy lifestyle and preventive care is important for your health and wellness. Ask your health care provider about what schedule of regular examinations is right for you. What should I know about weight and diet? Eat a Healthy Diet  Eat plenty of vegetables, fruits, whole grains, low-fat dairy products, and lean protein.  Do not eat a lot of foods high in solid fats, added sugars, or salt.  Maintain a Healthy Weight Regular exercise can help you achieve or maintain a healthy weight. You should:  Do at least 150 minutes of exercise each week. The exercise should increase your heart rate and make you sweat (moderate-intensity exercise).  Do strength-training exercises at least twice a week.  Watch Your Levels of Cholesterol and Blood Lipids  Have your blood tested for lipids and cholesterol every 5 years starting at 82 years of age. If you are at high risk for heart disease, you should start having your blood tested when you are 82 years old. You may need to have your cholesterol levels checked more often if: ? Your lipid or cholesterol levels are high. ? You are older than 82 years of age. ? You are at high risk for heart disease.  What should I know about cancer screening? Many types of cancers can be detected early and may often be prevented. Lung Cancer  You should be screened every year for lung cancer if: ? You are a current smoker who has smoked for at least 30 years. ? You are a former smoker who has quit within the past 15 years.  Talk to your health care provider about your screening options, when you should start screening, and how often you  should be screened.  Colorectal Cancer  Routine colorectal cancer screening usually begins at 82 years of age and should be repeated every 5-10 years until you are 82 years old. You may need to be screened more often if early forms of precancerous polyps or small growths are found. Your health care provider may recommend screening at an earlier age if you have risk factors for colon cancer.  Your health care provider may recommend using home test kits to check for hidden blood in the stool.  A small camera at the end of a tube can be used to examine your colon (sigmoidoscopy or colonoscopy). This checks for the earliest forms of colorectal cancer.  Prostate and Testicular Cancer  Depending on your age and overall health, your health care provider may do certain tests to screen for prostate and testicular cancer.  Talk to your health care provider about any symptoms or concerns you have about testicular or prostate cancer.  Skin Cancer  Check your skin from head to toe regularly.  Tell your health care provider about any new moles or changes in moles, especially if: ? There is a change in a mole's size, shape, or color. ? You have a mole that is larger than a pencil eraser.  Always use sunscreen. Apply sunscreen liberally and repeat throughout the day.  Protect yourself by wearing long sleeves, pants, a wide-brimmed hat, and sunglasses when outside.  What should I know about heart disease, diabetes, and high blood pressure?  If you are  years of age, have your blood pressure checked every 3-5 years. If you are 40 years of age or older, have your blood pressure checked every year. You should have your blood pressure measured twice-once when you are at a hospital or clinic, and once when you are not at a hospital or clinic. Record the average of the two measurements. To check your blood pressure when you are not at a hospital or clinic, you can use: ? An automated blood pressure  machine at a pharmacy. ? A home blood pressure monitor.  Talk to your health care provider about your target blood pressure.  If you are between 45-79 years old, ask your health care provider if you should take aspirin to prevent heart disease.  Have regular diabetes screenings by checking your fasting blood sugar level. ? If you are at a normal weight and have a low risk for diabetes, have this test once every three years after the age of 45. ? If you are overweight and have a high risk for diabetes, consider being tested at a younger age or more often.  A one-time screening for abdominal aortic aneurysm (AAA) by ultrasound is recommended for men aged 65-75 years who are current or former smokers. What should I know about preventing infection? Hepatitis B If you have a higher risk for hepatitis B, you should be screened for this virus. Talk with your health care provider to find out if you are at risk for hepatitis B infection. Hepatitis C Blood testing is recommended for:  Everyone born from 1945 through 1965.  Anyone with known risk factors for hepatitis C.  Sexually Transmitted Diseases (STDs)  You should be screened each year for STDs including gonorrhea and chlamydia if: ? You are sexually active and are younger than 82 years of age. ? You are older than 82 years of age and your health care provider tells you that you are at risk for this type of infection. ? Your sexual activity has changed since you were last screened and you are at an increased risk for chlamydia or gonorrhea. Ask your health care provider if you are at risk.  Talk with your health care provider about whether you are at high risk of being infected with HIV. Your health care provider may recommend a prescription medicine to help prevent HIV infection.  What else can I do?  Schedule regular health, dental, and eye exams.  Stay current with your vaccines (immunizations).  Do not use any tobacco products,  such as cigarettes, chewing tobacco, and e-cigarettes. If you need help quitting, ask your health care provider.  Limit alcohol intake to no more than 2 drinks per day. One drink equals 12 ounces of beer, 5 ounces of wine, or 1 ounces of hard liquor.  Do not use street drugs.  Do not share needles.  Ask your health care provider for help if you need support or information about quitting drugs.  Tell your health care provider if you often feel depressed.  Tell your health care provider if you have ever been abused or do not feel safe at home. This information is not intended to replace advice given to you by your health care provider. Make sure you discuss any questions you have with your health care provider. Document Released: 03/14/2008 Document Revised: 05/15/2016 Document Reviewed: 06/20/2015 Elsevier Interactive Patient Education  2018 [employer]  

## 2018-02-03 NOTE — Progress Notes (Signed)
AWV reviewed and agree. Signed:  Crissie Sickles, MD           02/03/2018

## 2018-02-05 ENCOUNTER — Other Ambulatory Visit: Payer: Self-pay | Admitting: Family Medicine

## 2018-04-08 DIAGNOSIS — H35372 Puckering of macula, left eye: Secondary | ICD-10-CM | POA: Diagnosis not present

## 2018-04-08 DIAGNOSIS — H35412 Lattice degeneration of retina, left eye: Secondary | ICD-10-CM | POA: Diagnosis not present

## 2018-04-08 DIAGNOSIS — H353211 Exudative age-related macular degeneration, right eye, with active choroidal neovascularization: Secondary | ICD-10-CM | POA: Diagnosis not present

## 2018-04-08 DIAGNOSIS — H353121 Nonexudative age-related macular degeneration, left eye, early dry stage: Secondary | ICD-10-CM | POA: Diagnosis not present

## 2018-04-28 DIAGNOSIS — H3530 Unspecified macular degeneration: Secondary | ICD-10-CM | POA: Diagnosis not present

## 2018-04-28 DIAGNOSIS — H353230 Exudative age-related macular degeneration, bilateral, stage unspecified: Secondary | ICD-10-CM | POA: Diagnosis not present

## 2018-04-28 DIAGNOSIS — H52223 Regular astigmatism, bilateral: Secondary | ICD-10-CM | POA: Diagnosis not present

## 2018-05-04 ENCOUNTER — Other Ambulatory Visit: Payer: Self-pay

## 2018-05-04 DIAGNOSIS — I10 Essential (primary) hypertension: Secondary | ICD-10-CM

## 2018-05-04 MED ORDER — VERAPAMIL HCL ER 240 MG PO TBCR
EXTENDED_RELEASE_TABLET | ORAL | 1 refills | Status: DC
Start: 2018-05-04 — End: 2018-10-28

## 2018-05-22 ENCOUNTER — Other Ambulatory Visit: Payer: Self-pay | Admitting: Family Medicine

## 2018-06-15 DIAGNOSIS — H353122 Nonexudative age-related macular degeneration, left eye, intermediate dry stage: Secondary | ICD-10-CM | POA: Diagnosis not present

## 2018-06-15 DIAGNOSIS — H35412 Lattice degeneration of retina, left eye: Secondary | ICD-10-CM | POA: Diagnosis not present

## 2018-06-15 DIAGNOSIS — H353211 Exudative age-related macular degeneration, right eye, with active choroidal neovascularization: Secondary | ICD-10-CM | POA: Diagnosis not present

## 2018-06-15 DIAGNOSIS — H35372 Puckering of macula, left eye: Secondary | ICD-10-CM | POA: Diagnosis not present

## 2018-06-30 ENCOUNTER — Ambulatory Visit (INDEPENDENT_AMBULATORY_CARE_PROVIDER_SITE_OTHER): Payer: Medicare Other | Admitting: Family Medicine

## 2018-06-30 ENCOUNTER — Encounter: Payer: Self-pay | Admitting: Family Medicine

## 2018-06-30 VITALS — BP 110/61 | HR 61 | Temp 97.7°F | Resp 16 | Ht 69.0 in | Wt 199.4 lb

## 2018-06-30 DIAGNOSIS — I872 Venous insufficiency (chronic) (peripheral): Secondary | ICD-10-CM | POA: Diagnosis not present

## 2018-06-30 DIAGNOSIS — E663 Overweight: Secondary | ICD-10-CM | POA: Diagnosis not present

## 2018-06-30 DIAGNOSIS — I10 Essential (primary) hypertension: Secondary | ICD-10-CM

## 2018-06-30 DIAGNOSIS — Z23 Encounter for immunization: Secondary | ICD-10-CM | POA: Diagnosis not present

## 2018-06-30 DIAGNOSIS — N182 Chronic kidney disease, stage 2 (mild): Secondary | ICD-10-CM | POA: Diagnosis not present

## 2018-06-30 LAB — BASIC METABOLIC PANEL
BUN: 19 mg/dL (ref 6–23)
CHLORIDE: 102 meq/L (ref 96–112)
CO2: 29 meq/L (ref 19–32)
Calcium: 9.4 mg/dL (ref 8.4–10.5)
Creatinine, Ser: 1.03 mg/dL (ref 0.40–1.50)
GFR: 72.29 mL/min (ref >60.00)
GLUCOSE: 101 mg/dL — AB (ref 70–99)
POTASSIUM: 4 meq/L (ref 3.5–5.1)
SODIUM: 139 meq/L (ref 135–145)

## 2018-06-30 MED ORDER — ZOSTER VAC RECOMB ADJUVANTED 50 MCG/0.5ML IM SUSR: 0.5000 mL | Freq: Once | INTRAMUSCULAR | 1 refills | Status: AC

## 2018-06-30 NOTE — Progress Notes (Signed)
OFFICE VISIT  06/30/2018   CC:  Chief Complaint  Patient presents with  . Follow-up    RCI, pt is fasting.    HPI:    Patient is a 84 y.o. Caucasian male who presents for 6 mo f/u HTN, HLD, CRI with GFR in the 60s, + venous insufficiency edema of both LL's. He has been doing fine.  Still mainly sitting around eating, reading, watching TV.  No exercise. No LE swelling.  He continues to eat salt anytime he wants. Last visit we decided to d/c his statin b/c of unknown potential benefit in him + pt desire to reduce med intake. We also made a plan to ween down on his PPI some.  CRI: takes aleve q 2-3 mo for some LBP and hands arthritis.   Hydration: 2 cups coffee morning, which he is trying to cut back. Not drinking much water.   Is not eating quite as much as he used to.  He does not check his bp outside of our office at all.   Past Medical History:  Diagnosis Date  . Anemia 02/2012   Mild iron deficiency  . Arthritis   . Ataxia 2014/15   brain MRI 02/2014 showed encephalomalacia parietal and occip lobes c/w old infarcts, plus diffuse cerebral atrophy, o/w normal  . Cataract   . Chronic renal insufficiency, stage 2 (mild) 12/2016   GFR 60s-70s  . DDD (degenerative disc disease)    L-spine  . Dermatitis    ?Contact derm? per [facility] MD: Dr. Threasa Alpha (05/09/16): punch bx 06/18/16: chronic eczematous derm/contact derm, neg fungal stain.  Allergy (RAST) testing NEG.  . GERD (gastroesophageal reflux disease)   . Heme positive stool 02/2012   colonoscopy normal.  EGD showed small hiatal hernia with associated Cameron's erosions.  Hemoccults NEG x 3 09/2014.  Marland Kitchen Hyperlipidemia   . Hypertension   . Macular degeneration, age related    exudative R eye; nonexudative L eye.  Vitreous degeneration bilat.  . Obesity (BMI 30-39.9)   . Retinal hemorrhage 2016   w/ retinal detachment (right eye) : anticoagulants stopped due to this  . Venous insufficiency of both lower extremities      Past Surgical History:  Procedure Laterality Date  . APPENDECTOMY  1935  . cataract surg     Bilateral/ 08-13  . COLONOSCOPY  05/12/12   for iron def anemia--NORMAL RESULT  . ESOPHAGOGASTRODUODENOSCOPY  06/17/12   Small hiatal hernia with associated Cameron's erosions--likely source of his mild iron def anemia.  Marland Kitchen NASAL SINUS SURGERY     1998  . TONSILLECTOMY  1935    Outpatient Medications Prior to Visit  Medication Sig Dispense Refill  . Aflibercept (EYLEA IO) Inject into the eye. One injection into right eye every month    . cetirizine (ZYRTEC) 10 MG tablet Take 10 mg by mouth daily.  5  . Cholecalciferol (VITAMIN D3) 2000 UNITS TABS Take by mouth daily.      Marland Kitchen ipratropium (ATROVENT) 0.03 % nasal spray 1-2 sprays in each nostril about 10 minutes prior to each meal (qid) 30 mL 12  . losartan-hydrochlorothiazide (HYZAAR) 100-25 MG tablet TAKE 1 TABLET BY MOUTH EVERY DAY 90 tablet 0  . Multiple Vitamins-Minerals (PRESERVISION AREDS 2 PO) Take 1 tablet by mouth 2 (two) times daily.     . multivitamin (THERAGRAN) per tablet Take 1 tablet by mouth daily.      Marland Kitchen omeprazole (PRILOSEC) 40 MG capsule 1 tab po twice per week.  90 capsule 3  . tobramycin (TOBREX) 0.3 % ophthalmic solution Place 1 drop into the right eye. Four times daily begin 1 day prior to treatment  5  . verapamil (CALAN-SR) 240 MG CR tablet TAKE 1 TABLET (240 MG TOTAL) BY MOUTH 2 (TWO) TIMES DAILY. 180 tablet 1  . furosemide (LASIX) 40 MG tablet Take 0.5 to 1 tablet by mouth every other day (Patient not taking: Reported on 12/29/2017) 30 tablet 3   No facility-administered medications prior to visit.     No Known Allergies  ROS As per HPI  PE: Blood pressure 110/61, pulse 61, temperature 97.7 F (36.5 C), temperature source Oral, resp. rate 16, height 5\' 9"  (1.753 m), weight 199 lb 6 oz (90.4 kg), SpO2 96 %. Body mass index is 29.44 kg/m.  Gen: Alert, well appearing.  Patient is oriented to person, place, time,  and situation. AFFECT: pleasant, lucid thought and speech. CV: RRR with occ ectopy, 0-3/4 systolic murmur.  No diastolic murmur.  No r/g.   LUNGS: CTA bilat, nonlabored resps, good aeration in all lung fields. EXT: no clubbing or cyanosis.  no edema.    LABS:  Lab Results  Component Value Date   TSH 2.54 08/26/2016   Lab Results  Component Value Date   WBC 9.6 08/26/2016   HGB 14.1 08/26/2016   HCT 42.2 08/26/2016   MCV 99.4 08/26/2016   PLT 458.0 (H) 08/26/2016   Lab Results  Component Value Date   CREATININE 0.84 12/29/2017   BUN 22 12/29/2017   NA 138 12/29/2017   K 3.9 12/29/2017   CL 101 12/29/2017   CO2 29 12/29/2017   Lab Results  Component Value Date   ALT 19 08/26/2016   AST 19 08/26/2016   ALKPHOS 91 08/26/2016   BILITOT 0.7 08/26/2016   Lab Results  Component Value Date   CHOL 192 06/30/2017   Lab Results  Component Value Date   HDL 57.20 06/30/2017   Lab Results  Component Value Date   LDLCALC 104 (H) 06/30/2017   Lab Results  Component Value Date   TRIG 156.0 (H) 06/30/2017   Lab Results  Component Value Date   CHOLHDL 3 06/30/2017   Lab Results  Component Value Date   PSA 1.45 10/08/2010    IMPRESSION AND PLAN:  1) HTN: The current medical regimen is effective;  continue present plan and medications. BMET today.  2) CRI w/GFR 60s: avoids NSAIDs. Discussed need to hydrate better with clear fluids.  3) Chronic LE venous insufficiency---essentially no more problem with this at all. Has lasix on hand but doesn't need any at all lately.   Flu vaccine here today. Shingrix rx sent to patient's pharmacy.  An After Visit Summary was printed and given to the patient.  FOLLOW UP: Return in about 6 months (around 12/30/2018) for routine chronic illness f/u.  Signed:  Crissie Sickles, MD           06/30/2018

## 2018-08-04 DIAGNOSIS — D0439 Carcinoma in situ of skin of other parts of face: Secondary | ICD-10-CM | POA: Diagnosis not present

## 2018-08-04 DIAGNOSIS — D485 Neoplasm of uncertain behavior of skin: Secondary | ICD-10-CM | POA: Diagnosis not present

## 2018-08-04 DIAGNOSIS — L738 Other specified follicular disorders: Secondary | ICD-10-CM | POA: Diagnosis not present

## 2018-08-15 ENCOUNTER — Other Ambulatory Visit: Payer: Self-pay | Admitting: Family Medicine

## 2018-08-24 DIAGNOSIS — H353211 Exudative age-related macular degeneration, right eye, with active choroidal neovascularization: Secondary | ICD-10-CM | POA: Diagnosis not present

## 2018-08-24 DIAGNOSIS — H353121 Nonexudative age-related macular degeneration, left eye, early dry stage: Secondary | ICD-10-CM | POA: Diagnosis not present

## 2018-08-24 DIAGNOSIS — H35372 Puckering of macula, left eye: Secondary | ICD-10-CM | POA: Diagnosis not present

## 2018-08-24 DIAGNOSIS — H35431 Paving stone degeneration of retina, right eye: Secondary | ICD-10-CM | POA: Diagnosis not present

## 2018-10-06 ENCOUNTER — Other Ambulatory Visit: Payer: Self-pay | Admitting: *Deleted

## 2018-10-06 MED ORDER — OMEPRAZOLE 40 MG PO CPDR: DELAYED_RELEASE_CAPSULE | ORAL | 1 refills | Status: DC

## 2018-10-28 ENCOUNTER — Other Ambulatory Visit: Payer: Self-pay | Admitting: Family Medicine

## 2018-10-28 DIAGNOSIS — I10 Essential (primary) hypertension: Secondary | ICD-10-CM

## 2018-11-02 DIAGNOSIS — H353211 Exudative age-related macular degeneration, right eye, with active choroidal neovascularization: Secondary | ICD-10-CM | POA: Diagnosis not present

## 2018-11-02 DIAGNOSIS — H353122 Nonexudative age-related macular degeneration, left eye, intermediate dry stage: Secondary | ICD-10-CM | POA: Diagnosis not present

## 2018-11-02 DIAGNOSIS — H35372 Puckering of macula, left eye: Secondary | ICD-10-CM | POA: Diagnosis not present

## 2018-11-02 DIAGNOSIS — H35412 Lattice degeneration of retina, left eye: Secondary | ICD-10-CM | POA: Diagnosis not present

## 2018-12-14 ENCOUNTER — Other Ambulatory Visit: Payer: Self-pay | Admitting: Family Medicine

## 2018-12-20 ENCOUNTER — Other Ambulatory Visit: Payer: Self-pay | Admitting: Family Medicine

## 2018-12-30 ENCOUNTER — Encounter: Payer: Self-pay | Admitting: Family Medicine

## 2018-12-30 ENCOUNTER — Ambulatory Visit (INDEPENDENT_AMBULATORY_CARE_PROVIDER_SITE_OTHER): Payer: Medicare Other | Admitting: Family Medicine

## 2018-12-30 ENCOUNTER — Other Ambulatory Visit: Payer: Self-pay

## 2018-12-30 DIAGNOSIS — I10 Essential (primary) hypertension: Secondary | ICD-10-CM | POA: Diagnosis not present

## 2018-12-30 NOTE — Progress Notes (Signed)
Virtual Visit via Video Note  I connected with on 12/30/18 at 10:00 AM EDT by a telephone (virtual visit technology failed) and verified that I am speaking with the correct person using two identifiers.  Location patient: home Location provider:work or home office Persons participating in the virtual visit: patient, provider  I discussed the limitations of evaluation and management by telemedicine and the availability of in person appointments. The patient expressed understanding and agreed to proceed.   HPI: Six month f/u HTN, HLD, obesity, and CRI with GFR 60s. He stopped his omeprazole and is not having much problem with GERD/heartburn. No longer taking hctz anymore but is taking verapamil and  Losartan as rx'd. No home bp monitoring being done. Needs to cut back on salt intake a lot.  Legs swelling still but pt says not bad. No exercise.   ROS: See pertinent positives and negatives per HPI.  Past Medical History:  Diagnosis Date  . Anemia 02/2012   Mild iron deficiency  . Arthritis   . Ataxia 2014/15   brain MRI 02/2014 showed encephalomalacia parietal and occip lobes c/w old infarcts, plus diffuse cerebral atrophy, o/w normal  . Cataract   . Chronic renal insufficiency, stage 2 (mild) 12/2016   GFR 60s-70s  . DDD (degenerative disc disease)    L-spine  . Dermatitis    ?Contact derm? per [facility] MD: Dr. Threasa Alpha (05/09/16): punch bx 06/18/16: chronic eczematous derm/contact derm, neg fungal stain.  Allergy (RAST) testing NEG.  . GERD (gastroesophageal reflux disease)   . Heme positive stool 02/2012   colonoscopy normal.  EGD showed small hiatal hernia with associated Cameron's erosions.  Hemoccults NEG x 3 09/2014.  Marland Kitchen Hyperlipidemia   . Hypertension   . Macular degeneration, age related    exudative R eye; nonexudative L eye.  Vitreous degeneration bilat.  . Obesity (BMI 30-39.9)   . Retinal hemorrhage 2016   w/ retinal detachment (right eye) : anticoagulants stopped  due to this  . Venous insufficiency of both lower extremities     Past Surgical History:  Procedure Laterality Date  . APPENDECTOMY  1935  . cataract surg     Bilateral/ 08-13  . COLONOSCOPY  05/12/12   for iron def anemia--NORMAL RESULT  . ESOPHAGOGASTRODUODENOSCOPY  06/17/12   Small hiatal hernia with associated Cameron's erosions--likely source of his mild iron def anemia.  Marland Kitchen NASAL SINUS SURGERY     1998  . TONSILLECTOMY  1935    Family History  Problem Relation Age of Onset  . Liver cancer Mother     SOCIAL HX: lives alone and takes care of himself fine.   Current Outpatient Medications:  .  Aflibercept (EYLEA IO), Inject into the eye. One injection into right eye every month, Disp: , Rfl:  .  cetirizine (ZYRTEC) 10 MG tablet, Take 10 mg by mouth daily., Disp: , Rfl: 5 .  Cholecalciferol (VITAMIN D3) 2000 UNITS TABS, Take by mouth daily.  , Disp: , Rfl:  .  ipratropium (ATROVENT) 0.03 % nasal spray, 1-2 SPRAYS IN EACH NOSTRIL ABOUT 10 MINUTES PRIOR TO EACH MEAL ( 4 TIMES A DAY ), Disp: 30 mL, Rfl: 5 .  losartan (COZAAR) 100 MG tablet, TAKE 1 TABLET BY MOUTH EVERY DAY, Disp: 90 tablet, Rfl: 1 .  Multiple Vitamins-Minerals (PRESERVISION AREDS 2 PO), Take 1 tablet by mouth 2 (two) times daily. , Disp: , Rfl:  .  multivitamin (THERAGRAN) per tablet, Take 1 tablet by mouth daily.  ,  Disp: , Rfl:  .  omeprazole (PRILOSEC) 40 MG capsule, 1 tab po twice per week., Disp: 30 capsule, Rfl: 1 .  tobramycin (TOBREX) 0.3 % ophthalmic solution, Place 1 drop into the right eye. Four times daily begin 1 day prior to treatment, Disp: , Rfl: 5 .  verapamil (CALAN-SR) 240 MG CR tablet, TAKE 1 TABLET (240 MG TOTAL) BY MOUTH 2 (TWO) TIMES DAILY., Disp: 180 tablet, Rfl: 0  EXAM:  VITALS per patient if applicable:There were no vitals taken for this visit. Gen: Alert, NAD by voice/interaction on phone.  Patient is oriented to person, place, time, and situation. Lucid thought and speech.  LABS:  none today.    Chemistry      Component Value Date/Time   NA 139 06/30/2018 1107   K 4.0 06/30/2018 1107   CL 102 06/30/2018 1107   CO2 29 06/30/2018 1107   BUN 19 06/30/2018 1107   CREATININE 1.03 06/30/2018 1107      Component Value Date/Time   CALCIUM 9.4 06/30/2018 1107   ALKPHOS 91 08/26/2016 1131   AST 19 08/26/2016 1131   ALT 19 08/26/2016 1131   BILITOT 0.7 08/26/2016 1131     Lab Results  Component Value Date   CHOL 192 06/30/2017   HDL 57.20 06/30/2017   LDLCALC 104 (H) 06/30/2017   LDLDIRECT 112.5 09/11/2011   TRIG 156.0 (H) 06/30/2017   CHOLHDL 3 06/30/2017   Lab Results  Component Value Date   WBC 9.6 08/26/2016   HGB 14.1 08/26/2016   HCT 42.2 08/26/2016   MCV 99.4 08/26/2016   PLT 458.0 (H) 08/26/2016   No results found for: HGBA1C   ASSESSMENT AND PLAN:  Discussed the following assessment and plan:  1) HTN: control has been fine historically. Would like to know bp now that he has been off hctz, though. I asked him to check bp and hr at least 3 days a week for the next 2 weeks and we'll call and check in with him to see how they have been. No labs indicated today-->risk of coming to our lab (potential covid-19) exposure outweighs any need for lab work at this time. Will repeat labs at next f/u in 6 mo.   I discussed the assessment and treatment plan with the patient. The patient was provided an opportunity to ask questions and all were answered. The patient agreed with the plan and demonstrated an understanding of the instructions.   The patient was advised to call back or seek an in-person evaluation if the symptoms worsen or if the condition fails to improve as anticipated.  I provided 15 minutes of non-face-to-face time during this encounter.  F/u:  6 mo RCI + LABS at that time.  Signed:  Crissie Sickles, MD           12/30/2018

## 2019-01-04 DIAGNOSIS — H353211 Exudative age-related macular degeneration, right eye, with active choroidal neovascularization: Secondary | ICD-10-CM | POA: Diagnosis not present

## 2019-01-25 ENCOUNTER — Other Ambulatory Visit: Payer: Self-pay | Admitting: Family Medicine

## 2019-01-25 DIAGNOSIS — I10 Essential (primary) hypertension: Secondary | ICD-10-CM

## 2019-03-08 DIAGNOSIS — H353122 Nonexudative age-related macular degeneration, left eye, intermediate dry stage: Secondary | ICD-10-CM | POA: Diagnosis not present

## 2019-03-08 DIAGNOSIS — H353211 Exudative age-related macular degeneration, right eye, with active choroidal neovascularization: Secondary | ICD-10-CM | POA: Diagnosis not present

## 2019-04-08 DIAGNOSIS — D485 Neoplasm of uncertain behavior of skin: Secondary | ICD-10-CM | POA: Diagnosis not present

## 2019-04-08 DIAGNOSIS — Z86008 Personal history of in-situ neoplasm of other site: Secondary | ICD-10-CM | POA: Diagnosis not present

## 2019-04-08 DIAGNOSIS — D2339 Other benign neoplasm of skin of other parts of face: Secondary | ICD-10-CM | POA: Diagnosis not present

## 2019-04-08 DIAGNOSIS — C44629 Squamous cell carcinoma of skin of left upper limb, including shoulder: Secondary | ICD-10-CM | POA: Diagnosis not present

## 2019-05-17 DIAGNOSIS — H353122 Nonexudative age-related macular degeneration, left eye, intermediate dry stage: Secondary | ICD-10-CM | POA: Diagnosis not present

## 2019-05-17 DIAGNOSIS — H35372 Puckering of macula, left eye: Secondary | ICD-10-CM | POA: Diagnosis not present

## 2019-05-17 DIAGNOSIS — H353211 Exudative age-related macular degeneration, right eye, with active choroidal neovascularization: Secondary | ICD-10-CM | POA: Diagnosis not present

## 2019-05-17 DIAGNOSIS — H43813 Vitreous degeneration, bilateral: Secondary | ICD-10-CM | POA: Diagnosis not present

## 2019-06-02 ENCOUNTER — Other Ambulatory Visit: Payer: Self-pay | Admitting: Family Medicine

## 2019-06-02 NOTE — Telephone Encounter (Signed)
Per 12/30/2018 visit patient is not longer on this medication. Refill declined.

## 2019-06-03 ENCOUNTER — Other Ambulatory Visit: Payer: Self-pay | Admitting: Family Medicine

## 2019-06-04 NOTE — Telephone Encounter (Signed)
Medication is not is patients med list. I will call pharm at 9 when they open

## 2019-06-04 NOTE — Telephone Encounter (Signed)
Per my last office note from 12/2018, he is supposed to be taking verapamil and losartan (not hctz). I will deny rf of hctz. Pls call pt and make sure he is taking verapamil and losartan still every day.-thx

## 2019-06-04 NOTE — Telephone Encounter (Signed)
When I look into the patients chart, I do not seen this medication. I called the pharmacy and they stated the patient hasn't been on it since 04/2018. They were giving him losartan-hydrochlorothiazide. I do see that he is on plain Losartan. Do you want him on hydrochlorothiazide as well? I had kim look after me too and she didn't see it in the chart as current or past medication for the hydrochlorothiazide. Patient does have an appointment on 07/01/2019

## 2019-06-04 NOTE — Telephone Encounter (Signed)
Called patient and told him to not take the hydrochlorothiazide anymore. To just take the verapamil and the losartan. Patient verbalized understanding

## 2019-06-13 DIAGNOSIS — Z23 Encounter for immunization: Secondary | ICD-10-CM | POA: Diagnosis not present

## 2019-07-01 ENCOUNTER — Ambulatory Visit: Payer: Medicare Other | Admitting: Family Medicine

## 2019-07-08 ENCOUNTER — Encounter: Payer: Self-pay | Admitting: Family Medicine

## 2019-07-08 ENCOUNTER — Ambulatory Visit (INDEPENDENT_AMBULATORY_CARE_PROVIDER_SITE_OTHER): Payer: Medicare Other | Admitting: Family Medicine

## 2019-07-08 VITALS — BP 159/72 | HR 70 | Temp 98.7°F | Resp 16 | Ht 69.0 in | Wt 183.2 lb

## 2019-07-08 DIAGNOSIS — R634 Abnormal weight loss: Secondary | ICD-10-CM

## 2019-07-08 DIAGNOSIS — E7800 Pure hypercholesterolemia, unspecified: Secondary | ICD-10-CM

## 2019-07-08 DIAGNOSIS — R05 Cough: Secondary | ICD-10-CM

## 2019-07-08 DIAGNOSIS — I10 Essential (primary) hypertension: Secondary | ICD-10-CM

## 2019-07-08 DIAGNOSIS — Z862 Personal history of diseases of the blood and blood-forming organs and certain disorders involving the immune mechanism: Secondary | ICD-10-CM

## 2019-07-08 DIAGNOSIS — R600 Localized edema: Secondary | ICD-10-CM | POA: Diagnosis not present

## 2019-07-08 DIAGNOSIS — D509 Iron deficiency anemia, unspecified: Secondary | ICD-10-CM | POA: Diagnosis not present

## 2019-07-08 DIAGNOSIS — R053 Chronic cough: Secondary | ICD-10-CM

## 2019-07-08 LAB — POCT URINALYSIS DIPSTICK
Bilirubin, UA: NEGATIVE
Blood, UA: NEGATIVE
Glucose, UA: NEGATIVE
Ketones, UA: NEGATIVE
Leukocytes, UA: NEGATIVE
Nitrite, UA: NEGATIVE
Protein, UA: POSITIVE — AB
Spec Grav, UA: 1.015 (ref 1.010–1.025)
Urobilinogen, UA: 1 E.U./dL
pH, UA: 7 (ref 5.0–8.0)

## 2019-07-08 NOTE — Progress Notes (Signed)
OFFICE VISIT  07/08/2019   CC:  Chief Complaint  Patient presents with  . Follow-up    RCI, pt is not fasting   HPI:    Patient is a 85 y.o.  male who presents for 6 mo f/u HTN, HLD, and CRI with GFR 60s.  HTN:  HLD: not on statin-> he took pravastatin up until spring of 2019 but we d/c'd this b/c pt questioned whether it really does him any good at his age.  I told him we really don't have data on that particular question.  CRI with GFR around 60-70 ml/min:  He is having some abnormal weight loss.  He has a great appetite.  Typically eats 1 good meal a day. Three cans of campbells' soup per day.  Some fruit but not much junk food anymore. Drinks coffee, water, sprite.  Estimated 20-30 oz of water per day. Eats a lot of canned soup, has noted a bit more swelling in LL's R>L in the last week or so. No abd pain, no melena, no hematochezia.  Has several years of progressive nocturia, x 3 but only sleeps about 5 hrs per night.  He feels pretty rested. Occ dysuria, nothing consistent.  No gross hematuria.  No signif urgency.  No daytime frequency. He empties bladder fine.  He does have a cough x "years", nonproductive.  Finds it hard to describe.  Even non-strenuous  Activity causes mild DOE but he recovers quickly with sitting/resting.  No CP. No nausea.    ROS:  no wheezing, no dizziness, no HAs, no rashes, no melena/hematochezia.  No polyuria or polydipsia.  No myalgias. has chronic knee arthralgias.  Mild disequilibrium syndrome since having a remote CVA, gradually getting worse.  "Always hungry".  Chronic runny nose.  Past Medical History:  Diagnosis Date  . Anemia 02/2012   Mild iron deficiency  . Arthritis   . Ataxia 2014/15   brain MRI 02/2014 showed encephalomalacia parietal and occip lobes c/w old infarcts, plus diffuse cerebral atrophy, o/w normal  . Cataract   . Chronic renal insufficiency, stage 2 (mild) 12/2016   GFR 60s-70s  . DDD (degenerative disc disease)    L-spine  . Dermatitis    ?Contact derm? per [facility] MD: Dr. Threasa Alpha (05/09/16): punch bx 06/18/16: chronic eczematous derm/contact derm, neg fungal stain.  Allergy (RAST) testing NEG.  . GERD (gastroesophageal reflux disease)   . Heme positive stool 02/2012   colonoscopy normal.  EGD showed small hiatal hernia with associated Cameron's erosions.  Hemoccults NEG x 3 09/2014.  Marland Kitchen Hyperlipidemia   . Hypertension   . Macular degeneration, age related    exudative R eye; nonexudative L eye.  Vitreous degeneration bilat.  . Obesity (BMI 30-39.9)   . Retinal hemorrhage 2016   w/ retinal detachment (right eye) : anticoagulants stopped due to this  . Venous insufficiency of both lower extremities     Past Surgical History:  Procedure Laterality Date  . APPENDECTOMY  1935  . cataract surg     Bilateral/ 08-13  . COLONOSCOPY  05/12/12   for iron def anemia--NORMAL RESULT  . ESOPHAGOGASTRODUODENOSCOPY  06/17/12   Small hiatal hernia with associated Cameron's erosions--likely source of his mild iron def anemia.  Marland Kitchen NASAL SINUS SURGERY     1998  . TONSILLECTOMY  1935   Social History   Socioeconomic History  . Marital status: Widowed    Spouse name: Not on file  . Number of children: Not on file  .  Years of education: Not on file  . Highest education level: Not on file  Occupational History  . Not on file  Social Needs  . Financial resource strain: Not on file  . Food insecurity    Worry: Not on file    Inability: Not on file  . Transportation needs    Medical: Not on file    Non-medical: Not on file  Tobacco Use  . Smoking status: Never Smoker  . Smokeless tobacco: Never Used  Substance and Sexual Activity  . Alcohol use: No  . Drug use: No  . Sexual activity: Not on file  Lifestyle  . Physical activity    Days per week: Not on file    Minutes per session: Not on file  . Stress: Not on file  Relationships  . Social Herbalist on phone: Not on file    Gets  together: Not on file    Attends religious service: Not on file    Active member of club or organization: Not on file    Attends meetings of clubs or organizations: Not on file    Relationship status: Not on file  Other Topics Concern  . Not on file  Social History Narrative   Widower.  Wife died 2011-09-11 of brain cancer.   He relocated from [place] to live near son.   NO exercise.  No T/A/Ds.   Retired Chief Financial Officer.     Outpatient Medications Prior to Visit  Medication Sig Dispense Refill  . Aflibercept (EYLEA IO) Inject into the eye. One injection into right eye every month    . Cholecalciferol (VITAMIN D3) 2000 UNITS TABS Take by mouth daily.      Marland Kitchen ipratropium (ATROVENT) 0.03 % nasal spray 1-2 SPRAYS IN EACH NOSTRIL ABOUT 10 MINUTES PRIOR TO EACH MEAL ( 4 TIMES A DAY ) 30 mL 5  . losartan (COZAAR) 100 MG tablet TAKE 1 TABLET BY MOUTH EVERY DAY 90 tablet 1  . Multiple Vitamins-Minerals (PRESERVISION AREDS 2 PO) Take 1 tablet by mouth 2 (two) times daily.     . multivitamin (THERAGRAN) per tablet Take 1 tablet by mouth daily.      Marland Kitchen tobramycin (TOBREX) 0.3 % ophthalmic solution Place 1 drop into the right eye. Four times daily begin 1 day prior to treatment  5  . verapamil (CALAN-SR) 240 MG CR tablet TAKE 1 TABLET (240 MG TOTAL) BY MOUTH 2 (TWO) TIMES DAILY. 180 tablet 0  . cetirizine (ZYRTEC) 10 MG tablet Take 10 mg by mouth daily.  5  . omeprazole (PRILOSEC) 40 MG capsule 1 tab po twice per week. (Patient not taking: Reported on 07/08/2019) 30 capsule 1   No facility-administered medications prior to visit.     No Known Allergies  ROS As per HPI  PE: Blood pressure (!) 159/72, pulse 70, temperature 98.7 F (37.1 C), temperature source Temporal, resp. rate 16, height 5\' 9"  (1.753 m), weight 183 lb 3.2 oz (83.1 kg), SpO2 96 %. Body mass index is 27.05 kg/m.  Gen: Alert, well appearing.  Patient is oriented to person, place, time, and situation. AFFECT: pleasant, lucid  thought and speech. [id number]: no injection, icteris, swelling, or exudate.  EOMI, PERRLA. Mouth: lips without lesion/swelling.  Oral mucosa pink and moist. Oropharynx without erythema, exudate, or swelling. SMall amount of whitish PND noted.  Neck - No masses or thyromegaly or limitation in range of motion CV: RRR with occ ectopy, [id number] systolic murmur, S1 obscured, S2  distinct, no diastolic murmur, no r/g.   LUNGS: CTA bilat, nonlabored resps, good aeration in all lung fields. No axillary LAD/mass. ABD: soft, NT, ND, BS normal.  No hepatospenomegaly or mass.  No bruits. EXT: no clubbing or cyanosis.  Trace bilat LL pitting edema with mild/mod hyperpigmentation and fibrotic skin changes of chronic venous stasis dz. SKIN: no rash or suspicious lesion. No pallor or jaundice. Neuro: CN 2-12 intact bilaterally, strength 5/5 in proximal and distal upper extremities and lower extremities bilaterally.   No tremor.  No ataxia.  Upper extremity and lower extremity DTRs symmetric.  No pronator drift. Musculoskeletal: no joint swelling, erythema, warmth, or tenderness.  ROM of all joints intact.    LABS:   Lab Results  Component Value Date   TSH 2.54 08/26/2016      Chemistry      Component Value Date/Time   NA 139 06/30/2018 1107   K 4.0 06/30/2018 1107   CL 102 06/30/2018 1107   CO2 29 06/30/2018 1107   BUN 19 06/30/2018 1107   CREATININE 1.03 06/30/2018 1107      Component Value Date/Time   CALCIUM 9.4 06/30/2018 1107   ALKPHOS 91 08/26/2016 1131   AST 19 08/26/2016 1131   ALT 19 08/26/2016 1131   BILITOT 0.7 08/26/2016 1131     Lab Results  Component Value Date   WBC 9.6 08/26/2016   HGB 14.1 08/26/2016   HCT 42.2 08/26/2016   MCV 99.4 08/26/2016   PLT 458.0 (H) 08/26/2016   Lab Results  Component Value Date   IRON 120 08/26/2016   IRON 103 08/26/2016   TIBC 296 08/26/2016   FERRITIN 121.6 08/26/2016   Lab Results  Component Value Date   CHOL 192 06/30/2017   HDL  57.20 06/30/2017   LDLCALC 104 (H) 06/30/2017   LDLDIRECT 112.5 09/11/2011   TRIG 156.0 (H) 06/30/2017   CHOLHDL 3 06/30/2017   POC CC Dipstick UA today: normal  IMPRESSION AND PLAN:  1) HTN: unclear control.  Moderately elevated here today.  I was focused so much on pt's wt loss that I lost sight of this issue. Lytes/cr today.  2) HLD: not on a statin anymore by choice.  3) CRI II/III: avoids NSAIDs.  Hydrates fairly well. Lytes/cr today.  4) Abnormal wt loss: history of being in class I obesity range, but in the last 18 months he has lost 23 lbs. Per his report he has had this wt loss without making any appreciable decrease in caloric intake and he has never exercised at all. Fortunately, he feels completely well. He did have some excess fluid and marked LL edema at one point and has lost this, so some of his wt loss is fluid weight (approx 4-5 lbs). Will do general w/u at this time: CXR, UA (normal here today), CMET, CBC w/diff, CRP, TSH, PSA, Hba1c.  If w/u neg, we'll see how his weght does over the next 1 mo.  If continues to fall abnormally then will proceed with CT of C/A/P.  5) DOE: I think he is very deconditioned, but with his heart murmur sounding a bit louder than the past I do wonder if he is having some symptoms of CHF (his murmur may be from valvular dysfunction that is CONTRIBUTING TO or A RESULT of CHF). No cardiac w/u at this time b/c he is w/out CP and there doesn't seem to be any progression of his symptoms.  Will keep this in mind in near future,  though.  An After Visit Summary was printed and given to the patient.  FOLLOW UP: Return in about 4 weeks (around 08/05/2019) for f/u wt loss and shortness of breath.  Signed:  Crissie Sickles, MD           07/08/2019

## 2019-07-09 ENCOUNTER — Encounter: Payer: Self-pay | Admitting: Family Medicine

## 2019-07-09 ENCOUNTER — Other Ambulatory Visit (INDEPENDENT_AMBULATORY_CARE_PROVIDER_SITE_OTHER): Payer: Medicare Other

## 2019-07-09 ENCOUNTER — Other Ambulatory Visit: Payer: Self-pay | Admitting: Family Medicine

## 2019-07-09 ENCOUNTER — Telehealth: Payer: Self-pay

## 2019-07-09 DIAGNOSIS — D509 Iron deficiency anemia, unspecified: Secondary | ICD-10-CM

## 2019-07-09 LAB — CBC WITH DIFFERENTIAL/PLATELET
Basophils Absolute: 0.1 10*3/uL (ref 0.0–0.1)
Basophils Relative: 2 % (ref 0.0–3.0)
Eosinophils Absolute: 0.1 10*3/uL (ref 0.0–0.7)
Eosinophils Relative: 1.9 % (ref 0.0–5.0)
HCT: 25.6 % — ABNORMAL LOW (ref 39.0–52.0)
Hemoglobin: 7.5 g/dL — CL (ref 13.0–17.0)
Lymphocytes Relative: 32.5 % (ref 12.0–46.0)
Lymphs Abs: 1.8 10*3/uL (ref 0.7–4.0)
MCHC: 29.5 g/dL — ABNORMAL LOW (ref 30.0–36.0)
MCV: 73.5 fl — ABNORMAL LOW (ref 78.0–100.0)
Monocytes Absolute: 0.5 10*3/uL (ref 0.1–1.0)
Monocytes Relative: 8.5 % (ref 3.0–12.0)
Neutro Abs: 3 10*3/uL (ref 1.4–7.7)
Neutrophils Relative %: 55.1 % (ref 43.0–77.0)
Platelets: 513 10*3/uL — ABNORMAL HIGH (ref 150.0–400.0)
RBC: 3.48 Mil/uL — ABNORMAL LOW (ref 4.22–5.81)
RDW: 24.7 % — ABNORMAL HIGH (ref 11.5–15.5)
WBC: 5.4 10*3/uL (ref 4.0–10.5)

## 2019-07-09 LAB — COMPREHENSIVE METABOLIC PANEL
ALT: 9 U/L (ref 0–53)
AST: 11 U/L (ref 0–37)
Albumin: 3.8 g/dL (ref 3.5–5.2)
Alkaline Phosphatase: 80 U/L (ref 39–117)
BUN: 17 mg/dL (ref 6–23)
CO2: 27 mEq/L (ref 19–32)
Calcium: 9.1 mg/dL (ref 8.4–10.5)
Chloride: 104 mEq/L (ref 96–112)
Creatinine, Ser: 0.91 mg/dL (ref 0.40–1.50)
GFR: 78.28 mL/min (ref >60.00)
Glucose, Bld: 100 mg/dL — ABNORMAL HIGH (ref 70–99)
Potassium: 4.2 mEq/L (ref 3.5–5.1)
Sodium: 138 mEq/L (ref 135–145)
Total Bilirubin: 0.3 mg/dL (ref 0.2–1.2)
Total Protein: 6.2 g/dL (ref 6.0–8.3)

## 2019-07-09 LAB — TSH: TSH: 2.44 u[IU]/mL (ref 0.35–4.50)

## 2019-07-09 LAB — PSA, MEDICARE: PSA: 3.11 ng/ml (ref 0.10–4.00)

## 2019-07-09 LAB — IRON: Iron: 6 ug/dL — ABNORMAL LOW (ref 42–165)

## 2019-07-09 LAB — HEMOGLOBIN A1C: Hgb A1c MFr Bld: 6.1 % (ref 4.6–6.5)

## 2019-07-09 LAB — C-REACTIVE PROTEIN: CRP: <1 mg/dL (ref 0.5–20.0)

## 2019-07-09 NOTE — Addendum Note (Signed)
Addended by: Marlene Bast A on: 07/09/2019 04:58 PM   Modules accepted: Orders

## 2019-07-09 NOTE — Telephone Encounter (Signed)
CRITICAL VALUE STICKER  CRITICAL VALUE: hemoglobin 7.5  RECEIVER (on-site recipient of call): Deveron Furlong, [place] NOTIFIED: 07/09/19, 3:55pm  MESSENGER (representative from lab): Santiago Glad  MD NOTIFIED: [place]: 3:55pm  RESPONSE: Please advise, thanks.

## 2019-07-09 NOTE — Telephone Encounter (Signed)
Noted. Result note has been sent to [facility] with instructions.

## 2019-07-12 ENCOUNTER — Encounter: Payer: Self-pay | Admitting: Family Medicine

## 2019-07-12 LAB — FERRITIN: Ferritin: 5.4 ng/mL — ABNORMAL LOW (ref 22.0–322.0)

## 2019-07-13 ENCOUNTER — Ambulatory Visit (INDEPENDENT_AMBULATORY_CARE_PROVIDER_SITE_OTHER): Payer: Medicare Other

## 2019-07-13 ENCOUNTER — Other Ambulatory Visit: Payer: Self-pay

## 2019-07-13 ENCOUNTER — Other Ambulatory Visit: Payer: Medicare Other

## 2019-07-13 DIAGNOSIS — R053 Chronic cough: Secondary | ICD-10-CM

## 2019-07-13 DIAGNOSIS — R634 Abnormal weight loss: Secondary | ICD-10-CM | POA: Diagnosis not present

## 2019-07-13 DIAGNOSIS — R05 Cough: Secondary | ICD-10-CM | POA: Diagnosis not present

## 2019-07-14 ENCOUNTER — Telehealth: Payer: Self-pay | Admitting: Family Medicine

## 2019-07-14 NOTE — Telephone Encounter (Signed)
Pt called stating he thought he received a call from this office yesterday around 3 pm telling him what he will need to do next following his lab & x-ray appt at [facility].  I did not see any documentation in pt's chart where we called him, but wanted to check to see if maybe someone had reached out to him??  Please contact pt one way or another as he is concerned.

## 2019-07-14 NOTE — Telephone Encounter (Signed)
Pt was advised he was contacted yesterday regarding iron level results and to complete hemoccult cards.

## 2019-07-15 ENCOUNTER — Other Ambulatory Visit: Payer: Self-pay | Admitting: Family Medicine

## 2019-07-15 ENCOUNTER — Encounter: Payer: Self-pay | Admitting: Gastroenterology

## 2019-07-15 DIAGNOSIS — D500 Iron deficiency anemia secondary to blood loss (chronic): Secondary | ICD-10-CM

## 2019-07-20 ENCOUNTER — Other Ambulatory Visit: Payer: Medicare Other

## 2019-07-20 ENCOUNTER — Telehealth: Payer: Self-pay

## 2019-07-20 ENCOUNTER — Other Ambulatory Visit: Payer: Self-pay | Admitting: Family Medicine

## 2019-07-20 DIAGNOSIS — D509 Iron deficiency anemia, unspecified: Secondary | ICD-10-CM

## 2019-07-20 LAB — HEMOCCULT SLIDES (X 3 CARDS)
Fecal Occult Blood: NEGATIVE
OCCULT 1: POSITIVE — AB
OCCULT 2: POSITIVE — AB
OCCULT 3: NEGATIVE
OCCULT 4: POSITIVE — AB
OCCULT 5: NEGATIVE

## 2019-07-20 NOTE — Telephone Encounter (Signed)
Reviewed patient's chart.  By PCPs last note, he has been referred back to his gastroenterologist for this condition.  Will forward to PCP to address on his return tomorrow.

## 2019-07-20 NOTE — Telephone Encounter (Signed)
Yes--pt has appt with GI tomorrow.

## 2019-07-20 NOTE — Telephone Encounter (Signed)
CRITICAL VALUE STICKER  CRITICAL VALUE: Hemoccult Cards(X3 cards), 3 positive and 3 negative  RECEIVER (on-site recipient of call): Trey Sailors, [place] NOTIFIED: 07/20/19, 4:15pm  MESSENGER (representative from lab): Venetia Constable lab  MD NOTIFIED: [place]: 4:15pm  RESPONSE:    Please review in PCP absence.

## 2019-07-20 NOTE — Telephone Encounter (Signed)
Opened in error

## 2019-07-21 ENCOUNTER — Telehealth: Payer: Self-pay

## 2019-07-21 ENCOUNTER — Encounter: Payer: Self-pay | Admitting: Gastroenterology

## 2019-07-21 ENCOUNTER — Ambulatory Visit (INDEPENDENT_AMBULATORY_CARE_PROVIDER_SITE_OTHER): Payer: Medicare Other | Admitting: Gastroenterology

## 2019-07-21 ENCOUNTER — Other Ambulatory Visit: Payer: Self-pay

## 2019-07-21 VITALS — BP 168/62 | HR 86 | Temp 99.0°F | Ht 70.0 in | Wt 184.5 lb

## 2019-07-21 DIAGNOSIS — R195 Other fecal abnormalities: Secondary | ICD-10-CM | POA: Diagnosis not present

## 2019-07-21 DIAGNOSIS — D509 Iron deficiency anemia, unspecified: Secondary | ICD-10-CM | POA: Diagnosis not present

## 2019-07-21 NOTE — Telephone Encounter (Signed)
-----   Message from Glen Barnett, Glen Barnett sent at 07/21/2019  4:17 PM EDT ----- Regarding: Feraheme x2 [name],   This pt needs Feraheme x2  dx: IDA per Christianne Dolin , he is Dr. Ardis Hughs pt's.   Thank you  soooooooooo much!   [place]

## 2019-07-21 NOTE — Progress Notes (Signed)
07/21/2019 Glen Barnett [id number] 1926-11-18   HISTORY OF PRESENT ILLNESS:  This is an 85 year old male who is a patient of Dr. Ardis Hughs who we are seeing today for iron deficiency anemia and heme positive stools.  Hemoglobin 7.5 g with last for comparison being 3 years ago at which time was normal at 14 g.  Iron levels very low.  He reports no overt bleeding, but was heme positive.  Was evaluated 7 years ago with endoscopy and colonoscopy for the same diagnoses at which time he was found to have Cameron's erosions/lesions that were thought to be the cause of his issues at that time.  We discussed repeating those procedures, but patient very skeptical, asking "are these procedures absolutely necessary?"  He is currently only on PPI twice weekly.  He denies any abdominal pain.  Just of note, patient seemed confused at times.  Other times he seemed to grasp our conversation well.  Not sure he completely understood the diagnoses and what exactly we were addressing today throughout the entire visit.  Unfortunately he was here alone today.   Past Medical History:  Diagnosis Date  . Arthritis   . Ataxia 2014/15   brain MRI 02/2014 showed encephalomalacia parietal and occip lobes c/w old infarcts, plus diffuse cerebral atrophy, o/w normal  . Cataract   . Chronic renal insufficiency, stage 2 (mild) 12/2016   GFR 60s-70s  . DDD (degenerative disc disease)    L-spine  . Dermatitis    ?Contact derm? per [facility] D[facility] MD: Dr. Threasa Alpha (05/09/16): punch bx 06/18/16: chronic eczematous derm/contact derm, neg fungal stain.  Allergy (RAST) testing NEG.  . GERD (gastroesophageal reflux disease)   . Heme positive stool 02/2012   colonoscopy normal.  EGD showed small hiatal hernia with associated Cameron's erosions.  Hemoccults NEG x 3 09/2014.  Marland Kitchen Hyperlipidemia   . Hypertension   . Iron deficiency anemia 02/2012; 07/2019   2013 Mild iron deficiency->small upper GI erosions, colonoscopy  normal.  07/2019->Hb 7.5  . Macular degeneration, age related    exudative R eye; nonexudative L eye.  Vitreous degeneration bilat.  . Obesity (BMI 30-39.9)   . Retinal hemorrhage 2016   w/ retinal detachment (right eye) : anticoagulants stopped due to this  . Venous insufficiency of both lower extremities    Past Surgical History:  Procedure Laterality Date  . APPENDECTOMY  1935  . cataract surg     Bilateral/ 08-13  . COLONOSCOPY  05/12/12   for iron def anemia--NORMAL RESULT  . ESOPHAGOGASTRODUODENOSCOPY  06/17/12   Small hiatal hernia with associated Cameron's erosions--likely source of his mild iron def anemia.  Marland Kitchen NASAL SINUS SURGERY     1998  . TONSILLECTOMY  1935    reports that he has never smoked. He has never used smokeless tobacco. He reports that he does not drink alcohol or use drugs. family history includes Liver cancer in his mother. No Known Allergies    Outpatient Encounter Medications as of 07/21/2019  Medication Sig  . Aflibercept (EYLEA IO) Inject into the eye. One injection into right eye every month  . cetirizine (ZYRTEC) 10 MG tablet Take 10 mg by mouth daily.  . Cholecalciferol (VITAMIN D3) 2000 UNITS TABS Take by mouth daily.    Marland Kitchen ipratropium (ATROVENT) 0.03 % nasal spray 1-2 SPRAYS IN EACH NOSTRIL ABOUT 10 MINUTES PRIOR TO EACH MEAL ( 4 TIMES A DAY )  . losartan (COZAAR) 100 MG tablet TAKE 1 TABLET BY  MOUTH EVERY DAY  . Multiple Vitamins-Minerals (PRESERVISION AREDS 2 PO) Take 1 tablet by mouth 2 (two) times daily.   . multivitamin (THERAGRAN) per tablet Take 1 tablet by mouth daily.    Marland Kitchen omeprazole (PRILOSEC) 40 MG capsule 1 tab po twice per week.  . tobramycin (TOBREX) 0.3 % ophthalmic solution Place 1 drop into the right eye. Four times daily begin 1 day prior to treatment  . verapamil (CALAN-SR) 240 MG CR tablet TAKE 1 TABLET (240 MG TOTAL) BY MOUTH 2 (TWO) TIMES DAILY.   No facility-administered encounter medications on file as of 07/21/2019.       REVIEW OF SYSTEMS  : All other systems reviewed and negative except where noted in the History of Present Illness.   PHYSICAL EXAM: BP (!) 168/62 (BP Location: Left Arm, Patient Position: Sitting, Cuff Size: Normal)   Pulse 86   Temp 99 F (37.2 C) (Oral)   Ht 5\' 10"  (1.778 m)   Wt 184 lb 8 oz (83.7 kg)   BMI 26.47 kg/m  General: Well developed white male in no acute distress Head: Normocephalic and atraumatic Eyes:  Sclerae anicteric, conjunctiva pink. Ears: Normal auditory acuity Lungs: Clear throughout to auscultation; no increased WOB. Heart: Regular rate and rhythm; murmur noted. Abdomen: Soft, non-distended.  BS present.  Non-tender. Musculoskeletal: Symmetrical with no gross deformities  Skin: No lesions on visible extremities Extremities: No edema  Neurological: Alert oriented x 4, grossly non-focal Psychological:  Alert and cooperative. Normal mood and affect  ASSESSMENT AND PLAN: 85 year old male with iron deficiency anemia and heme positive stools.  Hemoglobin 7.5 g with last for comparison being 3 years ago at which time was normal at 14 g.  He reports no overt bleeding, but was heme positive.  Was evaluated 7 years ago with endoscopy and colonoscopy for the same diagnoses at which time he was found to have Cameron's erosions/lesions that were thought to be the cause of his issues at that time.  That certainly is the most likely cause for this recurrence.  We discussed repeating those procedures, but patient very skeptical.  We also discussed more conservative approach with IV iron, daily PPI therapy, and monitoring of hemoglobin.  Patient elected for this conservative approach, which I think is appropriate.  We will schedule for 2 doses of IV iron of asked him to take his PPI daily.  Hemoglobin can be monitored every couple of months by his PCP.   CC:  [place]owen, Adrian Blackwater, MD

## 2019-07-21 NOTE — Patient Instructions (Signed)
If you are age 83 or older, your body mass index should be between 23-30. Your Body mass index is 26.47 kg/m. If this is out of the aforementioned range listed, please consider follow up with your Primary Care Provider.  If you are age 15 or younger, your body mass index should be between 19-25. Your Body mass index is 26.47 kg/m. If this is out of the aformentioned range listed, please consider follow up with your Primary Care Provider.    Your provider has recommended that you receive Feraheme IV iron infusion x2. Patty - RN will contact you to schedule these iron infusions.   Thank you for choosing me and [facility].  Janett Billow Zehr-PA

## 2019-07-21 NOTE — Telephone Encounter (Signed)
Pt needs to be set up for 2 doses of ferriheme.  Will call in the morning.  Referral has been sent to [facility].

## 2019-07-22 NOTE — Progress Notes (Signed)
I agree with the above note, plan. Oral iron supplement once daily going forward is probably a good idea as well.

## 2019-07-22 NOTE — Telephone Encounter (Signed)
[place], Amy Danae Chen, RN        [name],  Patient has Medicare with Supplement. No precert needed for Feraheme.  You can schedule him.  Thanks,  Amy

## 2019-07-22 NOTE — Telephone Encounter (Signed)
appt made at the pt care center for 10/28 at 930 am and 11/11 at 9 am.  [place] The patient has been notified of this information and all questions answered.

## 2019-07-25 ENCOUNTER — Encounter: Payer: Self-pay | Admitting: Family Medicine

## 2019-07-26 ENCOUNTER — Other Ambulatory Visit (HOSPITAL_COMMUNITY)
Admission: RE | Admit: 2019-07-26 | Discharge: 2019-07-26 | Disposition: A | Discharge status: Home / Self Care | Payer: Medicare Other | Source: Ambulatory Visit | Attending: Gastroenterology | Admitting: Gastroenterology

## 2019-07-26 DIAGNOSIS — Z01812 Encounter for preprocedural laboratory examination: Secondary | ICD-10-CM | POA: Insufficient documentation

## 2019-07-26 DIAGNOSIS — Z20828 Contact with and (suspected) exposure to other viral communicable diseases: Secondary | ICD-10-CM | POA: Diagnosis not present

## 2019-07-26 LAB — SARS CORONAVIRUS 2 (TAT 6-24 HRS): SARS Coronavirus 2: NEGATIVE

## 2019-07-28 ENCOUNTER — Other Ambulatory Visit: Payer: Self-pay

## 2019-07-28 ENCOUNTER — Ambulatory Visit (HOSPITAL_COMMUNITY)
Admission: RE | Admit: 2019-07-28 | Discharge: 2019-07-28 | Disposition: A | Discharge status: Home / Self Care | Payer: Medicare Other | Source: Ambulatory Visit | Attending: Internal Medicine | Admitting: Internal Medicine

## 2019-07-28 DIAGNOSIS — D509 Iron deficiency anemia, unspecified: Secondary | ICD-10-CM

## 2019-07-28 MED ORDER — SODIUM CHLORIDE 0.9 % IV SOLN
INTRAVENOUS | Status: DC | PRN
  Administered 2019-07-28: 250 mL via INTRAVENOUS

## 2019-07-28 MED ORDER — SODIUM CHLORIDE 0.9 % IV SOLN
510.0000 mg | INTRAVENOUS | Status: DC
  Administered 2019-07-28: 510 mg via INTRAVENOUS
  Filled 2019-07-28: qty 17

## 2019-07-28 NOTE — Discharge Instructions (Signed)

## 2019-07-28 NOTE — Progress Notes (Signed)
Patient ID: Glen Barnett, male   DOB: 1926/11/01, 85 y.o.   MRN: [id number]                     Patient Care Center Note   Diagnosis: Iron Deficiency Anemia   Provider: Jethro Bastos, PA-C   Procedure: IV Feraheme   Note: Patient received IV Fereheme via PIV. Tolerated well. Observation 30 min post infusion . Vitals stable. Discharge instructions given.  Alert, oriented and ambulatory at discharge.   Otho Bellows, RN

## 2019-07-29 DIAGNOSIS — H43813 Vitreous degeneration, bilateral: Secondary | ICD-10-CM | POA: Diagnosis not present

## 2019-07-29 DIAGNOSIS — H35372 Puckering of macula, left eye: Secondary | ICD-10-CM | POA: Diagnosis not present

## 2019-07-29 DIAGNOSIS — H353121 Nonexudative age-related macular degeneration, left eye, early dry stage: Secondary | ICD-10-CM | POA: Diagnosis not present

## 2019-07-29 DIAGNOSIS — H353211 Exudative age-related macular degeneration, right eye, with active choroidal neovascularization: Secondary | ICD-10-CM | POA: Diagnosis not present

## 2019-08-03 ENCOUNTER — Telehealth: Payer: Self-pay

## 2019-08-03 NOTE — Telephone Encounter (Signed)
Recently received fax from [facility], [facility] for one of pt's medications, Omeprazole. Pharmacy comments: "Pt says now taking this daily and insurance won't pay until Rx has new directions". LM for pt to return call.

## 2019-08-05 ENCOUNTER — Other Ambulatory Visit: Payer: Self-pay

## 2019-08-05 MED ORDER — OMEPRAZOLE 40 MG PO CPDR: DELAYED_RELEASE_CAPSULE | ORAL | 1 refills | Status: DC

## 2019-08-05 NOTE — Telephone Encounter (Signed)
Spoke with patient and confirmed he was taking med daily. New Rx sent to [place].

## 2019-08-11 ENCOUNTER — Ambulatory Visit (HOSPITAL_COMMUNITY)
Admission: RE | Admit: 2019-08-11 | Discharge: 2019-08-11 | Disposition: A | Discharge status: Home / Self Care | Payer: Medicare Other | Source: Ambulatory Visit | Attending: Internal Medicine | Admitting: Internal Medicine

## 2019-08-11 ENCOUNTER — Other Ambulatory Visit: Payer: Self-pay

## 2019-08-11 DIAGNOSIS — D509 Iron deficiency anemia, unspecified: Secondary | ICD-10-CM | POA: Diagnosis not present

## 2019-08-11 MED ORDER — SODIUM CHLORIDE 0.9 % IV SOLN
510.0000 mg | Freq: Once | INTRAVENOUS | Status: AC
  Administered 2019-08-11: 510 mg via INTRAVENOUS
  Filled 2019-08-11: qty 17

## 2019-08-11 MED ORDER — SODIUM CHLORIDE 0.9 % IV SOLN
INTRAVENOUS | Status: DC | PRN
  Administered 2019-08-11: 250 mL via INTRAVENOUS

## 2019-08-11 NOTE — Discharge Instructions (Signed)

## 2019-08-11 NOTE — Progress Notes (Signed)
Patient ID: Glen Barnett, male   DOB: [birth year], 84 y.o.   MRN: [id number]                                                                                                                            Patient Care Center Note   Diagnosis: Iron Deficiency Anemia   Provider: Jethro Bastos, PA-C   Procedure: IV Feraheme   Note: Patient received IV Fereheme via PIV. Tolerated well. Observation 30 min post infusion . Vitals stable. Discharge instructions given.  Alert, oriented and ambulatory at discharge.   [name], RN 08/11/2019

## 2019-08-13 ENCOUNTER — Other Ambulatory Visit: Payer: Self-pay

## 2019-08-13 DIAGNOSIS — I10 Essential (primary) hypertension: Secondary | ICD-10-CM

## 2019-08-13 MED ORDER — VERAPAMIL HCL ER 240 MG PO TBCR: 240.0000 mg | EXTENDED_RELEASE_TABLET | Freq: Two times a day (BID) | ORAL | 0 refills | Status: DC

## 2019-08-20 ENCOUNTER — Other Ambulatory Visit: Payer: Self-pay

## 2019-08-28 ENCOUNTER — Other Ambulatory Visit: Payer: Self-pay | Admitting: Family Medicine

## 2019-10-07 DIAGNOSIS — H353211 Exudative age-related macular degeneration, right eye, with active choroidal neovascularization: Secondary | ICD-10-CM | POA: Diagnosis not present

## 2019-10-07 DIAGNOSIS — H353122 Nonexudative age-related macular degeneration, left eye, intermediate dry stage: Secondary | ICD-10-CM | POA: Diagnosis not present

## 2019-10-07 DIAGNOSIS — H43813 Vitreous degeneration, bilateral: Secondary | ICD-10-CM | POA: Diagnosis not present

## 2019-10-07 DIAGNOSIS — H35372 Puckering of macula, left eye: Secondary | ICD-10-CM | POA: Diagnosis not present

## 2019-10-08 ENCOUNTER — Ambulatory Visit: Payer: Medicare Other | Admitting: Family Medicine

## 2019-10-12 ENCOUNTER — Other Ambulatory Visit: Payer: Self-pay

## 2019-10-12 MED ORDER — OMEPRAZOLE 40 MG PO CPDR: DELAYED_RELEASE_CAPSULE | ORAL | 1 refills | Status: DC

## 2019-10-13 ENCOUNTER — Ambulatory Visit (INDEPENDENT_AMBULATORY_CARE_PROVIDER_SITE_OTHER): Payer: Medicare Other | Admitting: Family Medicine

## 2019-10-13 ENCOUNTER — Encounter: Payer: Self-pay | Admitting: Family Medicine

## 2019-10-13 ENCOUNTER — Other Ambulatory Visit: Payer: Self-pay

## 2019-10-13 VITALS — BP 140/76 | HR 56 | Temp 98.4°F | Resp 16 | Ht 70.0 in | Wt 184.4 lb

## 2019-10-13 DIAGNOSIS — R531 Weakness: Secondary | ICD-10-CM

## 2019-10-13 DIAGNOSIS — R634 Abnormal weight loss: Secondary | ICD-10-CM | POA: Diagnosis not present

## 2019-10-13 DIAGNOSIS — R5381 Other malaise: Secondary | ICD-10-CM

## 2019-10-13 DIAGNOSIS — I10 Essential (primary) hypertension: Secondary | ICD-10-CM | POA: Diagnosis not present

## 2019-10-13 DIAGNOSIS — D500 Iron deficiency anemia secondary to blood loss (chronic): Secondary | ICD-10-CM

## 2019-10-13 DIAGNOSIS — R2681 Unsteadiness on feet: Secondary | ICD-10-CM

## 2019-10-13 NOTE — Progress Notes (Signed)
OFFICE VISIT  10/13/2019   CC:  Chief Complaint  Patient presents with  . Follow-up    RCI, pt is not fasting    HPI:    Patient is a 86 y.o. Caucasian male who presents for 3 mo f/u HTN, CRI with GFR 60s, and iron def anemia due to chronic GI blood loss. Pt was evaluated by GI 07/21/19 and it was felt that Cameron's erosions were likely the cause of pt's recurrence of this problem (upper and lower endoscopies done for same problem in 2013). At the time of his GI visit he opted for conservative approach/no procedures--> IV iron, daily PPI therapy, and monitoring of hemoglobin. In review of EMR it does appear that he got IV iron on 10/28 and 11/11, 2020.  No recheck of Hb has been done that I can find.  Interim hx: Feeling fine.  Iron made him feel much better. Taking omeprazole 40mg  once a day. No melena.  No pain anywhere.    HTN: home bp monitoring rarely.  His recollection is that it has been gradually creeping up, similar to today's. No lightheadedness.  No HAs.  Sleeping fine. No dry mouth. Good urine output, light yellow and sometimes clear. Appetite is great, eating 1 good meal per day at least and says he eats too much at that meal. Says he thinks he eats well.  Has maintained wt the last 3 mo.  ROS: no fevers, no CP, no SOB, no wheezing, no cough, no dizziness, no HAs, no rashes, no melena/hematochezia.  No polyuria or polydipsia.  No myalgias or arthralgias.  Some feeling like he walks unsteadily, less stable when he bends over to get something.  No falls.  No numbness in feet.  He can roll over in bed and can get cans/jars open although it is harder than in the past.  He has no tremor, no stooping over when he walks.  No focal weakness.  No trouble swallowing or speaking.  No acute vision or hearing changes.  No vertigo.    Past Medical History:  Diagnosis Date  . Arthritis   . Ataxia 2014/15   brain MRI 02/2014 showed encephalomalacia parietal and occip lobes c/w old  infarcts, plus diffuse cerebral atrophy, o/w normal  . Cataract   . Chronic renal insufficiency, stage 2 (mild) 12/2016   GFR 60s-70s  . DDD (degenerative disc disease)    L-spine  . Dermatitis    ?Contact derm? per [facility] Derm MD: Dr. Threasa Alpha (05/09/16): punch bx 06/18/16: chronic eczematous derm/contact derm, neg fungal stain.  Allergy (RAST) testing NEG.  . GERD (gastroesophageal reflux disease)   . Heme positive stool 02/2012   colonoscopy normal.  EGD showed small hiatal hernia with associated Cameron's erosions.  Hemoccults NEG x 3 09/2014. Hem + iron def 07/2019->conservative mgmt.  . Hyperlipidemia   . Hypertension   . Iron deficiency anemia 02/2012; 07/2019   2013 Mild iron deficiency->small upper GI erosions, colonoscopy normal.  07/2019->Hb 7.5, heme+, GI eval->pt chose no endoscopy->IV iron and Hb monitoring, PO iron indefinitely.  . Macular degeneration, age related    exudative R eye; nonexudative L eye.  Vitreous degeneration bilat.  . Obesity (BMI 30-39.9)   . Retinal hemorrhage 2016   w/ retinal detachment (right eye) : anticoagulants stopped due to this  . Venous insufficiency of both lower extremities     Past Surgical History:  Procedure Laterality Date  . APPENDECTOMY  1935  . cataract surg  Bilateral/ [id number]  . COLONOSCOPY  05/12/12   for iron def anemia--NORMAL RESULT  . ESOPHAGOGASTRODUODENOSCOPY  06/17/12   Small hiatal hernia with associated Cameron's erosions--likely source of his mild iron def anemia.  Marland Kitchen NASAL SINUS SURGERY     1998  . TONSILLECTOMY  1935    Outpatient Medications Prior to Visit  Medication Sig Dispense Refill  . Aflibercept (EYLEA IO) Inject into the eye. One injection into right eye every month    . Cholecalciferol (VITAMIN D3) 2000 UNITS TABS Take by mouth daily.      Marland Kitchen losartan (COZAAR) 100 MG tablet TAKE 1 TABLET BY MOUTH EVERY DAY 90 tablet 1  . Multiple Vitamins-Minerals (PRESERVISION AREDS 2 PO) Take 1 tablet by mouth 2  (two) times daily.     . multivitamin (THERAGRAN) per tablet Take 1 tablet by mouth daily.      Marland Kitchen omeprazole (PRILOSEC) 40 MG capsule Take 1 tablet by mouth daily. 90 capsule 1  . verapamil (CALAN-SR) 240 MG CR tablet Take 1 tablet (240 mg total) by mouth 2 (two) times daily. 180 tablet 0  . cetirizine (ZYRTEC) 10 MG tablet Take 10 mg by mouth daily.  5  . ipratropium (ATROVENT) 0.03 % nasal spray 1-2 SPRAYS IN EACH NOSTRIL ABOUT 10 MINUTES PRIOR TO EACH MEAL ( 4 TIMES A DAY ) (Patient not taking: Reported on 10/13/2019) 30 mL 5  . tobramycin (TOBREX) 0.3 % ophthalmic solution Place 1 drop into the right eye. Four times daily begin 1 day prior to treatment  5   No facility-administered medications prior to visit.    No Known Allergies  ROS As per HPI  PE: Repeat bp manually at end of visit was 140/76 Blood pressure 140/76, pulse (!) 56, temperature 98.4 F (36.9 C), temperature source Temporal, resp. rate 16, height 5\' 10"  (1.778 m), weight 184 lb 6.4 oz (83.6 kg), SpO2 95 %. Body mass index is 26.46 kg/m.  Gen: Alert, well appearing.  Patient is oriented to person, place, time, and situation. [id number]: no injection, icteris, swelling, or exudate.  EOMI, PERRLA. Mouth: lips without lesion/swelling.  Oral mucosa pink and moist. Oropharynx without erythema, exudate, or swelling.  CV: RRR, no m/r/g.   LUNGS: CTA bilat, nonlabored resps, good aeration in all lung fields. HR 70s by me. ABD: soft, NT/ND EXT: no clubbing or cyanosis.  He has 2+ bilat LL pitting edema  LABS:  Lab Results  Component Value Date   WBC 5.4 07/08/2019   HGB 7.5 Repeated and verified X2. (LL) 07/08/2019   HCT 25.6 (L) 07/08/2019   MCV 73.5 (L) 07/08/2019   PLT 513.0 (H) 07/08/2019   Lab Results  Component Value Date   IRON 6 (L) 07/09/2019   TIBC 296 08/26/2016   FERRITIN 5.4 (L) 07/09/2019     Chemistry      Component Value Date/Time   NA 138 07/08/2019 1411   K 4.2 07/08/2019 1411   CL 104  07/08/2019 1411   CO2 27 07/08/2019 1411   BUN 17 07/08/2019 1411   CREATININE 0.91 07/08/2019 1411      Component Value Date/Time   CALCIUM 9.1 07/08/2019 1411   ALKPHOS 80 07/08/2019 1411   AST 11 07/08/2019 1411   ALT 9 07/08/2019 1411   BILITOT 0.3 07/08/2019 1411     Hemoccults positive 07/20/19  Lab Results  Component Value Date   VITAMINB12 367 03/10/2012   Hemoccult x 1 today in office was NEGATIVE.  IMPRESSION AND  PLAN:  1) Recurrent UGI bleeding, iron def anemia. No sign of ongoing bleeding at this time. He is s/p iron infusions a couple months ago. Continue omeprazole 40mg  qd indefinitely. Monitor CBC and iron studies today. Avoid NSAIDs.  2) BP up here today but need more home checks before making any changes. I want to be conservative with his bp treatment, permissive htn somewhat. BMET today. Instructions: Check your blood pressure and heart rate (pulse) three times per week and write these numbers down so we can review them at your next follow up visit in 3 months.  3) Mild chronic debilitation: not progressing rapidly. He has memory impairment that seems to be gradually progressing as well. He doesn't really need any home health at this time but we'll have to get this at some time in the near future for PT/OT if he'll accept it.  4) Wt loss: he was ill, not eating well, also had some excessive fluid: eating better now, fluid balance likely a little negative if anything.  Checking BUN/Cr today.  An After Visit Summary was printed and given to the patient.  FOLLOW UP: Return in about 3 months (around 01/11/2020) for routine chronic illness f/u.  Signed:  Crissie Sickles, MD           10/13/2019

## 2019-10-13 NOTE — Patient Instructions (Signed)
Check your blood pressure and heart rate (pulse) three times per week and write these numbers down so we can review them at your next follow up visit in 3 months.  Continue to take omeprazole EVERY DAY.

## 2019-10-14 ENCOUNTER — Encounter: Payer: Self-pay | Admitting: Family Medicine

## 2019-10-14 LAB — BASIC METABOLIC PANEL
BUN: 21 mg/dL (ref 6–23)
CO2: 26 mEq/L (ref 19–32)
Calcium: 9 mg/dL (ref 8.4–10.5)
Chloride: 104 mEq/L (ref 96–112)
Creatinine, Ser: 0.91 mg/dL (ref 0.40–1.50)
GFR: 78.23 mL/min (ref >60.00)
Glucose, Bld: 98 mg/dL (ref 70–99)
Potassium: 3.9 mEq/L (ref 3.5–5.1)
Sodium: 139 mEq/L (ref 135–145)

## 2019-10-14 LAB — CBC
HCT: 38.9 % — ABNORMAL LOW (ref 39.0–52.0)
Hemoglobin: 12.7 g/dL — ABNORMAL LOW (ref 13.0–17.0)
MCHC: 32.7 g/dL (ref 30.0–36.0)
MCV: 92.5 fl (ref 78.0–100.0)
Platelets: 361 10*3/uL (ref 150.0–400.0)
RBC: 4.21 Mil/uL — ABNORMAL LOW (ref 4.22–5.81)
RDW: 29.1 % — ABNORMAL HIGH (ref 11.5–15.5)
WBC: 6.4 10*3/uL (ref 4.0–10.5)

## 2019-10-14 LAB — IRON,TIBC AND FERRITIN PANEL
%SAT: 32 % (calc) (ref 20–48)
Ferritin: 34 ng/mL (ref 24–380)
Iron: 102 ug/dL (ref 50–180)
TIBC: 315 mcg/dL (calc) (ref 250–425)

## 2019-10-14 LAB — VITAMIN B12: Vitamin B-12: 351 pg/mL (ref 211–911)

## 2019-10-19 DIAGNOSIS — L678 Other hair color and hair shaft abnormalities: Secondary | ICD-10-CM | POA: Diagnosis not present

## 2019-10-19 DIAGNOSIS — L905 Scar conditions and fibrosis of skin: Secondary | ICD-10-CM | POA: Diagnosis not present

## 2019-10-19 DIAGNOSIS — L570 Actinic keratosis: Secondary | ICD-10-CM | POA: Diagnosis not present

## 2019-10-19 DIAGNOSIS — L2089 Other atopic dermatitis: Secondary | ICD-10-CM | POA: Diagnosis not present

## 2019-11-05 ENCOUNTER — Other Ambulatory Visit: Payer: Self-pay | Admitting: Family Medicine

## 2019-11-05 DIAGNOSIS — I10 Essential (primary) hypertension: Secondary | ICD-10-CM

## 2019-12-05 ENCOUNTER — Other Ambulatory Visit: Payer: Self-pay | Admitting: Family Medicine

## 2019-12-16 DIAGNOSIS — H43813 Vitreous degeneration, bilateral: Secondary | ICD-10-CM | POA: Diagnosis not present

## 2019-12-16 DIAGNOSIS — H353211 Exudative age-related macular degeneration, right eye, with active choroidal neovascularization: Secondary | ICD-10-CM | POA: Diagnosis not present

## 2019-12-16 DIAGNOSIS — H35372 Puckering of macula, left eye: Secondary | ICD-10-CM | POA: Diagnosis not present

## 2019-12-16 DIAGNOSIS — H353122 Nonexudative age-related macular degeneration, left eye, intermediate dry stage: Secondary | ICD-10-CM | POA: Diagnosis not present

## 2020-01-07 ENCOUNTER — Other Ambulatory Visit: Payer: Self-pay

## 2020-01-10 ENCOUNTER — Ambulatory Visit (INDEPENDENT_AMBULATORY_CARE_PROVIDER_SITE_OTHER): Payer: Medicare Other | Admitting: Family Medicine

## 2020-01-10 ENCOUNTER — Other Ambulatory Visit: Payer: Self-pay

## 2020-01-10 ENCOUNTER — Encounter: Payer: Self-pay | Admitting: Family Medicine

## 2020-01-10 VITALS — BP 143/77 | HR 51 | Temp 98.0°F | Resp 16 | Ht 70.0 in | Wt 190.4 lb

## 2020-01-10 DIAGNOSIS — I10 Essential (primary) hypertension: Secondary | ICD-10-CM | POA: Diagnosis not present

## 2020-01-10 DIAGNOSIS — N183 Chronic kidney disease, stage 3 unspecified: Secondary | ICD-10-CM

## 2020-01-10 DIAGNOSIS — R7303 Prediabetes: Secondary | ICD-10-CM

## 2020-01-10 DIAGNOSIS — D500 Iron deficiency anemia secondary to blood loss (chronic): Secondary | ICD-10-CM | POA: Diagnosis not present

## 2020-01-10 MED ORDER — CLONIDINE HCL 0.1 MG PO TABS: ORAL_TABLET | ORAL | 0 refills | Status: DC

## 2020-01-10 NOTE — Progress Notes (Signed)
OFFICE VISIT  01/10/2020   CC:  Chief Complaint  Patient presents with  . Follow-up    RCI, pt is fasting   HPI:    Patient is a 86 y.o. Caucasian male who presents for 3 mo f/u HTN, CRI II/III with GFR in the 60s, hx of IDA from recurrent upper GI bleed. A/P as of last visit: "1) Recurrent UGI bleeding, iron def anemia. No sign of ongoing bleeding at this time. He is s/p iron infusions a couple months ago. Continue omeprazole 40mg  qd indefinitely. Monitor CBC and iron studies today. Avoid NSAIDs.  2) BP up here today but need more home checks before making any changes. I want to be conservative with his bp treatment, permissive htn somewhat. BMET today. Instructions: Check your blood pressure and heart rate (pulse) three times per week and write these numbers down so we can review them at your next follow up visit in 3 months.  3) Mild chronic debilitation: not progressing rapidly. He has memory impairment that seems to be gradually progressing as well. He doesn't really need any home health at this time but we'll have to get this at some time in the near future for PT/OT if he'll accept it.  4) Wt loss: he was ill, not eating well, also had some excessive fluid: eating better now, fluid balance likely a little negative if anything.  Checking BUN/Cr today.  INTERIM HX: Doing pretty good.   Energy level stable.  Chronic poor sleep and bilat knee pains chronic. Appetite is fine, eats 2 meals per day. No melena or BRBPR. Only gets up one time to pee in the night.  Avg sleep is 4 hours per night. Wakes up feeling rested for the most part.  No home bp monitoring recently. Says the reading he got when he checked it once was "real high". He has no recollection of any specific number.  ROS: no fevers, no CP, no SOB, no wheezing, no cough, no dizziness, no HAs, no rashes, no melena/hematochezia.  No polyuria or polydipsia. No focal weakness, paresthesias, or tremors.  No  acute vision or hearing abnormalities. No n/v/d or abd pain.  No palpitations.      Past Medical History:  Diagnosis Date  . Arthritis   . Ataxia 2014/15   brain MRI 02/2014 showed encephalomalacia parietal and occip lobes c/w old infarcts, plus diffuse cerebral atrophy, o/w normal  . Cataract   . Chronic renal insufficiency, stage 2 (mild) 12/2016   GFR 60s-70s  . DDD (degenerative disc disease)    L-spine  . Dermatitis    ?Contact derm? per [facility] MD: Dr. Threasa Alpha (05/09/16): punch bx 06/18/16: chronic eczematous derm/contact derm, neg fungal stain.  Allergy (RAST) testing NEG.  . GERD (gastroesophageal reflux disease)   . Heme positive stool 02/2012   colonoscopy normal.  EGD showed small hiatal hernia with associated Cameron's erosions.  Hemoccults NEG x 3 09/2014. Hem + iron def 07/2019->conservative mgmt.  . Hyperlipidemia   . Hypertension   . Iron deficiency anemia 02/2012; 07/2019   2013 Mild iron deficiency->small upper GI erosions, colonoscopy normal.  07/2019->Hb 7.5, heme+, GI eval->pt chose no endoscopy->IV iron and Hb monitoring, PO iron indefinitely: Hb/iron normal on recheck 10/2019.  . Macular degeneration, age related    exudative R eye; nonexudative L eye.  Vitreous degeneration bilat.  . Obesity (BMI 30-39.9)   . Retinal hemorrhage 2016   w/ retinal detachment (right eye) : anticoagulants stopped due to this  .  Venous insufficiency of both lower extremities     Past Surgical History:  Procedure Laterality Date  . APPENDECTOMY  1935  . cataract surg     Bilateral/ 08-13  . COLONOSCOPY  05/12/12   for iron def anemia--NORMAL RESULT  . ESOPHAGOGASTRODUODENOSCOPY  06/17/12   Small hiatal hernia with associated Cameron's erosions--likely source of his mild iron def anemia.  Marland Kitchen NASAL SINUS SURGERY     1998  . TONSILLECTOMY  1935    Outpatient Medications Prior to Visit  Medication Sig Dispense Refill  . Aflibercept (EYLEA IO) Inject into the eye. One  injection into right eye every month    . Cholecalciferol (VITAMIN D3) 2000 UNITS TABS Take by mouth daily.      Marland Kitchen ipratropium (ATROVENT) 0.03 % nasal spray 1-2 SPRAYS IN EACH NOSTRIL ABOUT 10 MINUTES PRIOR TO EACH MEAL ( 4 TIMES A DAY ) 30 mL 5  . losartan (COZAAR) 100 MG tablet TAKE 1 TABLET BY MOUTH EVERY DAY 90 tablet 1  . Multiple Vitamins-Minerals (PRESERVISION AREDS 2 PO) Take 1 tablet by mouth 2 (two) times daily.     . verapamil (CALAN-SR) 240 MG CR tablet TAKE 1 TABLET (240 MG TOTAL) BY MOUTH 2 (TWO) TIMES DAILY. 180 tablet 1  . cetirizine (ZYRTEC) 10 MG tablet Take 10 mg by mouth daily.  5  . multivitamin (THERAGRAN) per tablet Take 1 tablet by mouth daily.      Marland Kitchen omeprazole (PRILOSEC) 40 MG capsule Take 1 tablet by mouth daily. (Patient not taking: Reported on 01/10/2020) 90 capsule 1  . tobramycin (TOBREX) 0.3 % ophthalmic solution Place 1 drop into the right eye. Four times daily begin 1 day prior to treatment  5  . triamcinolone cream (KENALOG) 0.1 % APPLY TO AFFECTED AREA TWICE A DAY FOR UP TO 3 WEEKS IN A ROW     No facility-administered medications prior to visit.    No Known Allergies  ROS As per HPI  PE: Blood pressure (!) 143/77, pulse (!) 51, temperature 98 F (36.7 C), temperature source Temporal, resp. rate 16, height 5\' 10"  (1.778 m), weight 190 lb 6.4 oz (86.4 kg), SpO2 96 %. Gen: Alert, well appearing.  Patient is oriented to person, place, time, and situation. AFFECT: pleasant, lucid thought and speech. CV: RRR, [id number] systolic murmur, no diastolic murmur.  No rub/gallop. Chest is clear, no wheezing or rales. Normal symmetric air entry throughout both lung fields. No chest wall deformities or tenderness. EXT: no clubbing or cyanosis.  3+ R LL pitting edema.  2+ L LL pitting edema.   LABS:  Lab Results  Component Value Date   TSH 2.44 07/08/2019   Lab Results  Component Value Date   WBC 6.4 10/13/2019   HGB 12.7 (L) 10/13/2019   HCT 38.9 (L) 10/13/2019    MCV 92.5 10/13/2019   PLT 361.0 10/13/2019   Lab Results  Component Value Date   IRON 102 10/13/2019   TIBC 315 10/13/2019   FERRITIN 34 10/13/2019    Lab Results  Component Value Date   CREATININE 0.91 10/13/2019   BUN 21 10/13/2019   NA 139 10/13/2019   K 3.9 10/13/2019   CL 104 10/13/2019   CO2 26 10/13/2019   Lab Results  Component Value Date   ALT 9 07/08/2019   AST 11 07/08/2019   ALKPHOS 80 07/08/2019   BILITOT 0.3 07/08/2019   Lab Results  Component Value Date   CHOL 192 06/30/2017   Lab  Results  Component Value Date   HDL 57.20 06/30/2017   Lab Results  Component Value Date   LDLCALC 104 (H) 06/30/2017   Lab Results  Component Value Date   TRIG 156.0 (H) 06/30/2017   Lab Results  Component Value Date   CHOLHDL 3 06/30/2017   Lab Results  Component Value Date   PSA 3.11 07/08/2019   PSA 1.45 10/08/2010   Lab Results  Component Value Date   HGBA1C 6.1 07/08/2019    IMPRESSION AND PLAN:  1) HTN: bp here acceptable, goal about 140/90 for him. Unclear bp's at home.  Will add a dose of clonidine 0.1mg  qhs mainly to see if it helps him sleep better but may also helps some with bp. BMET today.  2) CRI II/III: follow BMET today.  Avoids NSAIDs.  3) Insomnia: add trial of low dose clonidine today.  4) Hx of IDA from recurrent slow upper GI bleeding. No signs of bleeding at this time. Monitoring Hb today. He has still been taking 1 iron tab daily per his report today. NO NSAIDs.  An After Visit Summary was printed and given to the patient.  FOLLOW UP: Return for 2-3 wks f/u HTN and insomnia.  Signed:  Crissie Sickles, MD           01/10/2020

## 2020-01-10 NOTE — Patient Instructions (Signed)
Check your blood pressure three times per week. Write the numbers down and bring them back with you when you return in 2-3 weeks.

## 2020-01-11 LAB — CBC
HCT: 39.5 % (ref 39.0–52.0)
Hemoglobin: 13.3 g/dL (ref 13.0–17.0)
MCHC: 33.6 g/dL (ref 30.0–36.0)
MCV: 100.6 fl — ABNORMAL HIGH (ref 78.0–100.0)
Platelets: 363 10*3/uL (ref 150.0–400.0)
RBC: 3.92 Mil/uL — ABNORMAL LOW (ref 4.22–5.81)
RDW: 15.1 % (ref 11.5–15.5)
WBC: 5.9 10*3/uL (ref 4.0–10.5)

## 2020-01-11 LAB — BASIC METABOLIC PANEL
BUN: 20 mg/dL (ref 6–23)
CO2: 28 mEq/L (ref 19–32)
Calcium: 9.3 mg/dL (ref 8.4–10.5)
Chloride: 104 mEq/L (ref 96–112)
Creatinine, Ser: 0.81 mg/dL (ref 0.40–1.50)
GFR: 89.43 mL/min (ref >60.00)
Glucose, Bld: 103 mg/dL — ABNORMAL HIGH (ref 70–99)
Potassium: 4.3 mEq/L (ref 3.5–5.1)
Sodium: 140 mEq/L (ref 135–145)

## 2020-01-11 LAB — HEMOGLOBIN A1C: Hgb A1c MFr Bld: 5.6 % (ref 4.6–6.5)

## 2020-01-21 ENCOUNTER — Encounter: Payer: Self-pay | Admitting: Family Medicine

## 2020-01-21 ENCOUNTER — Telehealth: Payer: Self-pay

## 2020-01-21 ENCOUNTER — Ambulatory Visit (INDEPENDENT_AMBULATORY_CARE_PROVIDER_SITE_OTHER): Payer: Medicare Other | Admitting: Family Medicine

## 2020-01-21 ENCOUNTER — Other Ambulatory Visit: Payer: Self-pay

## 2020-01-21 VITALS — BP 145/66 | HR 61 | Temp 97.9°F | Resp 16 | Ht 70.0 in | Wt 188.8 lb

## 2020-01-21 DIAGNOSIS — I10 Essential (primary) hypertension: Secondary | ICD-10-CM | POA: Diagnosis not present

## 2020-01-21 MED ORDER — CLONIDINE HCL 0.1 MG PO TABS: ORAL_TABLET | ORAL | 0 refills | Status: DC

## 2020-01-21 NOTE — Progress Notes (Signed)
CC:  Patient is a 86 yo male with a PMH of HTN, HLD, CRI II, hx of IDA from recurrent upper GI bleed, arthritis, and GERD who presents today for elevated BP.   HPI:  BP in office today: 145/66 BP at home: ~150s/70s ; but vary from as low as 125/66 to as high as 202/92  A/P from last visit:  "1) HTN: bp here acceptable, goal about 140/90 for him. Unclear bp's at home.  Will add a dose of clonidine 0.1mg  qhs mainly to see if it helps him sleep better but may also helps some with bp. BMET today.  2) CRI II/III: follow BMET today.  Avoids NSAIDs.  3) Insomnia: add trial of low dose clonidine today.  4) Hx of IDA from recurrent slow upper GI bleeding. No signs of bleeding at this time. Monitoring Hb today. He has still been taking 1 iron tab daily per his report today. NO NSAIDs."  Interim hx: Feeling well. No side effects from clonidine.  Sleep is unchanged. BPs avg 140s/70s-80s.  Fair amount of readings in 150/90 range and a couple of readings in 190/100 range. However, moderate amount of readings in 120s/70s. Headaches? Palpitations? Fatigue? Dizziness? -  "Not really"   Knees sore, balance off, congestion this AM that improved with blowing his nose this morning  -takes Zyrtec rarely for sinuses     PMH: Past Medical History:  Diagnosis Date  . Arthritis   . Ataxia 2014/15   brain MRI 02/2014 showed encephalomalacia parietal and occip lobes c/w old infarcts, plus diffuse cerebral atrophy, o/w normal  . Cataract   . Chronic renal insufficiency, stage 2 (mild) 12/2016   GFR 60s-70s  . DDD (degenerative disc disease)    L-spine  . Dermatitis    ?Contact derm? per [facility] MD: Dr. Threasa Alpha (05/09/16): punch bx 06/18/16: chronic eczematous derm/contact derm, neg fungal stain.  Allergy (RAST) testing NEG.  . GERD (gastroesophageal reflux disease)   . Heme positive stool 02/2012   colonoscopy normal.  EGD showed small hiatal hernia with associated Cameron's erosions.   Hemoccults NEG x 3 09/2014. Hem + iron def 07/2019->conservative mgmt.  . Hyperlipidemia   . Hypertension   . Iron deficiency anemia 02/2012; 07/2019   2013 Mild iron deficiency->small upper GI erosions, colonoscopy normal.  07/2019->Hb 7.5, heme+, GI eval->pt chose no endoscopy->IV iron and Hb monitoring, PO iron indefinitely: Hb/iron normal on recheck 10/2019.  . Macular degeneration, age related    exudative R eye; nonexudative L eye.  Vitreous degeneration bilat.  . Obesity (BMI 30-39.9)   . Retinal hemorrhage 2016   w/ retinal detachment (right eye) : anticoagulants stopped due to this  . Venous insufficiency of both lower extremities     M/A: Current Outpatient Medications on File Prior to Visit  Medication Sig Dispense Refill  . Aflibercept (EYLEA IO) Inject into the eye. One injection into right eye every month    . cetirizine (ZYRTEC) 10 MG tablet Take 10 mg by mouth daily.  5  . Cholecalciferol (VITAMIN D3) 2000 UNITS TABS Take by mouth daily.      . cloNIDine (CATAPRES) 0.1 MG tablet 1 tab po qhs 30 tablet 0  . ipratropium (ATROVENT) 0.03 % nasal spray 1-2 SPRAYS IN EACH NOSTRIL ABOUT 10 MINUTES PRIOR TO EACH MEAL ( 4 TIMES A DAY ) 30 mL 5  . losartan (COZAAR) 100 MG tablet TAKE 1 TABLET BY MOUTH EVERY DAY 90 tablet 1  . Multiple Vitamins-Minerals (PRESERVISION  AREDS 2 PO) Take 1 tablet by mouth 2 (two) times daily.     . multivitamin (THERAGRAN) per tablet Take 1 tablet by mouth daily.      Marland Kitchen omeprazole (PRILOSEC) 40 MG capsule Take 1 tablet by mouth daily. (Patient not taking: Reported on 01/10/2020) 90 capsule 1  . tobramycin (TOBREX) 0.3 % ophthalmic solution Place 1 drop into the right eye. Four times daily begin 1 day prior to treatment  5  . verapamil (CALAN-SR) 240 MG CR tablet TAKE 1 TABLET (240 MG TOTAL) BY MOUTH 2 (TWO) TIMES DAILY. 180 tablet 1   No current facility-administered medications on file prior to visit.   No Known Allergies  FH: Family History  Problem  Relation Age of Onset  . Liver cancer Mother     SH: Social History   Socioeconomic History  . Marital status: Widowed    Spouse name: Not on file  . Number of children: Not on file  . Years of education: Not on file  . Highest education level: Not on file  Occupational History  . Not on file  Tobacco Use  . Smoking status: Never Smoker  . Smokeless tobacco: Never Used  Substance and Sexual Activity  . Alcohol use: No  . Drug use: No  . Sexual activity: Not on file  Other Topics Concern  . Not on file  Social History Narrative   Widower.  Wife died 08/30/2011 of brain cancer.   He relocated from [place] to live near son.   NO exercise.  No T/A/Ds.   Retired Chief Financial Officer.   Social Determinants of Health   Financial Resource Strain:   . Difficulty of Paying Living Expenses:   Food Insecurity:   . Worried About Charity fundraiser in the Last Year:   . Arboriculturist in the Last Year:   Transportation Needs:   . Film/video editor (Medical):   Marland Kitchen Lack of Transportation (Non-Medical):   Physical Activity:   . Days of Exercise per Week:   . Minutes of Exercise per Session:   Stress:   . Feeling of Stress :   Social Connections:   . Frequency of Communication with Friends and Family:   . Frequency of Social Gatherings with Friends and Family:   . Attends Religious Services:   . Active Member of Clubs or Organizations:   . Attends Archivist Meetings:   Marland Kitchen Marital Status:     ROS: Review of Systems  Constitutional: Negative.   HENT: Positive for congestion. Negative for ear discharge, ear pain, hearing loss, nosebleeds, sinus pain, sore throat and tinnitus.   Eyes: Negative.   Respiratory: Negative.  Negative for stridor.   Cardiovascular: Positive for leg swelling (a little leg swelling). Negative for chest pain, palpitations, orthopnea, claudication and PND.  Gastrointestinal: Negative.   Genitourinary: Negative.   Musculoskeletal: Positive for  joint pain (knees hurt b/l). Negative for falls.  Skin: Negative.   Neurological: Negative.   Endo/Heme/Allergies: Negative.   Psychiatric/Behavioral: Negative.     PE: Vitals with BMI 01/10/2020 10/13/2019 10/13/2019  Height 5\' 10"  - 5\' 10"   Weight 190 lbs 6 oz - 184 lbs 6 oz  BMI [id number] - [id number]  Systolic [id number] [id number]  Diastolic 77 76 77  Pulse 51 - 56    Physical Exam  Constitutional: He is oriented to person, place, and time and well-developed, well-nourished, and in no distress. No distress.  HENT:  Head: Normocephalic and atraumatic.  Eyes: Pupils are equal, round, and reactive to light.  Neck: No JVD present. No tracheal deviation present. No thyromegaly present.  Cardiovascular: Normal rate, regular rhythm and intact distal pulses. Exam reveals no gallop and no friction rub.  Murmur heard. Pulmonary/Chest: Effort normal and breath sounds normal. No stridor. No respiratory distress. He has no wheezes. He has no rales. He exhibits no tenderness.  Musculoskeletal:        General: Edema (trace/1+ pitting edema b/l) present. No tenderness or deformity.     Cervical back: Normal range of motion and neck supple.  Lymphadenopathy:    He has no cervical adenopathy.  Neurological: He is alert and oriented to person, place, and time.  Skin: Skin is warm and dry. He is not diaphoretic.  Psychiatric: Mood, memory, affect and judgment normal.    Labs: Recent Results (from the past 2160 hour(s))  Basic metabolic panel     Status: Abnormal   Collection Time: 01/10/20  2:01 PM  Result Value Ref Range   Sodium 140 135 - 145 mEq/L   Potassium 4.3 3.5 - 5.1 mEq/L   Chloride 104 96 - 112 mEq/L   CO2 28 19 - 32 mEq/L   Glucose, Bld 103 (H) 70 - 99 mg/dL   BUN 20 6 - 23 mg/dL   Creatinine, Ser 0.81 0.40 - 1.50 mg/dL   GFR 89.43 >60.00 mL/min   Calcium 9.3 8.4 - 10.5 mg/dL  Hemoglobin A1c     Status: None   Collection Time: 01/10/20  2:01 PM  Result Value Ref Range   Hgb A1c MFr Bld 5.6  4.6 - 6.5 %    Comment: Glycemic Control Guidelines for People with Diabetes:Non Diabetic:  <6%Goal of Therapy: <7%Additional Action Suggested:  >8%   CBC     Status: Abnormal   Collection Time: 01/10/20  2:01 PM  Result Value Ref Range   WBC 5.9 4.0 - 10.5 K/uL   RBC 3.92 (L) 4.22 - 5.81 Mil/uL   Platelets 363.0 150.0 - 400.0 K/uL   Hemoglobin 13.3 13.0 - 17.0 g/dL   HCT 39.5 39.0 - 52.0 %   MCV 100.6 (H) 78.0 - 100.0 fl   MCHC 33.6 30.0 - 36.0 g/dL   RDW 15.1 11.5 - 15.5 %     A/P: In summary, Gurvis is a 86 year old man with a past medical history of HTN, HLD, CRI II, and GERD who presents for f/u on his blood pressure. On physical exam, he has a holosystolic murmur at the aortic region and trace/1+ pitting edema on his legs b/l but is otherwise unremarkable.   My plan is  HTN: double dose of clonidine - take one in the afternoon and one before bed (0.1mg  pill) and continue rest of meds as indicated HLD: continue meds as indicated Heart murmur: continue meds as indicated (this murmur is not new or changed) CRI II :  continue meds as indicated GERD:  continue meds as indicated Allergies:  continue meds as indicated  F/u: one month to check BPs on higher dose of clonidine   Signed: Bennie Dallas, MS3 21 January 2020   ADDENDUM I personally was present during the history, physical exam, and medical decision-making activities of this service and have verified that the service and findings are accurately documented in the student's note. Signed:  Crissie Sickles, MD           01/21/2020

## 2020-01-21 NOTE — Progress Notes (Signed)
See med student note on same date. Signed:  Crissie Sickles, MD           01/21/2020

## 2020-01-21 NOTE — Patient Instructions (Signed)
Start taking your clonidine 0.1mg  tab TWICE PER DAY.

## 2020-01-21 NOTE — Telephone Encounter (Signed)
Pt scheduled for appt this afternoon.  Patient Name: Glen Barnett Gender: Male DOB: 17-Feb-1927 Age: 86 Y 68 M 22 D Return Phone Number: [phone] (Primary) Address: City/State/Zip: [place] [address] Client [facility] - Client Client Site [place] - Day Physician Crissie Sickles - MD Contact Type Call Who Is Calling Patient / Member / Family / Caregiver Call Type Triage / Clinical Relationship To Patient Self Return Phone Number [phone] (Primary) Chief Complaint Blood Pressure High Reason for Call Symptomatic / Request for Abrams states he was seen and was told to check bp more frequently, last 2-3 days it has gone way up, 192/93 185/94 Translation No Nurse Assessment Nurse: Kaleen Odea, RN, Irma Date/Time ([place] Time): 01/21/2020 9:26:57 AM Confirm and document reason for call. If symptomatic, describe symptoms. ---Caller stated he was advised to monitor his BP frequently: BP 192/93 and 185/94. Caller reported he has dizziness, no other symptoms at this time. Has the patient had close contact with a person known or suspected to have the novel coronavirus illness OR traveled / lives in area with major community spread (including international travel) in the last 14 days from the onset of symptoms? * If Asymptomatic, screen for exposure and travel within the last 14 days. ---No Does the patient have any new or worsening symptoms? ---Yes Will a triage be completed? ---Yes Related visit to physician within the last 2 weeks? ---Yes Does the PT have any chronic conditions? (i.e. diabetes, asthma, this includes High risk factors for pregnancy, etc.) ---Yes List chronic conditions. ---hypertension Is this a behavioral health or substance abuse call? ---No Guidelines Guideline Title Affirmed Question Affirmed Notes Nurse Date/Time (Eastern Time) Blood Pressure - High Systolic BP >= 99991111  OR Diastolic >= A999333 Delafuente, RN, Irma 01/21/2020 9:30:04 AM Disp. Time Eilene Ghazi Time) Disposition Final User 01/21/2020 9:34:29 AM See PCP within 24 Hours Yes Delafuente, RN, Benay Spice Page: 2 of 2 Call Id: [id number] [place] Disagree/Comply Comply Caller Understands Yes PreDisposition Call Doctor Care Advice Given Per Guideline SEE PCP WITHIN 24 HOURS: * IF OFFICE WILL BE OPEN: You need to be seen within the next 24 hours. Call your doctor (or NP/PA) when the office opens and make an appointment. CALL BACK IF: * Weakness or numbness of the face, arm or leg on one side of the body occurs * Difficulty walking, difficulty talking, or severe headache occurs * Chest pain or difficulty breathing occurs * You become worse. Referrals REFERRED TO PCP OFFICE

## 2020-01-27 ENCOUNTER — Ambulatory Visit: Payer: Medicare Other | Admitting: Family Medicine

## 2020-01-27 DIAGNOSIS — Z23 Encounter for immunization: Secondary | ICD-10-CM | POA: Diagnosis not present

## 2020-02-13 ENCOUNTER — Other Ambulatory Visit: Payer: Self-pay | Admitting: Family Medicine

## 2020-02-23 ENCOUNTER — Other Ambulatory Visit: Payer: Self-pay

## 2020-02-23 ENCOUNTER — Encounter: Payer: Self-pay | Admitting: Family Medicine

## 2020-02-23 ENCOUNTER — Ambulatory Visit (INDEPENDENT_AMBULATORY_CARE_PROVIDER_SITE_OTHER): Payer: Medicare Other | Admitting: Family Medicine

## 2020-02-23 VITALS — BP 150/63 | HR 51 | Temp 98.4°F | Resp 16 | Ht 70.0 in | Wt 194.8 lb

## 2020-02-23 DIAGNOSIS — I10 Essential (primary) hypertension: Secondary | ICD-10-CM

## 2020-02-23 MED ORDER — CHLORTHALIDONE 25 MG PO TABS: 25.0000 mg | ORAL_TABLET | Freq: Every day | ORAL | 1 refills | Status: DC

## 2020-02-23 NOTE — Progress Notes (Signed)
OFFICE VISIT  02/23/2020   CC:  Chief Complaint  Patient presents with  . Follow-up    hypertension, pt is fasting   HPI:    Patient is a 86 y.o. Caucasian male with a PMH of HTN, HLD, CRI II, hx of IDA from recurrent upper GI bleed, arthritis, and GERD who presents today for f/u uncontrolled HTN.  A/P as of last visit: "HTN: double dose of clonidine - take one in the afternoon and one before bed (0.1mg  pill) and continue rest of meds as is.  INTERIM HX: All labs normal last visit. He was to continue home bp monitoring, bring in for review today. These show 140s-170s syst, 80s diast. No low bps.  Feeling pretty good. Getting his usual 4 hours of sleep most nights.  Past Medical History:  Diagnosis Date  . Arthritis   . Ataxia 2014/15   brain MRI 02/2014 showed encephalomalacia parietal and occip lobes c/w old infarcts, plus diffuse cerebral atrophy, o/w normal  . Cataract   . Chronic renal insufficiency, stage 2 (mild) 12/2016   GFR 60s-70s  . DDD (degenerative disc disease)    L-spine  . Dermatitis    ?Contact derm? per [facility] MD: Dr. Threasa Alpha (05/09/16): punch bx 06/18/16: chronic eczematous derm/contact derm, neg fungal stain.  Allergy (RAST) testing NEG.  . GERD (gastroesophageal reflux disease)   . Heme positive stool 02/2012   colonoscopy normal.  EGD showed small hiatal hernia with associated Cameron's erosions.  Hemoccults NEG x 3 09/2014. Hem + iron def 07/2019->conservative mgmt.  . Hyperlipidemia   . Hypertension   . Iron deficiency anemia 02/2012; 07/2019   2013 Mild iron deficiency->small upper GI erosions, colonoscopy normal.  07/2019->Hb 7.5, heme+, GI eval->pt chose no endoscopy->IV iron and Hb monitoring, PO iron indefinitely: Hb/iron normal on recheck 10/2019.  . Macular degeneration, age related    exudative R eye; nonexudative L eye.  Vitreous degeneration bilat.  . Obesity (BMI 30-39.9)   . Retinal hemorrhage 2016   w/ retinal detachment (right  eye) : anticoagulants stopped due to this  . Venous insufficiency of both lower extremities     Past Surgical History:  Procedure Laterality Date  . APPENDECTOMY  1935  . cataract surg     Bilateral/ 08-13  . COLONOSCOPY  05/12/12   for iron def anemia--NORMAL RESULT  . ESOPHAGOGASTRODUODENOSCOPY  06/17/12   Small hiatal hernia with associated Cameron's erosions--likely source of his mild iron def anemia.  Marland Kitchen NASAL SINUS SURGERY     1998  . TONSILLECTOMY  1935    Outpatient Medications Prior to Visit  Medication Sig Dispense Refill  . Aflibercept (EYLEA IO) Inject into the eye. One injection into right eye every month    . cetirizine (ZYRTEC) 10 MG tablet Take 10 mg by mouth daily.  5  . Cholecalciferol (VITAMIN D3) 2000 UNITS TABS Take by mouth daily.      . cloNIDine (CATAPRES) 0.1 MG tablet TAKE 1 TABLET BY MOUTH TWICE A DAY 60 tablet 0  . ipratropium (ATROVENT) 0.03 % nasal spray 1-2 SPRAYS IN EACH NOSTRIL ABOUT 10 MINUTES PRIOR TO EACH MEAL ( 4 TIMES A DAY ) 30 mL 5  . losartan (COZAAR) 100 MG tablet TAKE 1 TABLET BY MOUTH EVERY DAY 90 tablet 1  . Multiple Vitamins-Minerals (PRESERVISION AREDS 2 PO) Take 1 tablet by mouth 2 (two) times daily.     . multivitamin (THERAGRAN) per tablet Take 1 tablet by mouth daily.      Marland Kitchen  omeprazole (PRILOSEC) 40 MG capsule Take 1 tablet by mouth daily. 90 capsule 1  . tobramycin (TOBREX) 0.3 % ophthalmic solution Place 1 drop into the right eye. Four times daily begin 1 day prior to treatment  5  . verapamil (CALAN-SR) 240 MG CR tablet TAKE 1 TABLET (240 MG TOTAL) BY MOUTH 2 (TWO) TIMES DAILY. 180 tablet 1   No facility-administered medications prior to visit.    No Known Allergies  ROS As per HPI  PE: Vitals with BMI 02/23/2020 01/21/2020 01/10/2020  Height 5\' 10"  5\' 10"  5\' 10"   Weight 194 lbs 13 oz 188 lbs 13 oz 190 lbs 6 oz  BMI 27.95 0000000 Q000111Q  Systolic Q000111Q Q000111Q [id number]  Diastolic 63 66 77  Pulse 51 61 51    Gen: Alert, well  appearing.  Patient is oriented to person, place, time, and situation. AFFECT: pleasant, lucid thought and speech. No further exam today.  LABS:    Chemistry      Component Value Date/Time   NA 140 01/10/2020 1401   K 4.3 01/10/2020 1401   CL 104 01/10/2020 1401   CO2 28 01/10/2020 1401   BUN 20 01/10/2020 1401   CREATININE 0.81 01/10/2020 1401      Component Value Date/Time   CALCIUM 9.3 01/10/2020 1401   ALKPHOS 80 07/08/2019 1411   AST 11 07/08/2019 1411   ALT 9 07/08/2019 1411   BILITOT 0.3 07/08/2019 1411     Lab Results  Component Value Date   WBC 5.9 01/10/2020   HGB 13.3 01/10/2020   HCT 39.5 01/10/2020   MCV 100.6 (H) 01/10/2020   PLT 363.0 01/10/2020    IMPRESSION AND PLAN:  Uncontrolled HTN. Slight improvement on current regimen. Continue the clonidine 0.1 bid (this also his helping some with sleep) and losartan 100mg  qd. Add chlorthalidone 25mg  qd today (was on ARB-hctz combo pill up until early 2020 and unclear why he got off this and onto losartan by itself.  No allergy or intol listed for hctz. ? Change due to temporary lack of supply at pharmacy vs insurance coverage issue.  Pt does not recall whether he had any side effect/intol/allergy issue from hctz). BMET 1 wk. Continue home bp monitoring and f/u to review these in office in 1 mo.  FOLLOW UP: Return in about 4 weeks (around 03/22/2020) for 1 mo f/u HTN but needs BMET in 1 wk.  An After Visit Summary was printed and given to the patient.

## 2020-02-24 DIAGNOSIS — H353211 Exudative age-related macular degeneration, right eye, with active choroidal neovascularization: Secondary | ICD-10-CM | POA: Diagnosis not present

## 2020-02-24 DIAGNOSIS — H43813 Vitreous degeneration, bilateral: Secondary | ICD-10-CM | POA: Diagnosis not present

## 2020-02-24 DIAGNOSIS — H35372 Puckering of macula, left eye: Secondary | ICD-10-CM | POA: Diagnosis not present

## 2020-02-24 DIAGNOSIS — H353122 Nonexudative age-related macular degeneration, left eye, intermediate dry stage: Secondary | ICD-10-CM | POA: Diagnosis not present

## 2020-02-29 ENCOUNTER — Ambulatory Visit: Payer: Medicare Other

## 2020-03-01 ENCOUNTER — Ambulatory Visit (INDEPENDENT_AMBULATORY_CARE_PROVIDER_SITE_OTHER): Payer: Medicare Other | Admitting: Family Medicine

## 2020-03-01 ENCOUNTER — Other Ambulatory Visit: Payer: Self-pay

## 2020-03-01 DIAGNOSIS — I10 Essential (primary) hypertension: Secondary | ICD-10-CM

## 2020-03-01 LAB — BASIC METABOLIC PANEL
BUN: 23 mg/dL (ref 6–23)
CO2: 30 mEq/L (ref 19–32)
Calcium: 9.5 mg/dL (ref 8.4–10.5)
Chloride: 103 mEq/L (ref 96–112)
Creatinine, Ser: 1.11 mg/dL (ref 0.40–1.50)
GFR: 62.15 mL/min (ref >60.00)
Glucose, Bld: 103 mg/dL — ABNORMAL HIGH (ref 70–99)
Potassium: 4.1 mEq/L (ref 3.5–5.1)
Sodium: 139 mEq/L (ref 135–145)

## 2020-03-02 ENCOUNTER — Other Ambulatory Visit: Payer: Self-pay

## 2020-03-02 DIAGNOSIS — R7989 Other specified abnormal findings of blood chemistry: Secondary | ICD-10-CM

## 2020-03-02 DIAGNOSIS — I10 Essential (primary) hypertension: Secondary | ICD-10-CM

## 2020-03-09 ENCOUNTER — Other Ambulatory Visit: Payer: Self-pay

## 2020-03-09 ENCOUNTER — Ambulatory Visit (INDEPENDENT_AMBULATORY_CARE_PROVIDER_SITE_OTHER): Payer: Medicare Other

## 2020-03-09 DIAGNOSIS — I10 Essential (primary) hypertension: Secondary | ICD-10-CM | POA: Diagnosis not present

## 2020-03-09 DIAGNOSIS — R7989 Other specified abnormal findings of blood chemistry: Secondary | ICD-10-CM

## 2020-03-09 LAB — BASIC METABOLIC PANEL
BUN: 24 mg/dL — ABNORMAL HIGH (ref 6–23)
CO2: 30 mEq/L (ref 19–32)
Calcium: 9.5 mg/dL (ref 8.4–10.5)
Chloride: 100 mEq/L (ref 96–112)
Creatinine, Ser: 0.98 mg/dL (ref 0.40–1.50)
GFR: 71.76 mL/min (ref >60.00)
Glucose, Bld: 109 mg/dL — ABNORMAL HIGH (ref 70–99)
Potassium: 3.6 mEq/L (ref 3.5–5.1)
Sodium: 137 mEq/L (ref 135–145)

## 2020-03-11 ENCOUNTER — Other Ambulatory Visit: Payer: Self-pay | Admitting: Family Medicine

## 2020-03-18 ENCOUNTER — Other Ambulatory Visit: Payer: Self-pay | Admitting: Family Medicine

## 2020-03-22 ENCOUNTER — Other Ambulatory Visit: Payer: Self-pay

## 2020-03-22 ENCOUNTER — Ambulatory Visit (INDEPENDENT_AMBULATORY_CARE_PROVIDER_SITE_OTHER): Payer: Medicare Other | Admitting: Family Medicine

## 2020-03-22 ENCOUNTER — Encounter: Payer: Self-pay | Admitting: Family Medicine

## 2020-03-22 VITALS — BP 166/83 | HR 64 | Temp 98.1°F | Resp 16 | Ht 70.0 in | Wt 189.4 lb

## 2020-03-22 DIAGNOSIS — I10 Essential (primary) hypertension: Secondary | ICD-10-CM | POA: Diagnosis not present

## 2020-03-22 NOTE — Progress Notes (Signed)
OFFICE VISIT  03/22/2020   CC:  Chief Complaint  Patient presents with  . Follow-up    HTN, pt is fasting   HPI:    Patient is a 86 y.o. Caucasian male with a PMH of HTN, HLD, CRI II,hx of IDA from recurrent upper GI bleed, arthritis, and GERDwho presents for 1 mo f/u uncontrolled HTN.    A/P as of last visit: "Uncontrolled HTN. Slight improvement on current regimen. Continue the clonidine 0.1 bid (this also his helping some with sleep) and losartan 100mg  qd. Add chlorthalidone 25mg  qd today (was on ARB-hctz combo pill up until early 2020 and unclear why he got off this and onto losartan by itself.  No allergy or intol listed for hctz. ? Change due to temporary lack of supply at pharmacy vs insurance coverage issue.  Pt does not recall whether he had any side effect/intol/allergy issue from hctz). BMET 1 wk. Continue home bp monitoring and f/u to review these in office in 1 mo.  INTERIM HX: "I feel fine, just getting older and more tired as I get older"--per his usual statement. Some nights sleep is fine Says he doesn't really have a good sleep hygiene routine.  Home bp monitoring: he forgot to bring his numbers with him today. Most systolics in high 086V.  Never goes over 160. Diastolics 78I or so, says it never goes over 63.  Nothing less than 70.   Past Medical History:  Diagnosis Date  . Arthritis   . Ataxia 2014/15   brain MRI 02/2014 showed encephalomalacia parietal and occip lobes c/w old infarcts, plus diffuse cerebral atrophy, o/w normal  . Cataract   . Chronic renal insufficiency, stage 2 (mild) 12/2016   GFR 60s-70s  . DDD (degenerative disc disease)    L-spine  . Dermatitis    ?Contact derm? per [facility] MD: Dr. Threasa Alpha (05/09/16): punch bx 06/18/16: chronic eczematous derm/contact derm, neg fungal stain.  Allergy (RAST) testing NEG.  . GERD (gastroesophageal reflux disease)   . Heme positive stool 02/2012   colonoscopy normal.  EGD showed small hiatal  hernia with associated Cameron's erosions.  Hemoccults NEG x 3 09/2014. Hem + iron def 07/2019->conservative mgmt.  . Hyperlipidemia   . Hypertension   . Iron deficiency anemia 02/2012; 07/2019   2013 Mild iron deficiency->small upper GI erosions, colonoscopy normal.  07/2019->Hb 7.5, heme+, GI eval->pt chose no endoscopy->IV iron and Hb monitoring, PO iron indefinitely: Hb/iron normal on recheck 10/2019.  . Macular degeneration, age related    exudative R eye; nonexudative L eye.  Vitreous degeneration bilat.  . Obesity (BMI 30-39.9)   . Retinal hemorrhage 2016   w/ retinal detachment (right eye) : anticoagulants stopped due to this  . Venous insufficiency of both lower extremities     Past Surgical History:  Procedure Laterality Date  . APPENDECTOMY  1935  . cataract surg     Bilateral/ 08-13  . COLONOSCOPY  05/12/12   for iron def anemia--NORMAL RESULT  . ESOPHAGOGASTRODUODENOSCOPY  06/17/12   Small hiatal hernia with associated Cameron's erosions--likely source of his mild iron def anemia.  Marland Kitchen NASAL SINUS SURGERY     1998  . TONSILLECTOMY  1935    Outpatient Medications Prior to Visit  Medication Sig Dispense Refill  . chlorthalidone (HYGROTON) 25 MG tablet TAKE 1 TABLET BY MOUTH EVERY DAY 30 tablet 1  . Cholecalciferol (VITAMIN D3) 2000 UNITS TABS Take by mouth daily.      . cloNIDine (CATAPRES)  0.1 MG tablet TAKE 1 TABLET BY MOUTH TWICE A DAY 180 tablet 0  . losartan (COZAAR) 100 MG tablet TAKE 1 TABLET BY MOUTH EVERY DAY 90 tablet 1  . Multiple Vitamins-Minerals (PRESERVISION AREDS 2 PO) Take 1 tablet by mouth 2 (two) times daily.     . multivitamin (THERAGRAN) per tablet Take 1 tablet by mouth daily.      Marland Kitchen omeprazole (PRILOSEC) 40 MG capsule Take 1 tablet by mouth daily. 90 capsule 1  . verapamil (CALAN-SR) 240 MG CR tablet TAKE 1 TABLET (240 MG TOTAL) BY MOUTH 2 (TWO) TIMES DAILY. 180 tablet 1  . Aflibercept (EYLEA IO) Inject into the eye. One injection into right eye every  month (Patient not taking: Reported on 03/22/2020)    . cetirizine (ZYRTEC) 10 MG tablet Take 10 mg by mouth daily. (Patient not taking: Reported on 03/22/2020)  5  . ipratropium (ATROVENT) 0.03 % nasal spray 1-2 SPRAYS IN EACH NOSTRIL ABOUT 10 MINUTES PRIOR TO EACH MEAL ( 4 TIMES A DAY ) (Patient not taking: Reported on 03/22/2020) 30 mL 5  . tobramycin (TOBREX) 0.3 % ophthalmic solution Place 1 drop into the right eye. Four times daily begin 1 day prior to treatment (Patient not taking: Reported on 03/22/2020)  5   No facility-administered medications prior to visit.    No Known Allergies  ROS As per HPI  PE: Vitals with BMI 03/22/2020 02/23/2020 01/21/2020  Height 5\' 10"  5\' 10"  5\' 10"   Weight 189 lbs 6 oz 194 lbs 13 oz 188 lbs 13 oz  BMI 27.18 56.38 75.64  Systolic 332 951 884  Diastolic 83 63 66  Pulse 64 51 61  O2 sat today on RA is 97%  Gen: Alert, well appearing.  Patient is oriented to person, place, time, and situation. AFFECT: pleasant, lucid thought and speech. CV: RRR, no m/r/g.   LUNGS: CTA bilat, nonlabored resps, good aeration in all lung fields. EXT: no clubbing or cyanosis.  1+ pitting edema RLL and trace pitting edema L LL.    LABS:    Chemistry      Component Value Date/Time   NA 137 03/09/2020 1009   K 3.6 03/09/2020 1009   CL 100 03/09/2020 1009   CO2 30 03/09/2020 1009   BUN 24 (H) 03/09/2020 1009   CREATININE 0.98 03/09/2020 1009      Component Value Date/Time   CALCIUM 9.5 03/09/2020 1009   ALKPHOS 80 07/08/2019 1411   AST 11 07/08/2019 1411   ALT 9 07/08/2019 1411   BILITOT 0.3 07/08/2019 1411     Lab Results  Component Value Date   CHOL 192 06/30/2017   HDL 57.20 06/30/2017   LDLCALC 104 (H) 06/30/2017   LDLDIRECT 112.5 09/11/2011   TRIG 156.0 (H) 06/30/2017   CHOLHDL 3 06/30/2017   Lab Results  Component Value Date   WBC 5.9 01/10/2020   HGB 13.3 01/10/2020   HCT 39.5 01/10/2020   MCV 100.6 (H) 01/10/2020   PLT 363.0 01/10/2020    Lab Results  Component Value Date   IRON 102 10/13/2019   TIBC 315 10/13/2019   FERRITIN 34 10/13/2019   Lab Results  Component Value Date   TSH 2.44 07/08/2019   Lab Results  Component Value Date   HGBA1C 5.6 01/10/2020   Lab Results  Component Value Date   VITAMINB12 351 10/13/2019    IMPRESSION AND PLAN:  1) HTN: at goal for him, which is around 140/80. Continue clonidine 0.1mg   bid, chlorthalidone 25mg  qd, cozaar 100 mg qd, and calan SR 240 mg bid. Lytes/cr stable when checked after the recent addition of chlorthalidone.  He is at baseline for memory/cognition/sleep issues. Needs to work hard on sleep hygiene but I doubt he will do this b/c he is pretty set in his ways.  He lives with his son still right now but his son will soon be moving to a home in [place] so it will be interesting to see how Talan does with this transition.  An After Visit Summary was printed and given to the patient.  FOLLOW UP: Return in about 3 months (around 06/22/2020) for routine chronic illness f/u.  Signed:  Crissie Sickles, MD           03/22/2020

## 2020-04-17 ENCOUNTER — Other Ambulatory Visit: Payer: Self-pay | Admitting: Family Medicine

## 2020-04-18 ENCOUNTER — Other Ambulatory Visit: Payer: Self-pay | Admitting: Family Medicine

## 2020-05-04 DIAGNOSIS — H353211 Exudative age-related macular degeneration, right eye, with active choroidal neovascularization: Secondary | ICD-10-CM | POA: Diagnosis not present

## 2020-05-04 DIAGNOSIS — H353122 Nonexudative age-related macular degeneration, left eye, intermediate dry stage: Secondary | ICD-10-CM | POA: Diagnosis not present

## 2020-05-15 ENCOUNTER — Other Ambulatory Visit: Payer: Self-pay | Admitting: Family Medicine

## 2020-05-22 ENCOUNTER — Other Ambulatory Visit: Payer: Self-pay | Admitting: Family Medicine

## 2020-06-20 ENCOUNTER — Other Ambulatory Visit: Payer: Self-pay | Admitting: Family Medicine

## 2020-06-22 ENCOUNTER — Encounter: Payer: Self-pay | Admitting: Family Medicine

## 2020-06-22 ENCOUNTER — Other Ambulatory Visit: Payer: Self-pay

## 2020-06-22 ENCOUNTER — Ambulatory Visit (INDEPENDENT_AMBULATORY_CARE_PROVIDER_SITE_OTHER): Payer: Medicare Other | Admitting: Family Medicine

## 2020-06-22 VITALS — BP 156/80 | HR 61 | Temp 97.7°F | Resp 18 | Ht 70.0 in | Wt 193.5 lb

## 2020-06-22 DIAGNOSIS — Z8719 Personal history of other diseases of the digestive system: Secondary | ICD-10-CM | POA: Diagnosis not present

## 2020-06-22 DIAGNOSIS — I10 Essential (primary) hypertension: Secondary | ICD-10-CM

## 2020-06-22 DIAGNOSIS — Z862 Personal history of diseases of the blood and blood-forming organs and certain disorders involving the immune mechanism: Secondary | ICD-10-CM | POA: Diagnosis not present

## 2020-06-22 DIAGNOSIS — R600 Localized edema: Secondary | ICD-10-CM

## 2020-06-22 DIAGNOSIS — R2681 Unsteadiness on feet: Secondary | ICD-10-CM | POA: Diagnosis not present

## 2020-06-22 LAB — BASIC METABOLIC PANEL
BUN: 21 mg/dL (ref 6–23)
CO2: 31 mEq/L (ref 19–32)
Calcium: 9.3 mg/dL (ref 8.4–10.5)
Chloride: 100 mEq/L (ref 96–112)
Creatinine, Ser: 0.97 mg/dL (ref 0.40–1.50)
GFR: 72.56 mL/min (ref >60.00)
Glucose, Bld: 95 mg/dL (ref 70–99)
Potassium: 3.5 mEq/L (ref 3.5–5.1)
Sodium: 139 mEq/L (ref 135–145)

## 2020-06-22 LAB — CBC
HCT: 39.3 % (ref 39.0–52.0)
Hemoglobin: 13.5 g/dL (ref 13.0–17.0)
MCHC: 34.4 g/dL (ref 30.0–36.0)
MCV: 99.8 fl (ref 78.0–100.0)
Platelets: 358 10*3/uL (ref 150.0–400.0)
RBC: 3.94 Mil/uL — ABNORMAL LOW (ref 4.22–5.81)
RDW: 14.4 % (ref 11.5–15.5)
WBC: 6 10*3/uL (ref 4.0–10.5)

## 2020-06-22 NOTE — Progress Notes (Signed)
OFFICE VISIT  06/22/2020  CC:  Chief Complaint  Patient presents with  . Follow-up    Fasting    HPI:    Patient is a 86 y.o. Caucasian male who presents for 3 mo f/u HTN, venous insufficiency edema both LL's, and hx of IDA from recurrent upper GI bleed. He also has problems with chronic osteoarthritis multiple sites as well as GERD.  A/P as of last visit: "1) HTN: at goal for him, which is around 140/80. Continue clonidine 0.1mg  bid, chlorthalidone 25mg  qd, cozaar 100 mg qd, and calan SR 240 mg bid. Lytes/cr stable when checked after the recent addition of chlorthalidone.  He is at baseline for memory/cognition/sleep issues. Needs to work hard on sleep hygiene but I doubt he will do this b/c he is pretty set in his ways.  He lives with his son still right now but his son will soon be moving to a home in [place] so it will be interesting to see how [place] does with this transition."  INTERIM HX: Feeling ok as usual. Just some walking instability due to knees, no pain in knees though. He doesn't use a cane or walker ever.  No recent falls.   Pt IS taking PO iron tab once daily.  Stool is dark but does not fit melena description.  HOme bp monitoring typically 140s over 70s or so.   Visited his stock broker this morning, everything "safe", but was nervous so this may explain bp up here today.  Has his typical poor sleep hygiene and sleep routine---says he still feels pretty good. Appetite is great He is almost ready to move to new home with his son in [place].  No change in LE swelling.  He doesn't really limit sodium in his diet.  ROS: no fevers, no CP, no SOB, no wheezing, no cough, no dizziness, no HAs, no rashes, no melena/hematochezia.  No polyuria or polydipsia.  No myalgias or arthralgias.  No focal weakness, paresthesias, or tremors.  No acute vision or hearing abnormalities. No n/v/d or abd pain.  No palpitations.     Past Medical History:  Diagnosis Date  .  Arthritis   . Ataxia 2014/15   brain MRI 02/2014 showed encephalomalacia parietal and occip lobes c/w old infarcts, plus diffuse cerebral atrophy, o/w normal  . Cataract   . Chronic renal insufficiency, stage 2 (mild) 12/2016   GFR 60s-70s  . DDD (degenerative disc disease)    L-spine  . Dermatitis    ?Contact derm? per [facility] MD: Dr. Threasa Alpha (05/09/16): punch bx 06/18/16: chronic eczematous derm/contact derm, neg fungal stain.  Allergy (RAST) testing NEG.  . GERD (gastroesophageal reflux disease)   . Heme positive stool 02/2012   colonoscopy normal.  EGD showed small hiatal hernia with associated Cameron's erosions.  Hemoccults NEG x 3 09/2014. Hem + iron def 07/2019->conservative mgmt.  . Hyperlipidemia   . Hypertension   . Insomnia    poor sleep hygiene.  Clonidine for his HTN has helped a little.  . Iron deficiency anemia 02/2012; 07/2019   2013 Mild iron deficiency->small upper GI erosions, colonoscopy normal.  07/2019->Hb 7.5, heme+, GI eval->pt chose no endoscopy->IV iron and Hb monitoring, PO iron indefinitely: Hb/iron normal on recheck 10/2019 and 12/2019.  . Macular degeneration, age related    exudative R eye; nonexudative L eye.  Vitreous degeneration bilat.  . Obesity (BMI 30-39.9)   . Retinal hemorrhage 2016   w/ retinal detachment (right eye) : anticoagulants stopped due  to this  . Venous insufficiency of both lower extremities     Past Surgical History:  Procedure Laterality Date  . APPENDECTOMY  1935  . cataract surg     Bilateral/ 08-13  . COLONOSCOPY  05/12/12   for iron def anemia--NORMAL RESULT  . ESOPHAGOGASTRODUODENOSCOPY  06/17/12   Small hiatal hernia with associated Cameron's erosions--likely source of his mild iron def anemia.  Marland Kitchen NASAL SINUS SURGERY     1998  . TONSILLECTOMY  1935    Outpatient Medications Prior to Visit  Medication Sig Dispense Refill  . Aflibercept (EYLEA IO) Inject into the eye. One injection into right eye every month     .  chlorthalidone (HYGROTON) 25 MG tablet TAKE 1 TABLET BY MOUTH EVERY DAY 90 tablet 1  . Cholecalciferol (VITAMIN D3) 2000 UNITS TABS Take by mouth daily.      . cloNIDine (CATAPRES) 0.1 MG tablet TAKE 1 TABLET BY MOUTH TWICE A DAY 180 tablet 0  . ipratropium (ATROVENT) 0.03 % nasal spray PLACE 1-2 SPRAYS IN EACH NOSTRIL ABOUT 10 MINUTES PRIOR TO EACH MEAL 30 mL 1  . losartan (COZAAR) 100 MG tablet TAKE 1 TABLET BY MOUTH EVERY DAY 90 tablet 1  . Multiple Vitamins-Minerals (PRESERVISION AREDS 2 PO) Take 1 tablet by mouth 2 (two) times daily.     . multivitamin (THERAGRAN) per tablet Take 1 tablet by mouth daily.      Marland Kitchen omeprazole (PRILOSEC) 40 MG capsule TAKE 1 CAPSULE BY MOUTH EVERY DAY 90 capsule 1  . tobramycin (TOBREX) 0.3 % ophthalmic solution Place 1 drop into the right eye. Four times daily begin 1 day prior to treatment  5  . verapamil (CALAN-SR) 240 MG CR tablet TAKE 1 TABLET (240 MG TOTAL) BY MOUTH 2 (TWO) TIMES DAILY. 180 tablet 1  . cetirizine (ZYRTEC) 10 MG tablet Take 10 mg by mouth daily. (Patient not taking: Reported on 03/22/2020)  5   No facility-administered medications prior to visit.    No Known Allergies  ROS As per HPI  PE: Vitals with BMI 06/22/2020 03/22/2020 02/23/2020  Height 5\' 10"  5\' 10"  5\' 10"   Weight 193 lbs 8 oz 189 lbs 6 oz 194 lbs 13 oz  BMI 27.76 63.14 97.02  Systolic 637 858 850  Diastolic 80 83 63  Pulse 61 64 51    Gen: Alert, well appearing.  Patient is oriented to person, place, time, and situation. AFFECT: pleasant, lucid thought and speech. CV: RRR, no m/r/g.   LUNGS: CTA bilat, nonlabored resps, good aeration in all lung fields. EXT: no clubbing or cyanosis.  2+ pitting edema on L LL and 3+pitting edema on R LL.    LABS:  Lab Results  Component Value Date   TSH 2.44 07/08/2019   Lab Results  Component Value Date   WBC 5.9 01/10/2020   HGB 13.3 01/10/2020   HCT 39.5 01/10/2020   MCV 100.6 (H) 01/10/2020   PLT 363.0 01/10/2020   Lab  Results  Component Value Date   IRON 102 10/13/2019   TIBC 315 10/13/2019   FERRITIN 34 10/13/2019    Lab Results  Component Value Date   CREATININE 0.98 03/09/2020   BUN 24 (H) 03/09/2020   NA 137 03/09/2020   K 3.6 03/09/2020   CL 100 03/09/2020   CO2 30 03/09/2020   Lab Results  Component Value Date   ALT 9 07/08/2019   AST 11 07/08/2019   ALKPHOS 80 07/08/2019   BILITOT 0.3  07/08/2019   Lab Results  Component Value Date   CHOL 192 06/30/2017   Lab Results  Component Value Date   HDL 57.20 06/30/2017   Lab Results  Component Value Date   LDLCALC 104 (H) 06/30/2017   Lab Results  Component Value Date   TRIG 156.0 (H) 06/30/2017   Lab Results  Component Value Date   CHOLHDL 3 06/30/2017   Lab Results  Component Value Date   HGBA1C 5.6 01/10/2020    IMPRESSION AND PLAN:  1) HTN: at goal for him, which is around 140/80. Continue clonidine 0.1mg  bid, chlorthalidone 25mg  qd, cozaar 100 mg qd, and calan SR 240 mg bid. BMET today.  2) Hist of hem+ IDA. Hb has been stable after iv iron treatment and chronic daily iron tab. No melena or BRBPR. No abd pains or wt loss. CBC and iron panel today.  3) LE venous insufficiency edema: stable. Encouraged him to continue to try to limit sodium intake and also elevate legs above level of head 20 min 1-2 x/day. He has some compression hose but doesn't wear them.  4)Gait instability: due to bad knee DJD, but no falls and he has no pain. Encouraged him to get a cane to use for balance.  He said he'll consider this.  An After Visit Summary was printed and given to the patient.  FOLLOW UP: Return in about 3 months (around 09/21/2020) for routine chronic illness f/u.  Signed:  Crissie Sickles, MD           06/22/2020

## 2020-06-23 LAB — IRON,TIBC AND FERRITIN PANEL
%SAT: 28 % (calc) (ref 20–48)
Ferritin: 52 ng/mL (ref 24–380)
Iron: 86 ug/dL (ref 50–180)
TIBC: 307 mcg/dL (calc) (ref 250–425)

## 2020-07-08 ENCOUNTER — Other Ambulatory Visit: Payer: Self-pay | Admitting: Family Medicine

## 2020-07-08 DIAGNOSIS — I10 Essential (primary) hypertension: Secondary | ICD-10-CM

## 2020-07-15 ENCOUNTER — Other Ambulatory Visit: Payer: Self-pay | Admitting: Family Medicine

## 2020-07-27 DIAGNOSIS — H43813 Vitreous degeneration, bilateral: Secondary | ICD-10-CM | POA: Diagnosis not present

## 2020-07-27 DIAGNOSIS — H35372 Puckering of macula, left eye: Secondary | ICD-10-CM | POA: Diagnosis not present

## 2020-07-27 DIAGNOSIS — H353122 Nonexudative age-related macular degeneration, left eye, intermediate dry stage: Secondary | ICD-10-CM | POA: Diagnosis not present

## 2020-07-27 DIAGNOSIS — H353211 Exudative age-related macular degeneration, right eye, with active choroidal neovascularization: Secondary | ICD-10-CM | POA: Diagnosis not present

## 2020-09-25 ENCOUNTER — Other Ambulatory Visit: Payer: Self-pay

## 2020-09-25 ENCOUNTER — Ambulatory Visit (INDEPENDENT_AMBULATORY_CARE_PROVIDER_SITE_OTHER): Payer: Medicare Other | Admitting: Family Medicine

## 2020-09-25 ENCOUNTER — Encounter: Payer: Self-pay | Admitting: Family Medicine

## 2020-09-25 VITALS — BP 130/73 | HR 61 | Temp 97.4°F | Resp 16 | Ht 70.0 in | Wt 195.4 lb

## 2020-09-25 DIAGNOSIS — I10 Essential (primary) hypertension: Secondary | ICD-10-CM | POA: Diagnosis not present

## 2020-09-25 DIAGNOSIS — I872 Venous insufficiency (chronic) (peripheral): Secondary | ICD-10-CM | POA: Diagnosis not present

## 2020-09-25 DIAGNOSIS — Z862 Personal history of diseases of the blood and blood-forming organs and certain disorders involving the immune mechanism: Secondary | ICD-10-CM | POA: Diagnosis not present

## 2020-09-25 LAB — CBC
HCT: 40.9 % (ref 39.0–52.0)
Hemoglobin: 13.8 g/dL (ref 13.0–17.0)
MCHC: 33.7 g/dL (ref 30.0–36.0)
MCV: 99.1 fl (ref 78.0–100.0)
Platelets: 377 10*3/uL (ref 150.0–400.0)
RBC: 4.13 Mil/uL — ABNORMAL LOW (ref 4.22–5.81)
RDW: 14.1 % (ref 11.5–15.5)
WBC: 6.8 10*3/uL (ref 4.0–10.5)

## 2020-09-25 LAB — BASIC METABOLIC PANEL
BUN: 19 mg/dL (ref 6–23)
CO2: 31 mEq/L (ref 19–32)
Calcium: 9.4 mg/dL (ref 8.4–10.5)
Chloride: 100 mEq/L (ref 96–112)
Creatinine, Ser: 1 mg/dL (ref 0.40–1.50)
GFR: 65.96 mL/min (ref >60.00)
Glucose, Bld: 101 mg/dL — ABNORMAL HIGH (ref 70–99)
Potassium: 3.8 mEq/L (ref 3.5–5.1)
Sodium: 139 mEq/L (ref 135–145)

## 2020-09-25 NOTE — Progress Notes (Signed)
OFFICE VISIT  09/25/2020  CC:  Chief Complaint  Patient presents with  . Follow-up    RCI, pt is not fasting    HPI:    Patient is a 86 y.o. Caucasian male who presents for 3 mo f/u HTN, hx of IDA, LE venous insuff edema, and gait instability. A/P as of last visit: "1) HTN: at goal for him, which is around 140/80. Continue clonidine 0.1mg  bid, chlorthalidone 25mg  qd, cozaar 100 mg qd, and calan SR 240 mg bid. BMET today.  2) Hist of hem+ IDA. Hb has been stable after iv iron treatment and chronic daily iron tab. No melena or BRBPR. No abd pains or wt loss. CBC and iron panel today.  3) LE venous insufficiency edema: stable. Encouraged him to continue to try to limit sodium intake and also elevate legs above level of head 20 min 1-2 x/day. He has some compression hose but doesn't wear them.  4)Gait instability: due to bad knee DJD, but no falls and he has no pain. Encouraged him to get a cane to use for balance.  He said he'll consider this."  INTERIM HX: Doing fine, no complaints.  He is settled into his home in [place] with his son.  Compliant with bp meds, monitors bp regularly, typically 120-130s syst. Takes PPI qd as well as daily iron tab.   Says his stool has always been very dark since taking iron supplement.  Does not describe melena, though. No falls, no dizziness.   His LE swelling is not much changed.  He once again states he loves to eat and eats w/out regard to sodium content of foods.  ROS: no fevers, no CP, no SOB, no wheezing, no cough, no dizziness, no HAs, no rashes, no melena/hematochezia.  No polyuria or polydipsia.  No myalgias or arthralgias.  No focal weakness, paresthesias, or tremors.  No acute vision or hearing abnormalities. Some nausea w/out vomiting most days---he attributes this to taking his omeprazole daily. No abd pain.  No palpitations.     Past Medical History:  Diagnosis Date  . Arthritis   . Ataxia 2014/15   brain MRI 02/2014  showed encephalomalacia parietal and occip lobes c/w old infarcts, plus diffuse cerebral atrophy, o/w normal  . Cataract   . Chronic renal insufficiency, stage 2 (mild) 12/2016   GFR 60s-70s  . DDD (degenerative disc disease)    L-spine  . Dermatitis    ?Contact derm? per [facility] MD: Dr. Threasa Alpha (05/09/16): punch bx 06/18/16: chronic eczematous derm/contact derm, neg fungal stain.  Allergy (RAST) testing NEG.  . GERD (gastroesophageal reflux disease)   . Heme positive stool 02/2012   colonoscopy normal.  EGD showed small hiatal hernia with associated Cameron's erosions.  Hemoccults NEG x 3 09/2014. Hem + iron def 07/2019->conservative mgmt.  . Hyperlipidemia   . Hypertension   . Insomnia    poor sleep hygiene.  Clonidine for his HTN has helped a little.  . Iron deficiency anemia 02/2012; 07/2019   2013 Mild iron deficiency->small upper GI erosions, colonoscopy normal.  07/2019->Hb 7.5, heme+, GI eval->pt chose no endoscopy->IV iron and Hb monitoring, PO iron indefinitely: Hb/iron normal on recheck 10/2019 and 12/2019.  . Macular degeneration, age related    exudative R eye; nonexudative L eye.  Vitreous degeneration bilat.  . Obesity (BMI 30-39.9)   . Retinal hemorrhage 2016   w/ retinal detachment (right eye) : anticoagulants stopped due to this  . Venous insufficiency of both lower extremities  Past Surgical History:  Procedure Laterality Date  . APPENDECTOMY  1935  . cataract surg     Bilateral/ 08-13  . COLONOSCOPY  05/12/12   for iron def anemia--NORMAL RESULT  . ESOPHAGOGASTRODUODENOSCOPY  06/17/12   Small hiatal hernia with associated Cameron's erosions--likely source of his mild iron def anemia.  Marland Kitchen NASAL SINUS SURGERY     1998  . TONSILLECTOMY  1935    Outpatient Medications Prior to Visit  Medication Sig Dispense Refill  . Aflibercept (EYLEA IO) Inject into the eye. One injection into right eye every month     . chlorthalidone (HYGROTON) 25 MG tablet TAKE 1  TABLET BY MOUTH EVERY DAY 90 tablet 1  . Cholecalciferol (VITAMIN D3) 2000 UNITS TABS Take by mouth daily.    . cloNIDine (CATAPRES) 0.1 MG tablet TAKE 1 TABLET BY MOUTH TWICE A DAY 180 tablet 0  . ipratropium (ATROVENT) 0.03 % nasal spray PLACE 1-2 SPRAYS IN EACH NOSTRIL ABOUT 10 MINUTES PRIOR TO EACH MEAL 30 mL 1  . losartan (COZAAR) 100 MG tablet TAKE 1 TABLET BY MOUTH EVERY DAY 90 tablet 1  . Multiple Vitamins-Minerals (PRESERVISION AREDS 2 PO) Take 1 tablet by mouth 2 (two) times daily.     . multivitamin (THERAGRAN) per tablet Take 1 tablet by mouth daily.    Marland Kitchen omeprazole (PRILOSEC) 40 MG capsule TAKE 1 CAPSULE BY MOUTH EVERY DAY 90 capsule 1  . tobramycin (TOBREX) 0.3 % ophthalmic solution Place 1 drop into the right eye. Four times daily begin 1 day prior to treatment  5  . verapamil (CALAN-SR) 240 MG CR tablet TAKE 1 TABLET (240 MG TOTAL) BY MOUTH 2 (TWO) TIMES DAILY. 180 tablet 1  . cetirizine (ZYRTEC) 10 MG tablet Take 10 mg by mouth daily. (Patient not taking: No sig reported)  5   No facility-administered medications prior to visit.    No Known Allergies  ROS As per HPI  PE: Vitals with BMI 09/25/2020 06/22/2020 03/22/2020  Height 5\' 10"  5\' 10"  5\' 10"   Weight 195 lbs 6 oz 193 lbs 8 oz 189 lbs 6 oz  BMI 28.04 27.76 27.18  Systolic 130 156  Diastolic 73 80 83  Pulse 61 61 64     Gen: Alert, well appearing.  Patient is oriented to person, place, time, and situation. AFFECT: pleasant, lucid thought and speech. CV: RRR, 1-2 syst murmur at RUSB, rad to R infraclav area, no r/g.   LUNGS: CTA bilat, nonlabored resps, good aeration in all lung fields. EXT: 2+ bilat LL pitting edema from mid tibia level down into feet bilat. Some chronic pigmentation changes to LL's but no erythema or skin breakdown  LABS:  Lab Results  Component Value Date   HGBA1C 5.6 01/10/2020     Chemistry      Component Value Date/Time   NA 139 06/22/2020 1202   K 3.5 06/22/2020 1202   CL  100 06/22/2020 1202   CO2 31 06/22/2020 1202   BUN 21 06/22/2020 1202   CREATININE 0.97 06/22/2020 1202      Component Value Date/Time   CALCIUM 9.3 06/22/2020 1202   ALKPHOS 80 07/08/2019 1411   AST 11 07/08/2019 1411   ALT 9 07/08/2019 1411   BILITOT 0.3 07/08/2019 1411     Lab Results  Component Value Date   WBC 6.0 06/22/2020   HGB 13.5 06/22/2020   HCT 39.3 06/22/2020   MCV 99.8 06/22/2020   PLT 358.0 06/22/2020   Lab  Results  Component Value Date   IRON 86 06/22/2020   TIBC 307 06/22/2020   FERRITIN 52 06/22/2020   Lab Results  Component Value Date   CHOL 192 06/30/2017   HDL 57.20 06/30/2017   LDLCALC 104 (H) 06/30/2017   LDLDIRECT 112.5 09/11/2011   TRIG 156.0 (H) 06/30/2017   CHOLHDL 3 06/30/2017    IMPRESSION AND PLAN:  1) HTN: stable.  Cont clonidine 0.1 bid, chlorthal 25mg  qd, losart 100 qd, calan SR 240 qd. Lytes/cr today.  2) Hx of hemoccult+ IDA: 2013 EGD with cameron erosion, o/w EGD and colonoscopy w/out any explanation for occult bleeding.  Routine monitoring of iron and cbc's have been normal while on oral iron supp. Cont daily iron + daily PPI. He states he can deal with the brief nausea that he attributes to his omeprazole. CBC today.  3) LE venous insuff edema: stable. Reiterated the importance of low Na diet, keep active with walking, elevation of legs prn.  An After Visit Summary was printed and given to the patient.  FOLLOW UP: Return in about 3 months (around 12/24/2020) for routine chronic illness f/u.  Signed:  Crissie Sickles, MD           09/25/2020

## 2020-10-01 ENCOUNTER — Other Ambulatory Visit: Payer: Self-pay | Admitting: Family Medicine

## 2020-10-03 NOTE — Telephone Encounter (Signed)
Yes, okay to change hygroton 25mg  to hctz 50 mg. Erx sent.

## 2020-10-03 NOTE — Telephone Encounter (Signed)
Please advise if change is appropriate

## 2020-10-05 DIAGNOSIS — H35372 Puckering of macula, left eye: Secondary | ICD-10-CM | POA: Diagnosis not present

## 2020-10-05 DIAGNOSIS — H353122 Nonexudative age-related macular degeneration, left eye, intermediate dry stage: Secondary | ICD-10-CM | POA: Diagnosis not present

## 2020-10-05 DIAGNOSIS — H4423 Degenerative myopia, bilateral: Secondary | ICD-10-CM | POA: Diagnosis not present

## 2020-10-05 DIAGNOSIS — H353211 Exudative age-related macular degeneration, right eye, with active choroidal neovascularization: Secondary | ICD-10-CM | POA: Diagnosis not present

## 2020-10-17 DIAGNOSIS — L570 Actinic keratosis: Secondary | ICD-10-CM | POA: Diagnosis not present

## 2020-10-17 DIAGNOSIS — I872 Venous insufficiency (chronic) (peripheral): Secondary | ICD-10-CM | POA: Diagnosis not present

## 2020-10-17 DIAGNOSIS — Z85828 Personal history of other malignant neoplasm of skin: Secondary | ICD-10-CM | POA: Diagnosis not present

## 2020-10-20 ENCOUNTER — Other Ambulatory Visit: Payer: Self-pay | Admitting: Family Medicine

## 2020-11-19 ENCOUNTER — Other Ambulatory Visit: Payer: Self-pay | Admitting: Family Medicine

## 2020-12-14 DIAGNOSIS — H353122 Nonexudative age-related macular degeneration, left eye, intermediate dry stage: Secondary | ICD-10-CM | POA: Diagnosis not present

## 2020-12-14 DIAGNOSIS — H353211 Exudative age-related macular degeneration, right eye, with active choroidal neovascularization: Secondary | ICD-10-CM | POA: Diagnosis not present

## 2020-12-14 DIAGNOSIS — H35372 Puckering of macula, left eye: Secondary | ICD-10-CM | POA: Diagnosis not present

## 2020-12-14 DIAGNOSIS — H35412 Lattice degeneration of retina, left eye: Secondary | ICD-10-CM | POA: Diagnosis not present

## 2020-12-18 ENCOUNTER — Other Ambulatory Visit: Payer: Self-pay | Admitting: Family Medicine

## 2020-12-18 NOTE — Telephone Encounter (Signed)
I will have to resend in.  So you want Diovan 320mg  once a day, #30? How many refills?

## 2020-12-18 NOTE — Telephone Encounter (Signed)
Losartan is on back order and pharmacy would like to change medication.  They put Diovan as alternative.  Pharmacy comment: Product Backordered/Unavailable:LOSARTAN IS ON LONG TERM BACK ORDER. PLEASE SEND NEW RX FOR ALTERNATIVE THERAPY. [place], [place].

## 2020-12-22 ENCOUNTER — Other Ambulatory Visit: Payer: Self-pay

## 2020-12-25 ENCOUNTER — Ambulatory Visit (INDEPENDENT_AMBULATORY_CARE_PROVIDER_SITE_OTHER): Payer: Medicare Other | Admitting: Family Medicine

## 2020-12-25 ENCOUNTER — Other Ambulatory Visit: Payer: Self-pay

## 2020-12-25 ENCOUNTER — Encounter: Payer: Self-pay | Admitting: Family Medicine

## 2020-12-25 VITALS — BP 152/69 | HR 71 | Temp 97.6°F | Resp 16 | Ht 70.0 in | Wt 202.6 lb

## 2020-12-25 DIAGNOSIS — I10 Essential (primary) hypertension: Secondary | ICD-10-CM

## 2020-12-25 DIAGNOSIS — Z862 Personal history of diseases of the blood and blood-forming organs and certain disorders involving the immune mechanism: Secondary | ICD-10-CM | POA: Diagnosis not present

## 2020-12-25 DIAGNOSIS — I872 Venous insufficiency (chronic) (peripheral): Secondary | ICD-10-CM | POA: Diagnosis not present

## 2020-12-25 LAB — BASIC METABOLIC PANEL
BUN: 24 mg/dL — ABNORMAL HIGH (ref 6–23)
CO2: 29 mEq/L (ref 19–32)
Calcium: 9.5 mg/dL (ref 8.4–10.5)
Chloride: 98 mEq/L (ref 96–112)
Creatinine, Ser: 0.97 mg/dL (ref 0.40–1.50)
GFR: 68.3 mL/min (ref >60.00)
Glucose, Bld: 97 mg/dL (ref 70–99)
Potassium: 4 mEq/L (ref 3.5–5.1)
Sodium: 137 mEq/L (ref 135–145)

## 2020-12-25 LAB — CBC WITH DIFFERENTIAL/PLATELET
Basophils Absolute: 0 10*3/uL (ref 0.0–0.1)
Basophils Relative: 0.6 % (ref 0.0–3.0)
Eosinophils Absolute: 0.1 10*3/uL (ref 0.0–0.7)
Eosinophils Relative: 2 % (ref 0.0–5.0)
HCT: 40.1 % (ref 39.0–52.0)
Hemoglobin: 13.7 g/dL (ref 13.0–17.0)
Lymphocytes Relative: 9.6 % — ABNORMAL LOW (ref 12.0–46.0)
Lymphs Abs: 0.7 10*3/uL (ref 0.7–4.0)
MCHC: 34.2 g/dL (ref 30.0–36.0)
MCV: 98.5 fl (ref 78.0–100.0)
Monocytes Absolute: 0.8 10*3/uL (ref 0.1–1.0)
Monocytes Relative: 11.4 % (ref 3.0–12.0)
Neutro Abs: 5.6 10*3/uL (ref 1.4–7.7)
Neutrophils Relative %: 76.4 % (ref 43.0–77.0)
Platelets: 375 10*3/uL (ref 150.0–400.0)
RBC: 4.07 Mil/uL — ABNORMAL LOW (ref 4.22–5.81)
RDW: 14.3 % (ref 11.5–15.5)
WBC: 7.4 10*3/uL (ref 4.0–10.5)

## 2020-12-25 NOTE — Patient Instructions (Signed)
You should no longer be taking chlorthalidone.  Your current blood pressure medications are hydrochlorothiazide (50mg ), clonidine (0.1mg ), verapamil (240mg ), and valsartan (320mg ).

## 2020-12-25 NOTE — Progress Notes (Signed)
OFFICE VISIT  12/25/2020  CC:  Chief Complaint  Patient presents with  . Follow-up    RCI, 3 mo. Fasting    HPI:    Patient is a 87 y.o. Caucasian male who presents for 3 mo f/u HTN, hx of IDA, and chronic LE edema due to venous insufficiency. A/P as of last visit: "1) HTN: stable.  Cont clonidine 0.1 bid, chlorthal 25mg  qd, losart 100 qd, calan SR 240 qd. Lytes/cr today.  2) Hx of hemoccult+ IDA: 2013 EGD with cameron erosion, o/w EGD and colonoscopy w/out any explanation for occult bleeding.  Routine monitoring of iron and cbc's have been normal while on oral iron supp. Cont daily iron + daily PPI. He states he can deal with the brief nausea that he attributes to his omeprazole. CBC today.  3) LE venous insuff edema: stable. Reiterated the importance of low Na diet, keep active with walking, elevation of legs prn."  INTERIM HX: Overall doing pretty good. Says can't get out and walk as much since move to new home in [place]. Occ knee buckle after prolonged walking. Spends most of day alone at home while son at work. His salt intake is heavy as usual.  Since last visit we've had to change bp meds due to pharmacy shortage, etc--->chlorthalidone 25mg  changed to hctz 50mg  qd.  Most recently I changed his losartan 100mg  qd to valsartan 320mg  qd. He has the valsartan at home but has not changed over yet b/c not out of losartan yet.  BP at home this morning was "150some over 58". Has not taken any bp meds yet today.  ROS as above, plus--> no fevers, no CP, no SOB, no wheezing, no cough, no dizziness, no HAs, no rashes, no melena/hematochezia.  No polyuria or polydipsia.  No myalgias or arthralgias.  No focal weakness, paresthesias, or tremors.  No acute vision or hearing abnormalities.  No dysuria or unusual/new urinary urgency or frequency.  No n/v/d or abd pain.  No palpitations.    Past Medical History:  Diagnosis Date  . Arthritis   . Ataxia 2014/15   brain MRI 02/2014  showed encephalomalacia parietal and occip lobes c/w old infarcts, plus diffuse cerebral atrophy, o/w normal  . Cataract   . Chronic renal insufficiency, stage 2 (mild) 12/2016   GFR 60s-70s  . DDD (degenerative disc disease)    L-spine  . Dermatitis    ?Contact derm? per [facility] MD: Dr. Threasa Alpha (05/09/16): punch bx 06/18/16: chronic eczematous derm/contact derm, neg fungal stain.  Allergy (RAST) testing NEG.  . GERD (gastroesophageal reflux disease)   . Heme positive stool 02/2012   colonoscopy normal.  EGD showed small hiatal hernia with associated Cameron's erosions.  Hemoccults NEG x 3 09/2014. Hem + iron def 07/2019->conservative mgmt.  . Hyperlipidemia   . Hypertension   . Insomnia    poor sleep hygiene.  Clonidine for his HTN has helped a little.  . Iron deficiency anemia 02/2012; 07/2019   2013 Mild iron deficiency->small upper GI erosions, colonoscopy normal.  07/2019->Hb 7.5, heme+, GI eval->pt chose no endoscopy->IV iron and Hb monitoring, PO iron indefinitely: Hb/iron normal on recheck 10/2019 and 12/2019.  . Macular degeneration, age related    exudative R eye; nonexudative L eye.  Vitreous degeneration bilat.  . Obesity (BMI 30-39.9)   . Retinal hemorrhage 2016   w/ retinal detachment (right eye) : anticoagulants stopped due to this  . Venous insufficiency of both lower extremities     Past  Surgical History:  Procedure Laterality Date  . APPENDECTOMY  1935  . cataract surg     Bilateral/ 08-13  . COLONOSCOPY  05/12/12   for iron def anemia--NORMAL RESULT  . ESOPHAGOGASTRODUODENOSCOPY  06/17/12   Small hiatal hernia with associated Cameron's erosions--likely source of his mild iron def anemia.  Marland Kitchen NASAL SINUS SURGERY     1998  . TONSILLECTOMY  1935    Outpatient Medications Prior to Visit  Medication Sig Dispense Refill  . Aflibercept (EYLEA IO) Inject into the eye. One injection into right eye every month     . Cholecalciferol (VITAMIN D3) 2000 UNITS TABS Take  by mouth daily.    . cloNIDine (CATAPRES) 0.1 MG tablet TAKE 1 TABLET BY MOUTH TWICE A DAY 180 tablet 0  . hydrochlorothiazide (HYDRODIURIL) 50 MG tablet Take 1 tablet (50 mg total) by mouth daily. 90 tablet 3  . Multiple Vitamins-Minerals (PRESERVISION AREDS 2 PO) Take 1 tablet by mouth 2 (two) times daily.     Marland Kitchen omeprazole (PRILOSEC) 40 MG capsule TAKE 1 CAPSULE BY MOUTH EVERY DAY 90 capsule 1  . valsartan (DIOVAN) 320 MG tablet Please specify directions, refills and quantity 30 tablet 6  . verapamil (CALAN-SR) 240 MG CR tablet TAKE 1 TABLET (240 MG TOTAL) BY MOUTH 2 (TWO) TIMES DAILY. 180 tablet 1  . multivitamin (THERAGRAN) per tablet Take 1 tablet by mouth daily.    . cetirizine (ZYRTEC) 10 MG tablet Take 10 mg by mouth daily. (Patient not taking: No sig reported)  5  . ipratropium (ATROVENT) 0.03 % nasal spray PLACE 1-2 SPRAYS IN EACH NOSTRIL ABOUT 10 MINUTES PRIOR TO EACH MEAL (Patient not taking: Reported on 12/25/2020) 30 mL 1  . tobramycin (TOBREX) 0.3 % ophthalmic solution Place 1 drop into the right eye. Four times daily begin 1 day prior to treatment (Patient not taking: Reported on 12/25/2020)  5  . triamcinolone (KENALOG) 0.1 % Apply topically 2 (two) times daily. (Patient not taking: Reported on 12/25/2020)     No facility-administered medications prior to visit.    No Known Allergies  ROS As per HPI  PE: Vitals with BMI 12/25/2020 09/25/2020 06/22/2020  Height 5\' 10"  5\' 10"  5\' 10"   Weight 202 lbs 10 oz 195 lbs 6 oz 193 lbs 8 oz  BMI 29.07 42.59 56.38  Systolic 756 433 295  Diastolic 69 73 80  Pulse 71 61 61    Gen: Alert, well appearing.  Patient is oriented to person, place, time, and situation. AFFECT: pleasant, lucid thought and speech. CV: RRR, no m/r/g.   LUNGS: CTA bilat, nonlabored resps, good aeration in all lung fields. EXT: no clubbing or cyanosis.  2-3+ bilat LL pitting edema with mild diffuse superficial flaky desquamation skin changes of LL's.    LABS:     Chemistry      Component Value Date/Time   NA 139 09/25/2020 1121   K 3.8 09/25/2020 1121   CL 100 09/25/2020 1121   CO2 31 09/25/2020 1121   BUN 19 09/25/2020 1121   CREATININE 1.00 09/25/2020 1121      Component Value Date/Time   CALCIUM 9.4 09/25/2020 1121   ALKPHOS 80 07/08/2019 1411   AST 11 07/08/2019 1411   ALT 9 07/08/2019 1411   BILITOT 0.3 07/08/2019 1411     Lab Results  Component Value Date   WBC 6.8 09/25/2020   HGB 13.8 09/25/2020   HCT 40.9 09/25/2020   MCV 99.1 09/25/2020   PLT  377.0 09/25/2020   Lab Results  Component Value Date   IRON 86 06/22/2020   TIBC 307 06/22/2020   FERRITIN 52 06/22/2020   Lab Results  Component Value Date   CHOL 192 06/30/2017   HDL 57.20 06/30/2017   LDLCALC 104 (H) 06/30/2017   LDLDIRECT 112.5 09/11/2011   TRIG 156.0 (H) 06/30/2017   CHOLHDL 3 06/30/2017   IMPRESSION AND PLAN:  1) HTN, control a bit hard to discern but I think overall stable/adequate. Trying to avoid confusion with this is hard w/him esp with recent pharmacy-driven med changes b/c of supply issues (chlorthalidone to hctz, then losartan to valsartan). Discussed and clarified today, reiterated correct regimen. Monitor lytes/cr today.  2) Chronic bilat LE edema d/t chronic venous insufficiency: some worsening lately d/t varying Na intake and with change from chlorthal to hydrochlorothiazide. Reiterated importance of lowering Na intake consistently but he finds this very difficult b/c he enjoys his food so much.  3) Hx of IDA d/t chronic GI bleeding. No sign of overt bleeding.   Plan has been: no endoscopy per pt choice, he is s/p iv and now PO iron long term, monitoring Hb/iron regularly-->checking cbc and iron studies today.  An After Visit Summary was printed and given to the patient.  FOLLOW UP: Return in about 3 months (around 03/27/2021) for routine chronic illness f/u.  Signed:  Crissie Sickles, MD           12/25/2020

## 2020-12-26 LAB — IRON,TIBC AND FERRITIN PANEL
%SAT: 22 % (calc) (ref 20–48)
Ferritin: 46 ng/mL (ref 24–380)
Iron: 68 ug/dL (ref 50–180)
TIBC: 312 mcg/dL (calc) (ref 250–425)

## 2021-01-05 ENCOUNTER — Telehealth: Payer: Self-pay

## 2021-01-05 NOTE — Telephone Encounter (Signed)
Patient returning call from letter he received.  He is so sorry he received so many spam calls that he hardly answers the phone anymore.  He will be looking out for our number to come on his caller ID.

## 2021-01-05 NOTE — Telephone Encounter (Signed)
Spoke with patient regarding results/recommendations.  

## 2021-02-07 ENCOUNTER — Other Ambulatory Visit: Payer: Self-pay | Admitting: Family Medicine

## 2021-02-16 ENCOUNTER — Other Ambulatory Visit: Payer: Self-pay | Admitting: Family Medicine

## 2021-02-16 DIAGNOSIS — I10 Essential (primary) hypertension: Secondary | ICD-10-CM

## 2021-02-22 DIAGNOSIS — H353211 Exudative age-related macular degeneration, right eye, with active choroidal neovascularization: Secondary | ICD-10-CM | POA: Diagnosis not present

## 2021-02-22 DIAGNOSIS — H353122 Nonexudative age-related macular degeneration, left eye, intermediate dry stage: Secondary | ICD-10-CM | POA: Diagnosis not present

## 2021-02-22 DIAGNOSIS — H35372 Puckering of macula, left eye: Secondary | ICD-10-CM | POA: Diagnosis not present

## 2021-02-22 DIAGNOSIS — H35412 Lattice degeneration of retina, left eye: Secondary | ICD-10-CM | POA: Diagnosis not present

## 2021-03-27 ENCOUNTER — Ambulatory Visit: Payer: Medicare Other | Admitting: Family Medicine

## 2021-04-18 ENCOUNTER — Ambulatory Visit: Payer: Medicare Other | Admitting: Family Medicine

## 2021-04-25 ENCOUNTER — Other Ambulatory Visit: Payer: Self-pay

## 2021-04-26 ENCOUNTER — Ambulatory Visit (INDEPENDENT_AMBULATORY_CARE_PROVIDER_SITE_OTHER): Payer: Medicare Other | Admitting: Family Medicine

## 2021-04-26 ENCOUNTER — Other Ambulatory Visit: Payer: Self-pay

## 2021-04-26 ENCOUNTER — Encounter: Payer: Self-pay | Admitting: Family Medicine

## 2021-04-26 VITALS — BP 144/80 | HR 74 | Temp 98.3°F | Wt 194.8 lb

## 2021-04-26 DIAGNOSIS — R531 Weakness: Secondary | ICD-10-CM

## 2021-04-26 DIAGNOSIS — I10 Essential (primary) hypertension: Secondary | ICD-10-CM

## 2021-04-26 DIAGNOSIS — R293 Abnormal posture: Secondary | ICD-10-CM

## 2021-04-26 DIAGNOSIS — R5381 Other malaise: Secondary | ICD-10-CM

## 2021-04-26 DIAGNOSIS — R2681 Unsteadiness on feet: Secondary | ICD-10-CM | POA: Diagnosis not present

## 2021-04-26 DIAGNOSIS — I872 Venous insufficiency (chronic) (peripheral): Secondary | ICD-10-CM | POA: Diagnosis not present

## 2021-04-26 DIAGNOSIS — R29898 Other symptoms and signs involving the musculoskeletal system: Secondary | ICD-10-CM | POA: Diagnosis not present

## 2021-04-26 LAB — BASIC METABOLIC PANEL
BUN: 27 mg/dL — ABNORMAL HIGH (ref 6–23)
CO2: 27 mEq/L (ref 19–32)
Calcium: 9.8 mg/dL (ref 8.4–10.5)
Chloride: 100 mEq/L (ref 96–112)
Creatinine, Ser: 1.06 mg/dL (ref 0.40–1.50)
GFR: 61.26 mL/min (ref >60.00)
Glucose, Bld: 93 mg/dL (ref 70–99)
Potassium: 3.6 mEq/L (ref 3.5–5.1)
Sodium: 138 mEq/L (ref 135–145)

## 2021-04-26 NOTE — Progress Notes (Signed)
OFFICE VISIT  04/26/2021  CC:  Chief Complaint  Patient presents with   Follow-up    Follow up, feeling "lousy"    HPI:    Patient is a 87 y.o. Caucasian male who presents for 4 month f/u HTN, chronic venous insufficiency edema, and hx of IDA. A/P as of last visit: "1) HTN, control a bit hard to discern but I think overall stable/adequate. Trying to avoid confusion with this is hard w/him esp with recent pharmacy-driven med changes b/c of supply issues (chlorthalidone to hctz, then losartan to valsartan). Discussed and clarified today, reiterated correct regimen. Monitor lytes/cr today.   2) Chronic bilat LE edema d/t chronic venous insufficiency: some worsening lately d/t varying Na intake and with change from chlorthal to hydrochlorothiazide. Reiterated importance of lowering Na intake consistently but he finds this very difficult b/c he enjoys his food so much.   3) Hx of IDA d/t chronic GI bleeding. No sign of overt bleeding.   Plan has been: no endoscopy per pt choice, he is s/p iv and now PO iron long term, monitoring Hb/iron regularly-->checking cbc and iron studies today."  INTERIM HX: All labs last visit normal. Legs gradually getting weaker ever since a fall when walking up inclined driveway a few months ago. More tired in LL's when he tries to ambulate.  Return to normal right after sitting down.  No aching in calves with ambulation or at rest. Appetite remains good, eats whatever he wants+junk food.  Home bp monitoring: 110-50s/60s-90s.  No HR data.  Legs swelling not too bad, goes down at night.  No pain.  ROS as above, plus--> no fevers, no CP, no SOB, no wheezing, no cough, no dizziness, no HAs, no rashes, no melena/hematochezia.  No polyuria or polydipsia.  No myalgias or arthralgias.  No focal weakness, paresthesias, or tremors.  No acute vision or hearing abnormalities.  No dysuria or unusual/new urinary urgency or frequency.  No recent changes in lower legs. No  n/v/d or abd pain.  No palpitations.     Past Medical History:  Diagnosis Date   Arthritis    Ataxia 2014/15   brain MRI 02/2014 showed encephalomalacia parietal and occip lobes c/w old infarcts, plus diffuse cerebral atrophy, o/w normal   Cataract    Chronic renal insufficiency, stage 2 (mild) 12/2016   GFR 60s-70s   DDD (degenerative disc disease)    L-spine   Dermatitis    ?Contact derm? per [facility]: Dr. Threasa Alpha (05/09/16): punch bx 06/18/16: chronic eczematous derm/contact derm, neg fungal stain.  Allergy (RAST) testing NEG.   GERD (gastroesophageal reflux disease)    Heme positive stool 02/2012   colonoscopy normal.  EGD showed small hiatal hernia with associated Cameron's erosions.  Hemoccults NEG x 3 09/2014. Hem + iron def 07/2019->conservative mgmt.   Hyperlipidemia    Hypertension    Insomnia    poor sleep hygiene.  Clonidine for his HTN has helped a little.   Iron deficiency anemia 02/2012; 07/2019   2013 Mild iron deficiency->small upper GI erosions, colonoscopy normal.  07/2019->Hb 7.5, heme+, GI eval->pt chose no endoscopy->IV iron and Hb monitoring, PO iron indefinitely: Hb/iron normal on recheck 10/2019 and 12/2019.   Macular degeneration, age related    exudative R eye; nonexudative L eye.  Vitreous degeneration bilat.   Obesity (BMI 30-39.9)    Retinal hemorrhage 2016   w/ retinal detachment (right eye) : anticoagulants stopped due to this   Venous insufficiency of both lower  extremities     Past Surgical History:  Procedure Laterality Date   APPENDECTOMY  1935   cataract surg     Bilateral/ 08-13   COLONOSCOPY  05/12/12   for iron def anemia--NORMAL RESULT   ESOPHAGOGASTRODUODENOSCOPY  06/17/12   Small hiatal hernia with associated Cameron's erosions--likely source of his mild iron def anemia.   [place]    Outpatient Medications Prior to Visit  Medication Sig Dispense Refill   Aflibercept (EYLEA IO)  Inject into the eye. One injection into right eye every month      cetirizine (ZYRTEC) 10 MG tablet Take 10 mg by mouth daily.  5   Cholecalciferol (VITAMIN D3) 2000 UNITS TABS Take by mouth daily.     cloNIDine (CATAPRES) 0.1 MG tablet TAKE 1 TABLET BY MOUTH TWICE A DAY 180 tablet 0   hydrochlorothiazide (HYDRODIURIL) 50 MG tablet Take 1 tablet (50 mg total) by mouth daily. 90 tablet 3   ipratropium (ATROVENT) 0.03 % nasal spray PLACE 1-2 SPRAYS IN EACH NOSTRIL ABOUT 10 MINUTES PRIOR TO EACH MEAL 30 mL 1   Multiple Vitamins-Minerals (PRESERVISION AREDS 2 PO) Take 1 tablet by mouth 2 (two) times daily.      omeprazole (PRILOSEC) 40 MG capsule TAKE 1 CAPSULE BY MOUTH EVERY DAY 90 capsule 1   SODIUM FLUORIDE 5000 PPM 1.1 % PSTE Take by mouth daily.     tobramycin (TOBREX) 0.3 % ophthalmic solution Place 1 drop into the right eye. Four times daily begin 1 day prior to treatment  5   triamcinolone (KENALOG) 0.1 % Apply topically 2 (two) times daily.     valsartan (DIOVAN) 320 MG tablet Please specify directions, refills and quantity 30 tablet 6   verapamil (CALAN-SR) 240 MG CR tablet TAKE 1 TABLET (240 MG TOTAL) BY MOUTH 2 (TWO) TIMES DAILY. 180 tablet 1   No facility-administered medications prior to visit.    No Known Allergies  ROS As per HPI  PE: Vitals with BMI 04/26/2021 04/26/2021 12/25/2020  Height - - '5\' 10"'$   Weight - 194 lbs 13 oz 202 lbs 10 oz  BMI - - 99991111  Systolic [id number]  Diastolic 80 78 69  Pulse - 74 71   Gen: Alert, well appearing.  Patient is oriented to person, place, time, and situation. AFFECT: pleasant, lucid thought and speech. CV: RRR, no m/r/g.   LUNGS: CTA bilat, nonlabored resps, good aeration in all lung fields. EXT: no clubbing or cyanosis.  1+ bilat pitting edema, mild pinkish + freckling discoloration of pretibial skin, no warmth or tenderness. +mild fibrotic skin changes here. Mild bony hypertrophy of knees bilat, no effusion, no tenderness, no pain  with ROM.  Mild crepitus with ROM bilat , L>R.   He ambulates slowly, mild stoop, slow and cautious steps.  No veering, no shuffling. No tremor.   LABS:  Lab Results  Component Value Date   TSH 2.44 07/08/2019   Lab Results  Component Value Date   WBC 7.4 12/25/2020   HGB 13.7 12/25/2020   HCT 40.1 12/25/2020   MCV 98.5 12/25/2020   PLT 375.0 12/25/2020   Lab Results  Component Value Date   IRON 68 12/25/2020   TIBC 312 12/25/2020   FERRITIN 46 12/25/2020   Lab Results  Component Value Date   [id number] 10/13/2019   Lab Results  Component Value Date   CREATININE 0.97 12/25/2020   BUN  24 (H) 12/25/2020   NA 137 12/25/2020   K 4.0 12/25/2020   CL 98 12/25/2020   CO2 29 12/25/2020   Lab Results  Component Value Date   ALT 9 07/08/2019   AST 11 07/08/2019   ALKPHOS 80 07/08/2019   BILITOT 0.3 07/08/2019   Lab Results  Component Value Date   CHOL 192 06/30/2017   Lab Results  Component Value Date   HDL 57.20 06/30/2017   Lab Results  Component Value Date   LDLCALC 104 (H) 06/30/2017   Lab Results  Component Value Date   TRIG 156.0 (H) 06/30/2017   Lab Results  Component Value Date   CHOLHDL 3 06/30/2017   Lab Results  Component Value Date   PSA 3.11 07/08/2019   PSA 1.45 10/08/2010   Lab Results  Component Value Date   HGBA1C 5.6 01/10/2020   IMPRESSION AND PLAN:  1) HTN, good control overall, mild permissive HTN ok for him. Cont clonidine 0.'1mg'$  tid, hctz 50 qd, valsartan 320 qd, calan SR '240mg'$  bid. Lytes/cr today.  2) LE edema d/t chronic venous insuff: doing well lately.  3) Generalized weakness/debilitation/postural instability/unsteady gait: needs PT. He doesn't drive/has no car.  His lives with his son who is working a lot and out of town some and would be unable to provide transportation much-->HH PT will be ordered. He has some fatigue in LL's that may be intermittent claudication but I don't think a w/u at this juncture is  indicated.    4) Hx of IDA d/t chronic GI bleeding. No sign of overt bleeding.   Plan has been: no endoscopy per pt choice, he is s/p iv and now PO iron long term, monitoring Hb/iron regularly-->all stable with q66molabs over the last 1 yr, most recently 3 mo ago. Plan on checking cbc and iron studies next f/u in 3-4 mo.  An After Visit Summary was printed and given to the patient.  FOLLOW UP: Return in about 3 months (around 07/27/2021) for routine chronic illness f/u.  Signed:  PCrissie Sickles MD           04/26/2021

## 2021-04-27 ENCOUNTER — Telehealth: Payer: Self-pay

## 2021-04-27 ENCOUNTER — Other Ambulatory Visit: Payer: Self-pay

## 2021-04-27 DIAGNOSIS — R2681 Unsteadiness on feet: Secondary | ICD-10-CM

## 2021-04-27 DIAGNOSIS — R293 Abnormal posture: Secondary | ICD-10-CM

## 2021-04-27 DIAGNOSIS — R5381 Other malaise: Secondary | ICD-10-CM

## 2021-04-27 DIAGNOSIS — R531 Weakness: Secondary | ICD-10-CM

## 2021-04-27 NOTE — Telephone Encounter (Signed)
[  12:06 PM] McGowen, Manon Hilding will you order [facility] PT for Andreaz Kalmbach (11/20/1926), dx generalized weakness, postural instability, unsteady gait, and debilitation.  He has no car and his son can't take him to visits.

## 2021-04-27 NOTE — Telephone Encounter (Signed)
HH PT order placed per PCP. See below

## 2021-05-02 ENCOUNTER — Telehealth: Payer: Self-pay | Admitting: Family Medicine

## 2021-05-02 DIAGNOSIS — R2681 Unsteadiness on feet: Secondary | ICD-10-CM | POA: Diagnosis not present

## 2021-05-02 DIAGNOSIS — E785 Hyperlipidemia, unspecified: Secondary | ICD-10-CM | POA: Diagnosis not present

## 2021-05-02 DIAGNOSIS — G4700 Insomnia, unspecified: Secondary | ICD-10-CM | POA: Diagnosis not present

## 2021-05-02 DIAGNOSIS — I129 Hypertensive chronic kidney disease with stage 1 through stage 4 chronic kidney disease, or unspecified chronic kidney disease: Secondary | ICD-10-CM | POA: Diagnosis not present

## 2021-05-02 DIAGNOSIS — K922 Gastrointestinal hemorrhage, unspecified: Secondary | ICD-10-CM | POA: Diagnosis not present

## 2021-05-02 DIAGNOSIS — N182 Chronic kidney disease, stage 2 (mild): Secondary | ICD-10-CM | POA: Diagnosis not present

## 2021-05-02 DIAGNOSIS — D509 Iron deficiency anemia, unspecified: Secondary | ICD-10-CM | POA: Diagnosis not present

## 2021-05-02 DIAGNOSIS — I872 Venous insufficiency (chronic) (peripheral): Secondary | ICD-10-CM | POA: Diagnosis not present

## 2021-05-02 DIAGNOSIS — K219 Gastro-esophageal reflux disease without esophagitis: Secondary | ICD-10-CM | POA: Diagnosis not present

## 2021-05-02 DIAGNOSIS — E669 Obesity, unspecified: Secondary | ICD-10-CM | POA: Diagnosis not present

## 2021-05-02 DIAGNOSIS — Z9181 History of falling: Secondary | ICD-10-CM | POA: Diagnosis not present

## 2021-05-02 DIAGNOSIS — M5136 Other intervertebral disc degeneration, lumbar region: Secondary | ICD-10-CM | POA: Diagnosis not present

## 2021-05-02 DIAGNOSIS — H3530 Unspecified macular degeneration: Secondary | ICD-10-CM | POA: Diagnosis not present

## 2021-05-02 DIAGNOSIS — M1990 Unspecified osteoarthritis, unspecified site: Secondary | ICD-10-CM | POA: Diagnosis not present

## 2021-05-02 DIAGNOSIS — Z6828 Body mass index (BMI) 28.0-28.9, adult: Secondary | ICD-10-CM | POA: Diagnosis not present

## 2021-05-02 NOTE — Telephone Encounter (Signed)
Ok for orders? 

## 2021-05-02 NOTE — Telephone Encounter (Signed)
Yes, good.

## 2021-05-02 NOTE — Telephone Encounter (Signed)
Remo Lipps with [facility] called for verbal orders as follows: PT x 2 weeks, 3 1 week, 4 His callback number is [phone]

## 2021-05-03 DIAGNOSIS — H35372 Puckering of macula, left eye: Secondary | ICD-10-CM | POA: Diagnosis not present

## 2021-05-03 DIAGNOSIS — H353122 Nonexudative age-related macular degeneration, left eye, intermediate dry stage: Secondary | ICD-10-CM | POA: Diagnosis not present

## 2021-05-03 DIAGNOSIS — H43813 Vitreous degeneration, bilateral: Secondary | ICD-10-CM | POA: Diagnosis not present

## 2021-05-03 DIAGNOSIS — H353211 Exudative age-related macular degeneration, right eye, with active choroidal neovascularization: Secondary | ICD-10-CM | POA: Diagnosis not present

## 2021-05-03 NOTE — Telephone Encounter (Signed)
LM to return call regarding VO

## 2021-05-04 DIAGNOSIS — N182 Chronic kidney disease, stage 2 (mild): Secondary | ICD-10-CM | POA: Diagnosis not present

## 2021-05-04 DIAGNOSIS — I872 Venous insufficiency (chronic) (peripheral): Secondary | ICD-10-CM | POA: Diagnosis not present

## 2021-05-04 DIAGNOSIS — I129 Hypertensive chronic kidney disease with stage 1 through stage 4 chronic kidney disease, or unspecified chronic kidney disease: Secondary | ICD-10-CM | POA: Diagnosis not present

## 2021-05-04 DIAGNOSIS — D509 Iron deficiency anemia, unspecified: Secondary | ICD-10-CM | POA: Diagnosis not present

## 2021-05-04 DIAGNOSIS — K922 Gastrointestinal hemorrhage, unspecified: Secondary | ICD-10-CM | POA: Diagnosis not present

## 2021-05-04 DIAGNOSIS — R2681 Unsteadiness on feet: Secondary | ICD-10-CM | POA: Diagnosis not present

## 2021-05-04 NOTE — Telephone Encounter (Signed)
LM regarding VO

## 2021-05-07 NOTE — Telephone Encounter (Signed)
Left secure VM regarding VO and office fax  # provided

## 2021-05-09 DIAGNOSIS — D509 Iron deficiency anemia, unspecified: Secondary | ICD-10-CM | POA: Diagnosis not present

## 2021-05-09 DIAGNOSIS — N182 Chronic kidney disease, stage 2 (mild): Secondary | ICD-10-CM | POA: Diagnosis not present

## 2021-05-09 DIAGNOSIS — I872 Venous insufficiency (chronic) (peripheral): Secondary | ICD-10-CM | POA: Diagnosis not present

## 2021-05-09 DIAGNOSIS — K922 Gastrointestinal hemorrhage, unspecified: Secondary | ICD-10-CM | POA: Diagnosis not present

## 2021-05-09 DIAGNOSIS — I129 Hypertensive chronic kidney disease with stage 1 through stage 4 chronic kidney disease, or unspecified chronic kidney disease: Secondary | ICD-10-CM | POA: Diagnosis not present

## 2021-05-09 DIAGNOSIS — R2681 Unsteadiness on feet: Secondary | ICD-10-CM | POA: Diagnosis not present

## 2021-05-11 DIAGNOSIS — K922 Gastrointestinal hemorrhage, unspecified: Secondary | ICD-10-CM | POA: Diagnosis not present

## 2021-05-11 DIAGNOSIS — D509 Iron deficiency anemia, unspecified: Secondary | ICD-10-CM | POA: Diagnosis not present

## 2021-05-11 DIAGNOSIS — I129 Hypertensive chronic kidney disease with stage 1 through stage 4 chronic kidney disease, or unspecified chronic kidney disease: Secondary | ICD-10-CM | POA: Diagnosis not present

## 2021-05-11 DIAGNOSIS — N182 Chronic kidney disease, stage 2 (mild): Secondary | ICD-10-CM | POA: Diagnosis not present

## 2021-05-11 DIAGNOSIS — R2681 Unsteadiness on feet: Secondary | ICD-10-CM | POA: Diagnosis not present

## 2021-05-11 DIAGNOSIS — I872 Venous insufficiency (chronic) (peripheral): Secondary | ICD-10-CM | POA: Diagnosis not present

## 2021-05-14 ENCOUNTER — Other Ambulatory Visit: Payer: Self-pay | Admitting: Family Medicine

## 2021-05-15 ENCOUNTER — Telehealth: Payer: Self-pay | Admitting: Family Medicine

## 2021-05-15 DIAGNOSIS — R2681 Unsteadiness on feet: Secondary | ICD-10-CM | POA: Diagnosis not present

## 2021-05-15 DIAGNOSIS — I872 Venous insufficiency (chronic) (peripheral): Secondary | ICD-10-CM | POA: Diagnosis not present

## 2021-05-15 DIAGNOSIS — I129 Hypertensive chronic kidney disease with stage 1 through stage 4 chronic kidney disease, or unspecified chronic kidney disease: Secondary | ICD-10-CM | POA: Diagnosis not present

## 2021-05-15 DIAGNOSIS — D509 Iron deficiency anemia, unspecified: Secondary | ICD-10-CM | POA: Diagnosis not present

## 2021-05-15 DIAGNOSIS — N182 Chronic kidney disease, stage 2 (mild): Secondary | ICD-10-CM | POA: Diagnosis not present

## 2021-05-15 DIAGNOSIS — K922 Gastrointestinal hemorrhage, unspecified: Secondary | ICD-10-CM | POA: Diagnosis not present

## 2021-05-15 NOTE — Telephone Encounter (Signed)
Signed and put in box to go up front. Signed:  Crissie Sickles, MD           05/15/2021

## 2021-05-15 NOTE — Telephone Encounter (Signed)
Received faxed orders from [facility]. Placed in Dr. McGowen's front office inbox to be signed and returned. 

## 2021-05-15 NOTE — Telephone Encounter (Signed)
Home health orders received 05/14/21 for [facility] initiation orders: Yes.  Home health re-certification orders: No. Patient last seen by ordering physician for this condition: 04/26/21. Must be less than 90 days for re-certification and less than 30 days prior for initiation. Visit must have been for the condition the orders are being placed.  Patient meets criteria for Physician to sign orders: Yes.        Current med list has been attached: Yes        Orders placed on physicians desk for signature: 05/15/21 (date) If patient does not meet criteria for orders to be signed: pt was called to schedule appt. Appt is scheduled for 07/26/21.   [place]

## 2021-05-15 NOTE — Telephone Encounter (Signed)
Faxed completed form to [facility]. Fax confirmation received.

## 2021-05-17 DIAGNOSIS — D509 Iron deficiency anemia, unspecified: Secondary | ICD-10-CM | POA: Diagnosis not present

## 2021-05-17 DIAGNOSIS — I129 Hypertensive chronic kidney disease with stage 1 through stage 4 chronic kidney disease, or unspecified chronic kidney disease: Secondary | ICD-10-CM | POA: Diagnosis not present

## 2021-05-17 DIAGNOSIS — N182 Chronic kidney disease, stage 2 (mild): Secondary | ICD-10-CM | POA: Diagnosis not present

## 2021-05-17 DIAGNOSIS — I872 Venous insufficiency (chronic) (peripheral): Secondary | ICD-10-CM | POA: Diagnosis not present

## 2021-05-17 DIAGNOSIS — R2681 Unsteadiness on feet: Secondary | ICD-10-CM | POA: Diagnosis not present

## 2021-05-17 DIAGNOSIS — K922 Gastrointestinal hemorrhage, unspecified: Secondary | ICD-10-CM | POA: Diagnosis not present

## 2021-05-22 DIAGNOSIS — Z129 Encounter for screening for malignant neoplasm, site unspecified: Secondary | ICD-10-CM | POA: Diagnosis not present

## 2021-05-22 DIAGNOSIS — K922 Gastrointestinal hemorrhage, unspecified: Secondary | ICD-10-CM | POA: Diagnosis not present

## 2021-05-22 DIAGNOSIS — Z85828 Personal history of other malignant neoplasm of skin: Secondary | ICD-10-CM | POA: Diagnosis not present

## 2021-05-22 DIAGNOSIS — N182 Chronic kidney disease, stage 2 (mild): Secondary | ICD-10-CM | POA: Diagnosis not present

## 2021-05-22 DIAGNOSIS — I129 Hypertensive chronic kidney disease with stage 1 through stage 4 chronic kidney disease, or unspecified chronic kidney disease: Secondary | ICD-10-CM | POA: Diagnosis not present

## 2021-05-22 DIAGNOSIS — L570 Actinic keratosis: Secondary | ICD-10-CM | POA: Diagnosis not present

## 2021-05-22 DIAGNOSIS — D509 Iron deficiency anemia, unspecified: Secondary | ICD-10-CM | POA: Diagnosis not present

## 2021-05-22 DIAGNOSIS — R2681 Unsteadiness on feet: Secondary | ICD-10-CM | POA: Diagnosis not present

## 2021-05-22 DIAGNOSIS — I872 Venous insufficiency (chronic) (peripheral): Secondary | ICD-10-CM | POA: Diagnosis not present

## 2021-05-29 ENCOUNTER — Telehealth: Payer: Self-pay | Admitting: Family Medicine

## 2021-05-29 NOTE — Telephone Encounter (Signed)
Received faxed orders from [facility]. Placed in Dr. McGowen's front office inbox to be signed and returned. 

## 2021-05-29 NOTE — Telephone Encounter (Signed)
Home health PT orders received 05/29/21 for [place] [facility] VO given: 05/07/21 Home health initiation orders: Yes.  Home health re-certification orders: No. Patient last seen by ordering physician for this condition: 0728/22. Must be less than 90 days for re-certification and less than 30 days prior for initiation. Visit must have been for the condition the orders are being placed.  Patient meets criteria for Physician to sign orders: Yes.        Current med list has been attached: Yes        Orders placed on physicians desk for signature: 05/29/21 (date) If patient does not meet criteria for orders to be signed: pt was called to schedule appt. Appt is scheduled for 07/26/21.   Emilee Hero

## 2021-05-30 DIAGNOSIS — R2681 Unsteadiness on feet: Secondary | ICD-10-CM | POA: Diagnosis not present

## 2021-05-30 DIAGNOSIS — N182 Chronic kidney disease, stage 2 (mild): Secondary | ICD-10-CM | POA: Diagnosis not present

## 2021-05-30 DIAGNOSIS — D509 Iron deficiency anemia, unspecified: Secondary | ICD-10-CM | POA: Diagnosis not present

## 2021-05-30 DIAGNOSIS — K922 Gastrointestinal hemorrhage, unspecified: Secondary | ICD-10-CM | POA: Diagnosis not present

## 2021-05-30 DIAGNOSIS — I872 Venous insufficiency (chronic) (peripheral): Secondary | ICD-10-CM | POA: Diagnosis not present

## 2021-05-30 DIAGNOSIS — I129 Hypertensive chronic kidney disease with stage 1 through stage 4 chronic kidney disease, or unspecified chronic kidney disease: Secondary | ICD-10-CM | POA: Diagnosis not present

## 2021-05-30 NOTE — Telephone Encounter (Signed)
Signed and put in box to go up front. Signed:  Crissie Sickles, MD           05/30/2021

## 2021-05-31 NOTE — Telephone Encounter (Signed)
Faxed signed orders to [facility]. Fax confirmation received.

## 2021-06-01 DIAGNOSIS — Z9181 History of falling: Secondary | ICD-10-CM | POA: Diagnosis not present

## 2021-06-01 DIAGNOSIS — N182 Chronic kidney disease, stage 2 (mild): Secondary | ICD-10-CM | POA: Diagnosis not present

## 2021-06-01 DIAGNOSIS — I872 Venous insufficiency (chronic) (peripheral): Secondary | ICD-10-CM | POA: Diagnosis not present

## 2021-06-01 DIAGNOSIS — I129 Hypertensive chronic kidney disease with stage 1 through stage 4 chronic kidney disease, or unspecified chronic kidney disease: Secondary | ICD-10-CM | POA: Diagnosis not present

## 2021-06-01 DIAGNOSIS — K219 Gastro-esophageal reflux disease without esophagitis: Secondary | ICD-10-CM | POA: Diagnosis not present

## 2021-06-01 DIAGNOSIS — E669 Obesity, unspecified: Secondary | ICD-10-CM | POA: Diagnosis not present

## 2021-06-01 DIAGNOSIS — G4700 Insomnia, unspecified: Secondary | ICD-10-CM | POA: Diagnosis not present

## 2021-06-01 DIAGNOSIS — H3530 Unspecified macular degeneration: Secondary | ICD-10-CM | POA: Diagnosis not present

## 2021-06-01 DIAGNOSIS — M5136 Other intervertebral disc degeneration, lumbar region: Secondary | ICD-10-CM | POA: Diagnosis not present

## 2021-06-01 DIAGNOSIS — M1990 Unspecified osteoarthritis, unspecified site: Secondary | ICD-10-CM | POA: Diagnosis not present

## 2021-06-01 DIAGNOSIS — E785 Hyperlipidemia, unspecified: Secondary | ICD-10-CM | POA: Diagnosis not present

## 2021-06-01 DIAGNOSIS — R2681 Unsteadiness on feet: Secondary | ICD-10-CM | POA: Diagnosis not present

## 2021-06-01 DIAGNOSIS — D509 Iron deficiency anemia, unspecified: Secondary | ICD-10-CM | POA: Diagnosis not present

## 2021-06-01 DIAGNOSIS — K922 Gastrointestinal hemorrhage, unspecified: Secondary | ICD-10-CM | POA: Diagnosis not present

## 2021-06-01 DIAGNOSIS — Z6828 Body mass index (BMI) 28.0-28.9, adult: Secondary | ICD-10-CM | POA: Diagnosis not present

## 2021-06-08 DIAGNOSIS — N182 Chronic kidney disease, stage 2 (mild): Secondary | ICD-10-CM | POA: Diagnosis not present

## 2021-06-08 DIAGNOSIS — I872 Venous insufficiency (chronic) (peripheral): Secondary | ICD-10-CM | POA: Diagnosis not present

## 2021-06-08 DIAGNOSIS — K922 Gastrointestinal hemorrhage, unspecified: Secondary | ICD-10-CM | POA: Diagnosis not present

## 2021-06-08 DIAGNOSIS — I129 Hypertensive chronic kidney disease with stage 1 through stage 4 chronic kidney disease, or unspecified chronic kidney disease: Secondary | ICD-10-CM | POA: Diagnosis not present

## 2021-06-08 DIAGNOSIS — R2681 Unsteadiness on feet: Secondary | ICD-10-CM | POA: Diagnosis not present

## 2021-06-08 DIAGNOSIS — D509 Iron deficiency anemia, unspecified: Secondary | ICD-10-CM | POA: Diagnosis not present

## 2021-06-11 ENCOUNTER — Other Ambulatory Visit: Payer: Self-pay | Admitting: Family Medicine

## 2021-06-14 ENCOUNTER — Other Ambulatory Visit: Payer: Self-pay | Admitting: Family Medicine

## 2021-06-29 ENCOUNTER — Ambulatory Visit (INDEPENDENT_AMBULATORY_CARE_PROVIDER_SITE_OTHER): Payer: Medicare Other | Admitting: Family Medicine

## 2021-06-29 ENCOUNTER — Other Ambulatory Visit: Payer: Self-pay

## 2021-06-29 DIAGNOSIS — Z0000 Encounter for general adult medical examination without abnormal findings: Secondary | ICD-10-CM

## 2021-06-29 NOTE — Progress Notes (Signed)
Subjective:   Glen Barnett is a 87 y.o. male who presents for an Initial Medicare Annual Wellness Visit. I connected with  Glen Barnett on 06/29/21 by a audio enabled telemedicine application and verified that I am speaking with the correct person using two identifiers.   I discussed the limitations of evaluation and management by telemedicine. The patient expressed understanding and agreed to proceed.   Location of patient: Home Location of provider: Office Persons participating in visit: Glen Barnett. Glen Barnett. (Patient) Glen Barnett, CMA   Review of Systems    Defer to PCP: Glen Sou, MD Cardiac Risk Factors include: hypertension     Objective:    Today's Vitals   06/29/21 1824  PainSc: 0-No pain   There is no height or weight on file to calculate BMI.  Advanced Directives 06/29/2021 01/30/2018 01/07/2017  Does Patient Have a Medical Advance Directive? Yes Yes Yes  Type of Advance Directive - [place];Living will Living will;Healthcare Power of Attorney  Does patient want to make changes to medical advance directive? No - Patient declined - -  Copy of [place] in Chart? - Yes No - copy requested    Current Medications (verified) Outpatient Encounter Medications as of 06/29/2021  Medication Sig   Aflibercept (EYLEA IO) Inject into the eye. One injection into right eye every month    cetirizine (ZYRTEC) 10 MG tablet Take 10 mg by mouth daily.   Cholecalciferol (VITAMIN D3) 2000 UNITS TABS Take by mouth daily.   cloNIDine (CATAPRES) 0.1 MG tablet TAKE 1 TABLET BY MOUTH TWICE A DAY   hydrochlorothiazide (HYDRODIURIL) 50 MG tablet Take 1 tablet (50 mg total) by mouth daily.   ipratropium (ATROVENT) 0.03 % nasal spray PLACE 1-2 SPRAYS IN EACH NOSTRIL ABOUT 10 MINUTES PRIOR TO EACH MEAL   Multiple Vitamins-Minerals (PRESERVISION AREDS 2 PO) Take 1 tablet by mouth 2 (two) times daily.    omeprazole (PRILOSEC) 40  MG capsule TAKE 1 CAPSULE BY MOUTH EVERY DAY   SODIUM FLUORIDE 5000 PPM 1.1 % PSTE Take by mouth daily.   tobramycin (TOBREX) 0.3 % ophthalmic solution Place 1 drop into the right eye. Four times daily begin 1 day prior to treatment   triamcinolone (KENALOG) 0.1 % Apply topically 2 (two) times daily.   valsartan (DIOVAN) 320 MG tablet TAKE 1 TABLET BY MOUTH EVERY DAY   verapamil (CALAN-SR) 240 MG CR tablet TAKE 1 TABLET (240 MG TOTAL) BY MOUTH 2 (TWO) TIMES DAILY.   No facility-administered encounter medications on file as of 06/29/2021.    Allergies (verified) Patient has no known allergies.   History: Past Medical History:  Diagnosis Date   Arthritis    Ataxia 2014/15   brain MRI 02/2014 showed encephalomalacia parietal and occip lobes c/w old infarcts, plus diffuse cerebral atrophy, o/w normal   Cataract    Chronic renal insufficiency, stage 2 (mild) 12/2016   GFR 60s-70s   DDD (degenerative disc disease)    L-spine   Dermatitis    ?Contact derm? per [facility] MD: Dr. Threasa Alpha (05/09/16): punch bx 06/18/16: chronic eczematous derm/contact derm, neg fungal stain.  Allergy (RAST) testing NEG.   GERD (gastroesophageal reflux disease)    Heme positive stool 02/2012   colonoscopy normal.  EGD showed small hiatal hernia with associated Cameron's erosions.  Hemoccults NEG x 3 09/2014. Hem + iron def 07/2019->conservative mgmt.   Hyperlipidemia    Hypertension  Insomnia    poor sleep hygiene.  Clonidine for his HTN has helped a little.   Iron deficiency anemia 02/2012; 07/2019   2013 Mild iron deficiency->small upper GI erosions, colonoscopy normal.  07/2019->Hb 7.5, heme+, GI eval->pt chose no endoscopy->IV iron and Hb monitoring, PO iron indefinitely: Hb/iron normal on recheck 10/2019 and 12/2019.   Macular degeneration, age related    exudative R eye; nonexudative L eye.  Vitreous degeneration bilat.   Obesity (BMI 30-39.9)    Retinal hemorrhage 2016   w/ retinal detachment  (right eye) : anticoagulants stopped due to this   Venous insufficiency of both lower extremities    Past Surgical History:  Procedure Laterality Date   APPENDECTOMY  1935   cataract surg     Bilateral/ 08-13   COLONOSCOPY  05/12/12   for iron def anemia--NORMAL RESULT   ESOPHAGOGASTRODUODENOSCOPY  06/17/12   Small hiatal hernia with associated Cameron's erosions--likely source of his mild iron def anemia.   NASAL SINUS SURGERY     1998   TONSILLECTOMY  1935   Family History  Problem Relation Age of Onset   Liver cancer Mother    Social History   Socioeconomic History   Marital status: Widowed    Spouse name: Not on file   Number of children: Not on file   Years of education: Not on file   Highest education level: Not on file  Occupational History   Not on file  Tobacco Use   Smoking status: Never   Smokeless tobacco: Never  Vaping Use   Vaping Use: Never used  Substance and Sexual Activity   Alcohol use: No   Drug use: No   Sexual activity: Not Currently  Other Topics Concern   Not on file  Social History Narrative   Widower.  Wife died 08-27-11 of brain cancer.   He relocated from [place] to live near son.   NO exercise.  No T/A/Ds.   Retired Chief Financial Officer.   Social Determinants of Health   Financial Resource Strain: Low Risk    Difficulty of Paying Living Expenses: Not hard at all  Food Insecurity: No Food Insecurity   Worried About Charity fundraiser in the Last Year: Never true   [employer] in the Last Year: Never true  Transportation Needs: No Transportation Needs   Lack of Transportation (Medical): No   Lack of Transportation (Non-Medical): No  Physical Activity: Inactive   Days of Exercise per Week: 0 days   Minutes of Exercise per Session: 0 min  Stress: No Stress Concern Present   Feeling of Stress : Not at all  Social Connections: Socially Isolated   Frequency of Communication with Friends and Family: More than three times a week    Frequency of Social Gatherings with Friends and Family: More than three times a week   Attends Religious Services: Never   Marine scientist or Organizations: No   Attends Archivist Meetings: Never   Marital Status: Widowed    Tobacco Counseling Counseling given: Not Answered   Clinical Intake:  Pre-visit preparation completed: Yes  Pain : 0-10 Pain Score: 0-No pain     Nutritional Risks: None Diabetes: No  How often do you need to have someone help you when you read instructions, pamphlets, or other written materials from your doctor or pharmacy?: 1 - Never What is the last grade level you completed in school?: Senior in college  Diabetic?No  Interpreter Needed?: No  Activities of Daily Living In your present state of health, do you have any difficulty performing the following activities: 06/29/2021  Hearing? Y  Comment can't hear well  Vision? Y  Comment Reading glasses  Difficulty concentrating or making decisions? Y  Walking or climbing stairs? Y  Dressing or bathing? N  Doing errands, shopping? N  Preparing Food and eating ? N  Using the Toilet? N  In the past six months, have you accidently leaked urine? N  Do you have problems with loss of bowel control? N  Managing your Medications? N  Managing your Finances? N  Housekeeping or managing your Housekeeping? Y  Comment Stays with son  Some recent data might be hidden    Patient Care Team: Glen Sou, MD as PCP - General Milus Banister, MD as Consulting Physician (Gastroenterology) Sherlynn Stalls, MD as Consulting Physician (Ophthalmology) Pablo Lawrence as Consulting Physician (Allergy and Immunology) Murvin Donning, MD (Dermatology) Pill, Ronelle Nigh, MD (Orthopedic Surgery)  Indicate any recent Medical Services you may have received from other than [facility] providers in the past year (date may be approximate).     Assessment:   This is a routine wellness examination  for Glen Barnett.  Hearing/Vision screen No results found.  Dietary issues and exercise activities discussed: Current Exercise Habits: The patient does not participate in regular exercise at present, Exercise limited by: orthopedic condition(s)   Goals Addressed   None    Depression Screen PHQ 2/9 Scores 06/29/2021 12/25/2020 01/10/2020 01/30/2018 12/29/2017 01/07/2017 10/18/2015  PHQ - 2 Score 0 1 0 0 0 0 0    Fall Risk Fall Risk  06/29/2021 12/25/2020 08/20/2019 01/30/2018 01/07/2017  Falls in the past year? 0 0 0 No No  Comment - - Emmi Telephone Survey: data to providers prior to load - -  Number falls in past yr: 0 0 - - -  Injury with Fall? 0 1 - - -  Risk for fall due to : No Fall Risks - - - -  Follow up Falls evaluation completed Falls evaluation completed - - -    FALL RISK PREVENTION PERTAINING TO THE HOME:  Any stairs in or around the home? No  If so, are there any without handrails? No  Home free of loose throw rugs in walkways, pet beds, electrical cords, etc? Yes  Adequate lighting in your home to reduce risk of falls? Yes   ASSISTIVE DEVICES UTILIZED TO PREVENT FALLS:  Life alert? No  Use of a cane, walker or w/c? Yes cane sometimes Grab bars in the bathroom? No  Shower chair or bench in shower? No  Elevated toilet seat or a handicapped toilet? Yes   TIMED UP AND GO:  Was the test performed? No .  Length of time to ambulate 10 feet: N/A sec.   Gait slow and steady without use of assistive device  Cognitive Function: MMSE - Mini Mental State Exam 01/30/2018 01/07/2017  Orientation to time 5 5  Orientation to Place 5 5  Registration 3 3  Attention/ Calculation 5 5  Recall 1 2  Language- name 2 objects 2 2  Language- repeat 1 1  Language- follow 3 step command 3 3  Language- read & follow direction 1 1  Write a sentence 1 0  Copy design 1 1  Total score 28 28     6CIT Screen 06/29/2021  What Year? 0 points  What month? 0 points  What time? 3 points  Count back  from 20 0 points  Months in reverse 4 points  Repeat phrase 0 points  Total Score 7    Immunizations Immunization History  Administered Date(s) Administered   Fluad Quad(high Dose 65+) 06/13/2019   Influenza Split 07/04/2011, 08/11/2012, 08/21/2014   Influenza, High Dose Seasonal PF 06/18/2016, 06/30/2017, 06/30/2018   Influenza,inj,Quad PF,6+ Mos 06/07/2013   Influenza-Unspecified 06/01/2015, 06/30/2020   Janssen (J&J) SARS-COV-2 Vaccination 01/27/2020   Pneumococcal Conjugate-13 09/08/2014   Pneumococcal Polysaccharide-23 09/30/1996   Td 10/01/2007   Zoster Recombinat (Shingrix) 06/13/2019, 08/16/2019   Zoster, Live 10/01/2007    TDAP status: Due, Education has been provided regarding the importance of this vaccine. Advised may receive this vaccine at local pharmacy or Health Dept. Aware to provide a copy of the vaccination record if obtained from local pharmacy or Health Dept. Verbalized acceptance and understanding.  Flu Vaccine status: Due, Education has been provided regarding the importance of this vaccine. Advised may receive this vaccine at local pharmacy or Health Dept. Aware to provide a copy of the vaccination record if obtained from local pharmacy or Health Dept. Verbalized acceptance and understanding.  Pneumococcal vaccine status: Due, Education has been provided regarding the importance of this vaccine. Advised may receive this vaccine at local pharmacy or Health Dept. Aware to provide a copy of the vaccination record if obtained from local pharmacy or Health Dept. Verbalized acceptance and understanding.  Covid-19 vaccine status: Completed vaccines  Qualifies for Shingles Vaccine? Yes   Zostavax completed Yes   Shingrix Completed?: Yes  Screening Tests Health Maintenance  Topic Date Due   TETANUS/TDAP  09/30/2017   COVID-19 Vaccine (2 - Janssen risk series) 02/24/2020   INFLUENZA VACCINE  04/30/2021   Zoster Vaccines- Shingrix  Completed   HPV VACCINES  Aged  Out    Health Maintenance  Health Maintenance Due  Topic Date Due   TETANUS/TDAP  09/30/2017   COVID-19 Vaccine (2 - Janssen risk series) 02/24/2020   INFLUENZA VACCINE  04/30/2021    Colorectal cancer screening: No longer required.   Lung Cancer Screening: (Low Dose CT Chest recommended if Age 42-80 years, 30 pack-year currently smoking OR have quit w/in 15years.) does not qualify.   Lung Cancer Screening Referral:   Additional Screening:  Hepatitis C Screening: does not qualify; Completed   Vision Screening: Recommended annual ophthalmology exams for early detection of glaucoma and other disorders of the eye. Is the patient up to date with their annual eye exam?  Yes  Who is the provider or what is the name of the office in which the patient attends annual eye exams?  If pt is not established with a provider, would they like to be referred to a provider to establish care? No .   Dental Screening: Recommended annual dental exams for proper oral hygiene  Community Resource Referral / Chronic Care Management: CRR required this visit?  No   CCM required this visit?  No      Plan:     I have personally reviewed and noted the following in the patient's chart:   Medical and social history Use of alcohol, tobacco or illicit drugs  Current medications and supplements including opioid prescriptions. Patient is not currently taking opioid prescriptions. Functional ability and status Nutritional status Physical activity Advanced directives List of other physicians Hospitalizations, surgeries, and ER visits in previous 12 months Vitals Screenings to include cognitive, depression, and falls Referrals and appointments  In addition, I have reviewed and discussed with patient certain preventive protocols,  quality metrics, and best practice recommendations. A written personalized care plan for preventive services as well as general preventive health recommendations were provided  to patient.     Edgar Frisk, [place]   06/29/2021   Nurse Notes: Non face to face 45 minute encounter Mail AVS   Mr. Glen Barnett , Thank you for taking time to come for your Medicare Wellness Visit. I appreciate your ongoing commitment to your health goals. Please review the following plan we discussed and let me know if I can assist you in the future.   These are the goals we discussed:  Goals       patient (pt-stated)      Maintain current health      Patient Stated      Maintain current health         This is a list of the screening recommended for you and due dates:  Health Maintenance  Topic Date Due   Tetanus Vaccine  09/30/2017   COVID-19 Vaccine (2 - Janssen risk series) 02/24/2020   Flu Shot  04/30/2021   Zoster (Shingles) Vaccine  Completed   HPV Vaccine  Aged Out

## 2021-07-12 DIAGNOSIS — H353211 Exudative age-related macular degeneration, right eye, with active choroidal neovascularization: Secondary | ICD-10-CM | POA: Diagnosis not present

## 2021-07-12 DIAGNOSIS — H43813 Vitreous degeneration, bilateral: Secondary | ICD-10-CM | POA: Diagnosis not present

## 2021-07-12 DIAGNOSIS — H35372 Puckering of macula, left eye: Secondary | ICD-10-CM | POA: Diagnosis not present

## 2021-07-12 DIAGNOSIS — H353122 Nonexudative age-related macular degeneration, left eye, intermediate dry stage: Secondary | ICD-10-CM | POA: Diagnosis not present

## 2021-07-26 ENCOUNTER — Other Ambulatory Visit: Payer: Self-pay

## 2021-07-26 ENCOUNTER — Ambulatory Visit (INDEPENDENT_AMBULATORY_CARE_PROVIDER_SITE_OTHER): Payer: Medicare Other | Admitting: Family Medicine

## 2021-07-26 ENCOUNTER — Encounter: Payer: Self-pay | Admitting: Family Medicine

## 2021-07-26 VITALS — BP 155/78 | HR 72 | Temp 97.8°F | Resp 16 | Ht 70.0 in | Wt 195.2 lb

## 2021-07-26 DIAGNOSIS — Z862 Personal history of diseases of the blood and blood-forming organs and certain disorders involving the immune mechanism: Secondary | ICD-10-CM

## 2021-07-26 DIAGNOSIS — R5381 Other malaise: Secondary | ICD-10-CM

## 2021-07-26 DIAGNOSIS — Z23 Encounter for immunization: Secondary | ICD-10-CM

## 2021-07-26 DIAGNOSIS — I10 Essential (primary) hypertension: Secondary | ICD-10-CM

## 2021-07-26 DIAGNOSIS — I872 Venous insufficiency (chronic) (peripheral): Secondary | ICD-10-CM

## 2021-07-26 LAB — COMPREHENSIVE METABOLIC PANEL
ALT: 19 U/L (ref 0–53)
AST: 20 U/L (ref 0–37)
Albumin: 4.4 g/dL (ref 3.5–5.2)
Alkaline Phosphatase: 104 U/L (ref 39–117)
BUN: 20 mg/dL (ref 6–23)
CO2: 30 mEq/L (ref 19–32)
Calcium: 9.4 mg/dL (ref 8.4–10.5)
Chloride: 102 mEq/L (ref 96–112)
Creatinine, Ser: 1 mg/dL (ref 0.40–1.50)
GFR: 65.58 mL/min (ref >60.00)
Glucose, Bld: 99 mg/dL (ref 70–99)
Potassium: 4.1 mEq/L (ref 3.5–5.1)
Sodium: 140 mEq/L (ref 135–145)
Total Bilirubin: 0.6 mg/dL (ref 0.2–1.2)
Total Protein: 7.2 g/dL (ref 6.0–8.3)

## 2021-07-26 LAB — CBC
HCT: 42.9 % (ref 39.0–52.0)
Hemoglobin: 14.3 g/dL (ref 13.0–17.0)
MCHC: 33.3 g/dL (ref 30.0–36.0)
MCV: 99.8 fl (ref 78.0–100.0)
Platelets: 342 10*3/uL (ref 150.0–400.0)
RBC: 4.3 Mil/uL (ref 4.22–5.81)
RDW: 14.8 % (ref 11.5–15.5)
WBC: 6.3 10*3/uL (ref 4.0–10.5)

## 2021-07-26 NOTE — Progress Notes (Deleted)
OFFICE VISIT  07/26/2021  CC: No chief complaint on file.   HPI:    Patient is a 87 y.o. male who presents for 3 mo f/u HTN, chronic venous insufficiency edema, and hx of IDA. A/P as of last visit: "1) HTN, good control overall, mild permissive HTN ok for him. Cont clonidine 0.1mg  tid, hctz 50 qd, valsartan 320 qd, calan SR 240mg  bid. Lytes/cr today.   2) LE edema d/t chronic venous insuff: doing well lately.   3) Generalized weakness/debilitation/postural instability/unsteady gait: needs PT. He doesn't drive/has no car.  His lives with his son who is working a lot and out of town some and would be unable to provide transportation much-->HH PT will be ordered. He has some fatigue in LL's that may be intermittent claudication but I don't think a w/u at this juncture is indicated.     4) Hx of IDA d/t chronic GI bleeding. No sign of overt bleeding.   Plan has been: no endoscopy per pt choice, he is s/p iv and now PO iron long term, monitoring Hb/iron regularly-->all stable with q58mo labs over the last 1 yr, most recently 3 mo ago. Plan on checking cbc and iron studies next f/u in 3-4 mo."  INTERIM HX: ***   Past Medical History:  Diagnosis Date   Arthritis    Ataxia 2014/15   brain MRI 02/2014 showed encephalomalacia parietal and occip lobes c/w old infarcts, plus diffuse cerebral atrophy, o/w normal   Cataract    Chronic renal insufficiency, stage 2 (mild) 12/2016   GFR 60s-70s   DDD (degenerative disc disease)    L-spine   Dermatitis    ?Contact derm? per [facility] MD: Dr. Threasa Alpha (05/09/16): punch bx 06/18/16: chronic eczematous derm/contact derm, neg fungal stain.  Allergy (RAST) testing NEG.   GERD (gastroesophageal reflux disease)    Heme positive stool 02/2012   colonoscopy normal.  EGD showed small hiatal hernia with associated Cameron's erosions.  Hemoccults NEG x 3 09/2014. Hem + iron def 07/2019->conservative mgmt.   Hyperlipidemia    Hypertension     Insomnia    poor sleep hygiene.  Clonidine for his HTN has helped a little.   Iron deficiency anemia 02/2012; 07/2019   2013 Mild iron deficiency->small upper GI erosions, colonoscopy normal.  07/2019->Hb 7.5, heme+, GI eval->pt chose no endoscopy->IV iron and Hb monitoring, PO iron indefinitely: Hb/iron normal on recheck 10/2019 and 12/2019.   Macular degeneration, age related    exudative R eye; nonexudative L eye.  Vitreous degeneration bilat.   Obesity (BMI 30-39.9)    Retinal hemorrhage 2016   w/ retinal detachment (right eye) : anticoagulants stopped due to this   Venous insufficiency of both lower extremities     Past Surgical History:  Procedure Laterality Date   APPENDECTOMY  1935   cataract surg     Bilateral/ 08-13   COLONOSCOPY  05/12/12   for iron def anemia--NORMAL RESULT   ESOPHAGOGASTRODUODENOSCOPY  06/17/12   Small hiatal hernia with associated Cameron's erosions--likely source of his mild iron def anemia.   [place]    Outpatient Medications Prior to Visit  Medication Sig Dispense Refill   Cholecalciferol (VITAMIN D3) 2000 UNITS TABS Take by mouth daily.     cloNIDine (CATAPRES) 0.1 MG tablet TAKE 1 TABLET BY MOUTH TWICE A DAY 180 tablet 0   hydrochlorothiazide (HYDRODIURIL) 50 MG tablet Take 1 tablet (50 mg total)  by mouth daily. 90 tablet 3   Multiple Vitamins-Minerals (PRESERVISION AREDS 2 PO) Take 1 tablet by mouth daily.     omeprazole (PRILOSEC) 40 MG capsule TAKE 1 CAPSULE BY MOUTH EVERY DAY 90 capsule 1   valsartan (DIOVAN) 320 MG tablet TAKE 1 TABLET BY MOUTH EVERY DAY 90 tablet 1   verapamil (CALAN-SR) 240 MG CR tablet TAKE 1 TABLET (240 MG TOTAL) BY MOUTH 2 (TWO) TIMES DAILY. 180 tablet 1   Aflibercept (EYLEA IO) Inject into the eye. One injection into right eye every month  (Patient not taking: Reported on 07/26/2021)     cetirizine (ZYRTEC) 10 MG tablet Take 10 mg by mouth daily. (Patient not taking: Reported on  07/26/2021)  5   ipratropium (ATROVENT) 0.03 % nasal spray PLACE 1-2 SPRAYS IN EACH NOSTRIL ABOUT 10 MINUTES PRIOR TO EACH MEAL (Patient not taking: Reported on 07/26/2021) 30 mL 1   SODIUM FLUORIDE 5000 PPM 1.1 % PSTE Take by mouth daily. (Patient not taking: Reported on 07/26/2021)     tobramycin (TOBREX) 0.3 % ophthalmic solution Place 1 drop into the right eye. Four times daily begin 1 day prior to treatment (Patient not taking: Reported on 07/26/2021)  5   triamcinolone (KENALOG) 0.1 % Apply topically 2 (two) times daily. (Patient not taking: Reported on 07/26/2021)     No facility-administered medications prior to visit.    No Known Allergies  ROS As per HPI  PE: Vitals with BMI 07/26/2021 04/26/2021 04/26/2021  Height 5\' 10"  - -  Weight 195 lbs 3 oz - 194 lbs 13 oz  BMI 60.45 - -  Systolic 409 811 914  Diastolic 78 80 78  Pulse 72 - 74     ***  LABS:  Lab Results  Component Value Date   TSH 2.44 07/08/2019   Lab Results  Component Value Date   WBC 7.4 12/25/2020   HGB 13.7 12/25/2020   HCT 40.1 12/25/2020   MCV 98.5 12/25/2020   PLT 375.0 12/25/2020   Lab Results  Component Value Date   IRON 68 12/25/2020   TIBC 312 12/25/2020   FERRITIN 46 12/25/2020    Lab Results  Component Value Date   CREATININE 1.06 04/26/2021   BUN 27 (H) 04/26/2021   NA 138 04/26/2021   K 3.6 04/26/2021   CL 100 04/26/2021   CO2 27 04/26/2021   Lab Results  Component Value Date   ALT 9 07/08/2019   AST 11 07/08/2019   ALKPHOS 80 07/08/2019   BILITOT 0.3 07/08/2019   Lab Results  Component Value Date   CHOL 192 06/30/2017   Lab Results  Component Value Date   HDL 57.20 06/30/2017   Lab Results  Component Value Date   LDLCALC 104 (H) 06/30/2017   Lab Results  Component Value Date   TRIG 156.0 (H) 06/30/2017   Lab Results  Component Value Date   CHOLHDL 3 06/30/2017   Lab Results  Component Value Date   PSA 3.11 07/08/2019   PSA 1.45 10/08/2010   Lab  Results  Component Value Date   HGBA1C 5.6 01/10/2020     IMPRESSION AND PLAN:  No problem-specific Assessment & Plan notes found for this encounter.   An After Visit Summary was printed and given to the patient.  FOLLOW UP: No follow-ups on file.  Signed:  Crissie Sickles, MD           07/26/2021

## 2021-07-26 NOTE — Progress Notes (Addendum)
OFFICE VISIT  07/26/2021  CC: f/u HTN, LE edema, hx of IDA  HPI:    Patient is a 87 y.o. male who presents for 3 mo f/u HTN, chronic venous insufficiency edema, and hx of IDA. A/P as of last visit:  1) HTN - had some elevated pressures in the 160s and 170s since last visit. Doesn't report headache, change in vision, or head bobbing.    2) LE edema d/t chronic venous insuff: doing well lately. Patient feels his edema has improved and doesn't report tenderness or swelling anywhere else.   3) Generalized weakness/debilitation/postural instability/unsteady gait: PT does not feel PT was helpful. Patient is able to ambulate effectively with a walker around the house. He doesn't drive/has no car.  His lives with his son who is working a lot and out of town some and would be unable to provide transportation much.   4) Hx of IDA d/t chronic GI bleeding. No sign of overt bleeding and denies blood ins tool Does report chronic fatigue   Plan has been: no endoscopy per pt choice, he is s/p iv and now PO iron long term, monitoring Hb/iron regularly  ROS negative (cough, congestion, fever, blood in stool, head bobbing, headache, change in vision, abdominal pain)   INTERIM HX: Patient reports that he is doing and feeling well. Home blood pressures averaging about 150/80.  No low blood pressures.  Sometimes it goes up to the 170s over 90s.  Heart rate average 80s. [place] still does not get out and around much.  He has a good appetite and eats what ever he wants.  He sleeps only about average of 5 hours a night, but some nights almost none.  He has melatonin but has not tried it.  His energy level overall remains pretty low but its improved significantly over the last couple of weeks.  He did get some home PT but he does not feel like it did much good.  His main issue is still the feeling that his knees might give way on him.  Some stiffness after sitting a long time but no pain.  Past Medical History:   Diagnosis Date   Arthritis    Ataxia 2014/15   brain MRI 02/2014 showed encephalomalacia parietal and occip lobes c/w old infarcts, plus diffuse cerebral atrophy, o/w normal   Cataract    Chronic renal insufficiency, stage 2 (mild) 12/2016   GFR 60s-70s   DDD (degenerative disc disease)    L-spine   Dermatitis    ?Contact derm? per [facility] MD: Dr. Threasa Alpha (05/09/16): punch bx 06/18/16: chronic eczematous derm/contact derm, neg fungal stain.  Allergy (RAST) testing NEG.   GERD (gastroesophageal reflux disease)    Heme positive stool 02/2012   colonoscopy normal.  EGD showed small hiatal hernia with associated Cameron's erosions.  Hemoccults NEG x 3 09/2014. Hem + iron def 07/2019->conservative mgmt.   Hyperlipidemia    Hypertension    Insomnia    poor sleep hygiene.  Clonidine for his HTN has helped a little.   Iron deficiency anemia 02/2012; 07/2019   2013 Mild iron deficiency->small upper GI erosions, colonoscopy normal.  07/2019->Hb 7.5, heme+, GI eval->pt chose no endoscopy->IV iron and Hb monitoring, PO iron indefinitely: Hb/iron normal on recheck 10/2019 and 12/2019.   Macular degeneration, age related    exudative R eye; nonexudative L eye.  Vitreous degeneration bilat.   Obesity (BMI 30-39.9)    Retinal hemorrhage 2016   w/ retinal detachment (right eye) :  anticoagulants stopped due to this   Venous insufficiency of both lower extremities     Past Surgical History:  Procedure Laterality Date   APPENDECTOMY  1935   cataract surg     Bilateral/ 08-13   COLONOSCOPY  05/12/12   for iron def anemia--NORMAL RESULT   ESOPHAGOGASTRODUODENOSCOPY  06/17/12   Small hiatal hernia with associated Cameron's erosions--likely source of his mild iron def anemia.   [place]    Outpatient Medications Prior to Visit  Medication Sig Dispense Refill   Cholecalciferol (VITAMIN D3) 2000 UNITS TABS Take by mouth daily.     cloNIDine (CATAPRES)  0.1 MG tablet TAKE 1 TABLET BY MOUTH TWICE A DAY 180 tablet 0   hydrochlorothiazide (HYDRODIURIL) 50 MG tablet Take 1 tablet (50 mg total) by mouth daily. 90 tablet 3   Multiple Vitamins-Minerals (PRESERVISION AREDS 2 PO) Take 1 tablet by mouth daily.     omeprazole (PRILOSEC) 40 MG capsule TAKE 1 CAPSULE BY MOUTH EVERY DAY 90 capsule 1   valsartan (DIOVAN) 320 MG tablet TAKE 1 TABLET BY MOUTH EVERY DAY 90 tablet 1   verapamil (CALAN-SR) 240 MG CR tablet TAKE 1 TABLET (240 MG TOTAL) BY MOUTH 2 (TWO) TIMES DAILY. 180 tablet 1   Aflibercept (EYLEA IO) Inject into the eye. One injection into right eye every month  (Patient not taking: Reported on 07/26/2021)     cetirizine (ZYRTEC) 10 MG tablet Take 10 mg by mouth daily. (Patient not taking: Reported on 07/26/2021)  5   ipratropium (ATROVENT) 0.03 % nasal spray PLACE 1-2 SPRAYS IN EACH NOSTRIL ABOUT 10 MINUTES PRIOR TO EACH MEAL (Patient not taking: Reported on 07/26/2021) 30 mL 1   SODIUM FLUORIDE 5000 PPM 1.1 % PSTE Take by mouth daily. (Patient not taking: Reported on 07/26/2021)     tobramycin (TOBREX) 0.3 % ophthalmic solution Place 1 drop into the right eye. Four times daily begin 1 day prior to treatment (Patient not taking: Reported on 07/26/2021)  5   triamcinolone (KENALOG) 0.1 % Apply topically 2 (two) times daily. (Patient not taking: Reported on 07/26/2021)     No facility-administered medications prior to visit.    No Known Allergies  ROS As per HPI  PE: Vitals with BMI 07/26/2021 04/26/2021 04/26/2021  Height 5\' 10"  - -  Weight 195 lbs 3 oz - 194 lbs 13 oz  BMI 28.78 - -  Systolic 676 720 947  Diastolic 78 80 78  Pulse 72 - 74   Physical Exam Constitutional:      Appearance: Normal appearance.  Cardiovascular:     Rate and Rhythm: Normal rate and regular rhythm.     Pulses: Normal pulses.     Heart sounds: Normal heart sounds.  Pulmonary:     Effort: Pulmonary effort is normal.     Breath sounds: Normal breath sounds.   Musculoskeletal:        General: Normal range of motion.     Right lower leg: Edema present.     Left lower leg: Edema present.  Skin:    General: Skin is dry.  Neurological:     Mental Status: He is alert.  Psychiatric:        Mood and Affect: Mood normal.        Behavior: Behavior normal.   Edema 1+ bilat, symmetric, mid tibia level down to ankle.  Deep pinkish hue to skin of pretibial areas.  No erythema or tenderness to suggest skin infection.  Some mild hyperkeratotic flaking is present.   LABS:  Lab Results  Component Value Date   TSH 2.44 07/08/2019   Lab Results  Component Value Date   WBC 7.4 12/25/2020   HGB 13.7 12/25/2020   HCT 40.1 12/25/2020   MCV 98.5 12/25/2020   PLT 375.0 12/25/2020   Lab Results  Component Value Date   IRON 68 12/25/2020   TIBC 312 12/25/2020   FERRITIN 46 12/25/2020    Lab Results  Component Value Date   CREATININE 1.06 04/26/2021   BUN 27 (H) 04/26/2021   NA 138 04/26/2021   K 3.6 04/26/2021   CL 100 04/26/2021   CO2 27 04/26/2021   Lab Results  Component Value Date   ALT 9 07/08/2019   AST 11 07/08/2019   ALKPHOS 80 07/08/2019   BILITOT 0.3 07/08/2019   Lab Results  Component Value Date   CHOL 192 06/30/2017   Lab Results  Component Value Date   HDL 57.20 06/30/2017   Lab Results  Component Value Date   LDLCALC 104 (H) 06/30/2017   Lab Results  Component Value Date   TRIG 156.0 (H) 06/30/2017   Lab Results  Component Value Date   CHOLHDL 3 06/30/2017   Lab Results  Component Value Date   PSA 3.11 07/08/2019   PSA 1.45 10/08/2010   Lab Results  Component Value Date   HGBA1C 5.6 01/10/2020     IMPRESSION AND PLAN:  No problem-specific Assessment & Plan notes found for this encounter.  Routine CBC, CMP, and Fe labs obtained today  1) HTN - good control overall, mild permissive HTN ok for him. Cont clonidine 0.1mg  bid, hctz 50 qd, valsartan 320 qd, calan SR 240mg  bid. Electrolytes today.  2)  LE edema d/t chronic venous insuff: swelling is minimal and stable at this time. No changes necessary   3) Generalized weakness/debilitation/postural instability/unsteady gait: PT had a course of PT but felt it wasn't useful. Is able to ambulate successfully around the house.  Encouraged him to get out and take a 15 min walk daily.   4) Hx of IDA d/t chronic GI bleeding. No sign of overt bleeding, blood in stool. No melena. Plan has been: no endoscopy per pt choice, he is s/p iv and now PO iron long term, monitoring Hb/iron regularly-->Monitor cbc and iron panel today.  5.) chronic fatigue - patient mentioned he sleeps 3-5 hours a night.  Recommended melatonin for sleep.  An After Visit Summary was printed and given to the patient.  FOLLOW UP:  Routine 3 month follow up  [place]  I personally was present during the history, physical exam, and medical decision-making activities of this service and have verified that the service and findings are accurately documented in the student's note.  Signed:  Crissie Sickles, MD           07/26/2021

## 2021-07-27 LAB — IRON,TIBC AND FERRITIN PANEL
%SAT: 33 % (calc) (ref 20–48)
Ferritin: 64 ng/mL (ref 24–380)
Iron: 104 ug/dL (ref 50–180)
TIBC: 318 mcg/dL (calc) (ref 250–425)

## 2021-07-31 DIAGNOSIS — I639 Cerebral infarction, unspecified: Secondary | ICD-10-CM

## 2021-07-31 HISTORY — DX: Cerebral infarction, unspecified: I63.9

## 2021-08-10 ENCOUNTER — Inpatient Hospital Stay (HOSPITAL_COMMUNITY)
Admission: EM | Admit: 2021-08-10 | Discharge: 2021-08-16 | DRG: 065 | Disposition: A | Discharge status: Skilled Nursing Facility | Payer: Medicare Other | Attending: Internal Medicine | Admitting: Internal Medicine

## 2021-08-10 ENCOUNTER — Emergency Department (HOSPITAL_COMMUNITY): Payer: Medicare Other

## 2021-08-10 ENCOUNTER — Observation Stay (HOSPITAL_COMMUNITY): Payer: Medicare Other

## 2021-08-10 DIAGNOSIS — R000 Tachycardia, unspecified: Secondary | ICD-10-CM | POA: Diagnosis not present

## 2021-08-10 DIAGNOSIS — I63511 Cerebral infarction due to unspecified occlusion or stenosis of right middle cerebral artery: Secondary | ICD-10-CM | POA: Diagnosis not present

## 2021-08-10 DIAGNOSIS — R2990 Unspecified symptoms and signs involving the nervous system: Secondary | ICD-10-CM | POA: Diagnosis present

## 2021-08-10 DIAGNOSIS — Z20822 Contact with and (suspected) exposure to covid-19: Secondary | ICD-10-CM | POA: Diagnosis present

## 2021-08-10 DIAGNOSIS — E871 Hypo-osmolality and hyponatremia: Secondary | ICD-10-CM | POA: Diagnosis present

## 2021-08-10 DIAGNOSIS — R52 Pain, unspecified: Secondary | ICD-10-CM

## 2021-08-10 DIAGNOSIS — E559 Vitamin D deficiency, unspecified: Secondary | ICD-10-CM | POA: Diagnosis present

## 2021-08-10 DIAGNOSIS — S0990XA Unspecified injury of head, initial encounter: Secondary | ICD-10-CM | POA: Diagnosis not present

## 2021-08-10 DIAGNOSIS — R29810 Facial weakness: Secondary | ICD-10-CM | POA: Diagnosis present

## 2021-08-10 DIAGNOSIS — E785 Hyperlipidemia, unspecified: Secondary | ICD-10-CM | POA: Diagnosis present

## 2021-08-10 DIAGNOSIS — D631 Anemia in chronic kidney disease: Secondary | ICD-10-CM | POA: Diagnosis not present

## 2021-08-10 DIAGNOSIS — Z79899 Other long term (current) drug therapy: Secondary | ICD-10-CM

## 2021-08-10 DIAGNOSIS — M6282 Rhabdomyolysis: Secondary | ICD-10-CM | POA: Diagnosis present

## 2021-08-10 DIAGNOSIS — D72829 Elevated white blood cell count, unspecified: Secondary | ICD-10-CM | POA: Diagnosis present

## 2021-08-10 DIAGNOSIS — K219 Gastro-esophageal reflux disease without esophagitis: Secondary | ICD-10-CM | POA: Diagnosis present

## 2021-08-10 DIAGNOSIS — I10 Essential (primary) hypertension: Secondary | ICD-10-CM | POA: Diagnosis not present

## 2021-08-10 DIAGNOSIS — R059 Cough, unspecified: Secondary | ICD-10-CM

## 2021-08-10 DIAGNOSIS — I4891 Unspecified atrial fibrillation: Secondary | ICD-10-CM | POA: Diagnosis not present

## 2021-08-10 DIAGNOSIS — K449 Diaphragmatic hernia without obstruction or gangrene: Secondary | ICD-10-CM | POA: Diagnosis not present

## 2021-08-10 DIAGNOSIS — R471 Dysarthria and anarthria: Secondary | ICD-10-CM | POA: Diagnosis present

## 2021-08-10 DIAGNOSIS — I639 Cerebral infarction, unspecified: Secondary | ICD-10-CM | POA: Diagnosis present

## 2021-08-10 DIAGNOSIS — R29704 NIHSS score 4: Secondary | ICD-10-CM | POA: Diagnosis present

## 2021-08-10 DIAGNOSIS — S199XXA Unspecified injury of neck, initial encounter: Secondary | ICD-10-CM | POA: Diagnosis not present

## 2021-08-10 DIAGNOSIS — R4701 Aphasia: Secondary | ICD-10-CM | POA: Diagnosis present

## 2021-08-10 DIAGNOSIS — Y92009 Unspecified place in unspecified non-institutional (private) residence as the place of occurrence of the external cause: Secondary | ICD-10-CM

## 2021-08-10 DIAGNOSIS — R4781 Slurred speech: Secondary | ICD-10-CM | POA: Diagnosis not present

## 2021-08-10 DIAGNOSIS — M542 Cervicalgia: Secondary | ICD-10-CM | POA: Diagnosis not present

## 2021-08-10 DIAGNOSIS — W07XXXA Fall from chair, initial encounter: Secondary | ICD-10-CM | POA: Diagnosis present

## 2021-08-10 DIAGNOSIS — E876 Hypokalemia: Secondary | ICD-10-CM | POA: Diagnosis present

## 2021-08-10 DIAGNOSIS — N182 Chronic kidney disease, stage 2 (mild): Secondary | ICD-10-CM | POA: Diagnosis present

## 2021-08-10 DIAGNOSIS — H353110 Nonexudative age-related macular degeneration, right eye, stage unspecified: Secondary | ICD-10-CM | POA: Diagnosis present

## 2021-08-10 DIAGNOSIS — I129 Hypertensive chronic kidney disease with stage 1 through stage 4 chronic kidney disease, or unspecified chronic kidney disease: Secondary | ICD-10-CM | POA: Diagnosis present

## 2021-08-10 DIAGNOSIS — R0902 Hypoxemia: Secondary | ICD-10-CM | POA: Diagnosis not present

## 2021-08-10 LAB — URINALYSIS, ROUTINE W REFLEX MICROSCOPIC
Bacteria, UA: NONE SEEN
Bilirubin Urine: NEGATIVE
Glucose, UA: NEGATIVE mg/dL
Ketones, ur: 20 mg/dL — AB
Leukocytes,Ua: NEGATIVE
Nitrite: NEGATIVE
Protein, ur: NEGATIVE mg/dL
Specific Gravity, Urine: 1.024 (ref 1.005–1.030)
pH: 7 (ref 5.0–8.0)

## 2021-08-10 LAB — RAPID URINE DRUG SCREEN, HOSP PERFORMED
Amphetamines: NOT DETECTED
Barbiturates: NOT DETECTED
Benzodiazepines: NOT DETECTED
Cocaine: NOT DETECTED
Opiates: NOT DETECTED
Tetrahydrocannabinol: NOT DETECTED

## 2021-08-10 LAB — I-STAT CHEM 8, ED
BUN: 24 mg/dL — ABNORMAL HIGH (ref 8–23)
Calcium, Ion: 1.03 mmol/L — ABNORMAL LOW (ref 1.15–1.40)
Chloride: 98 mmol/L (ref 98–111)
Creatinine, Ser: 0.9 mg/dL (ref 0.61–1.24)
Glucose, Bld: 120 mg/dL — ABNORMAL HIGH (ref 70–99)
HCT: 44 % (ref 39.0–52.0)
Hemoglobin: 15 g/dL (ref 13.0–17.0)
Potassium: 3.3 mmol/L — ABNORMAL LOW (ref 3.5–5.1)
Sodium: 135 mmol/L (ref 135–145)
TCO2: 27 mmol/L (ref 22–32)

## 2021-08-10 LAB — COMPREHENSIVE METABOLIC PANEL
ALT: 26 U/L (ref 0–44)
AST: 58 U/L — ABNORMAL HIGH (ref 15–41)
Albumin: 3.5 g/dL (ref 3.5–5.0)
Alkaline Phosphatase: 97 U/L (ref 38–126)
Anion gap: 11 (ref 5–15)
BUN: 18 mg/dL (ref 8–23)
CO2: 25 mmol/L (ref 22–32)
Calcium: 9 mg/dL (ref 8.9–10.3)
Chloride: 98 mmol/L (ref 98–111)
Creatinine, Ser: 0.94 mg/dL (ref 0.61–1.24)
GFR, Estimated: >60 mL/min (ref >60)
Glucose, Bld: 120 mg/dL — ABNORMAL HIGH (ref 70–99)
Potassium: 3.3 mmol/L — ABNORMAL LOW (ref 3.5–5.1)
Sodium: 134 mmol/L — ABNORMAL LOW (ref 135–145)
Total Bilirubin: 0.7 mg/dL (ref 0.3–1.2)
Total Protein: 6.6 g/dL (ref 6.5–8.1)

## 2021-08-10 LAB — CBC
HCT: 41 % (ref 39.0–52.0)
Hemoglobin: 14.3 g/dL (ref 13.0–17.0)
MCH: 33.3 pg (ref 26.0–34.0)
MCHC: 34.9 g/dL (ref 30.0–36.0)
MCV: 95.6 fL (ref 80.0–100.0)
Platelets: 356 10*3/uL (ref 150–400)
RBC: 4.29 MIL/uL (ref 4.22–5.81)
RDW: 14.1 % (ref 11.5–15.5)
WBC: 12.6 10*3/uL — ABNORMAL HIGH (ref 4.0–10.5)
nRBC: 0 % (ref 0.0–0.2)

## 2021-08-10 LAB — RESP PANEL BY RT-PCR (FLU A&B, COVID) ARPGX2
Influenza A by PCR: NEGATIVE
Influenza B by PCR: NEGATIVE
SARS Coronavirus 2 by RT PCR: NEGATIVE

## 2021-08-10 LAB — DIFFERENTIAL
Abs Immature Granulocytes: 0.06 10*3/uL (ref 0.00–0.07)
Basophils Absolute: 0 10*3/uL (ref 0.0–0.1)
Basophils Relative: 0 %
Eosinophils Absolute: 0 10*3/uL (ref 0.0–0.5)
Eosinophils Relative: 0 %
Immature Granulocytes: 1 %
Lymphocytes Relative: 5 %
Lymphs Abs: 0.7 10*3/uL (ref 0.7–4.0)
Monocytes Absolute: 1.5 10*3/uL — ABNORMAL HIGH (ref 0.1–1.0)
Monocytes Relative: 12 %
Neutro Abs: 10.3 10*3/uL — ABNORMAL HIGH (ref 1.7–7.7)
Neutrophils Relative %: 82 %

## 2021-08-10 LAB — APTT: aPTT: 23 seconds — ABNORMAL LOW (ref 24–36)

## 2021-08-10 LAB — ETHANOL: Alcohol, Ethyl (B): <10 mg/dL (ref <10)

## 2021-08-10 LAB — PROTIME-INR
INR: 1 (ref 0.8–1.2)
Prothrombin Time: 13.1 seconds (ref 11.4–15.2)

## 2021-08-10 LAB — CK: Total CK: 1185 U/L — ABNORMAL HIGH (ref 49–397)

## 2021-08-10 MED ORDER — STROKE: EARLY STAGES OF RECOVERY BOOK
Freq: Once | Status: AC
  Filled 2021-08-10: qty 1

## 2021-08-10 MED ORDER — CLOPIDOGREL BISULFATE 75 MG PO TABS
75.0000 mg | ORAL_TABLET | Freq: Every day | ORAL | Status: DC
  Administered 2021-08-12 – 2021-08-16 (×5): 75 mg via ORAL
  Filled 2021-08-10 (×6): qty 1

## 2021-08-10 MED ORDER — ACETAMINOPHEN 650 MG RE SUPP: 650.0000 mg | RECTAL | Status: DC | PRN

## 2021-08-10 MED ORDER — LACTATED RINGERS IV SOLN: INTRAVENOUS | Status: DC

## 2021-08-10 MED ORDER — PANTOPRAZOLE SODIUM 40 MG PO TBEC
80.0000 mg | DELAYED_RELEASE_TABLET | Freq: Every day | ORAL | Status: DC
  Administered 2021-08-13 – 2021-08-16 (×4): 80 mg via ORAL
  Filled 2021-08-10 (×5): qty 2

## 2021-08-10 MED ORDER — PROSIGHT PO TABS
1.0000 | ORAL_TABLET | Freq: Every day | ORAL | Status: DC
  Administered 2021-08-12 – 2021-08-16 (×5): 1 via ORAL
  Filled 2021-08-10 (×5): qty 1

## 2021-08-10 MED ORDER — POTASSIUM CHLORIDE 10 MEQ/100ML IV SOLN
10.0000 meq | INTRAVENOUS | Status: AC
  Administered 2021-08-10 – 2021-08-11 (×2): 10 meq via INTRAVENOUS
  Filled 2021-08-10 (×2): qty 100

## 2021-08-10 MED ORDER — IOHEXOL 350 MG/ML SOLN
80.0000 mL | Freq: Once | INTRAVENOUS | Status: AC | PRN
  Administered 2021-08-10: 80 mL via INTRAVENOUS

## 2021-08-10 MED ORDER — ASPIRIN 300 MG RE SUPP
300.0000 mg | Freq: Once | RECTAL | Status: AC
  Administered 2021-08-11: 300 mg via RECTAL
  Filled 2021-08-10: qty 1

## 2021-08-10 MED ORDER — ACETAMINOPHEN 325 MG PO TABS: 650.0000 mg | ORAL_TABLET | ORAL | Status: DC | PRN

## 2021-08-10 MED ORDER — ASPIRIN EC 81 MG PO TBEC
81.0000 mg | DELAYED_RELEASE_TABLET | Freq: Every day | ORAL | Status: DC
  Administered 2021-08-13 – 2021-08-16 (×4): 81 mg via ORAL
  Filled 2021-08-10 (×5): qty 1

## 2021-08-10 MED ORDER — ENOXAPARIN SODIUM 40 MG/0.4ML IJ SOSY
40.0000 mg | PREFILLED_SYRINGE | INTRAMUSCULAR | Status: DC
  Administered 2021-08-11 – 2021-08-16 (×6): 40 mg via SUBCUTANEOUS
  Filled 2021-08-10 (×6): qty 0.4

## 2021-08-10 MED ORDER — VITAMIN D 25 MCG (1000 UNIT) PO TABS
2000.0000 [IU] | ORAL_TABLET | Freq: Every day | ORAL | Status: DC
Start: 2021-08-10 — End: 2021-08-16
  Administered 2021-08-12 – 2021-08-16 (×5): 2000 [IU] via ORAL
  Filled 2021-08-10 (×7): qty 2

## 2021-08-10 MED ORDER — ASPIRIN 300 MG RE SUPP: 300.0000 mg | Freq: Once | RECTAL | Status: DC

## 2021-08-10 MED ORDER — SENNOSIDES-DOCUSATE SODIUM 8.6-50 MG PO TABS
1.0000 | ORAL_TABLET | Freq: Every evening | ORAL | Status: DC | PRN
  Filled 2021-08-10: qty 1

## 2021-08-10 MED ORDER — PRESERVISION AREDS 2 PO CAPS: ORAL_CAPSULE | Freq: Every day | ORAL | Status: DC

## 2021-08-10 MED ORDER — ACETAMINOPHEN 160 MG/5ML PO SOLN: 650.0000 mg | ORAL | Status: DC | PRN

## 2021-08-10 NOTE — H&P (Addendum)
H&P:    Glen Barnett   VEL:[id number] DOB: 1927/04/01 DOA: 08/10/2021  PCP: Tammi Sou, MD  Chief Complaint: Slurred speech, facial droop, aphasia   History of Present Illness:    HPI: Glen Barnett is a 87 y.o. male with a past medical history of ambulatory dysfunction uses a cane to assist with ambulation at baseline, hypertension GERD, vitamin D deficiency.  This patient presents to the emergency department after having a fall last night for EMS approximately 11 PM.  The son found him this morning approximately 1400.  Noted to have aphasia and slurred speech as well as left facial droop.  Alert and oriented x4.  No previous history of TIA or CVA.  Son present upon my evaluation at bedside.  Denies being on any anticoagulation.  No obvious weakness upon my exam of all 4 extremities.  Failed nursing bedside swallow evaluation.  Neurology already aware.  The patient was not a candidate for tPA due to being outside the window.  Not a candidate for mechanical thrombectomy due to no large vessel occlusion.  Last known well time 2300 on 08/09/2021.  ED Course: Work-up here with CT head without contrast with a right posterior frontal lobe hypodensity concerning for stroke with unknown acuity.  CT of the head and neck with no large vessel occlusion.  Lab work was obtained which demonstrates some mild leukocytosis, mild hyponatremia and mild hypokalemia.    ROS:   14 point review of systems is negative except for what is mentioned above in the HPI.   Past Medical History:   Past Medical History:  Diagnosis Date   Arthritis    Ataxia 2014/15   brain MRI 02/2014 showed encephalomalacia parietal and occip lobes c/w old infarcts, plus diffuse cerebral atrophy, o/w normal   Cataract    Chronic renal insufficiency, stage 2 (mild) 12/2016   GFR 60s-70s   DDD (degenerative disc disease)    L-spine   Dermatitis    ?Contact derm? per [facility] MD: Dr. Threasa Alpha (05/09/16): punch bx 06/18/16: chronic eczematous derm/contact derm, neg fungal stain.  Allergy (RAST) testing NEG.   GERD (gastroesophageal reflux disease)    Heme positive stool 02/2012   colonoscopy normal.  EGD showed small hiatal hernia with associated Cameron's erosions.  Hemoccults NEG x 3 09/2014. Hem + iron def 07/2019->conservative mgmt.   Hyperlipidemia    Hypertension    Insomnia    poor sleep hygiene.  Clonidine for his HTN has helped a little.   Iron deficiency anemia 02/2012; 07/2019   2013 Mild iron deficiency->small upper GI erosions, colonoscopy normal.  07/2019->Hb 7.5, heme+, GI eval->pt chose no endoscopy->IV iron and Hb monitoring, PO iron indefinitely: Hb/iron normal on recheck 10/2019 and 12/2019.   Macular degeneration, age related    exudative R eye; nonexudative L eye.  Vitreous degeneration bilat.   Obesity (BMI 30-39.9)    Retinal hemorrhage 2016   w/ retinal detachment (right eye) : anticoagulants stopped due to this   Venous insufficiency of both lower extremities     Past Surgical History:   Past Surgical History:  Procedure Laterality Date   APPENDECTOMY  1935   cataract surg     Bilateral/ 08-13   COLONOSCOPY  05/12/12   for iron def anemia--NORMAL RESULT   ESOPHAGOGASTRODUODENOSCOPY  06/17/12   Small hiatal hernia with associated Cameron's erosions--likely source of his mild iron def anemia.   NASAL SINUS SURGERY  [place]    Social History:   Social History   Socioeconomic History   Marital status: Widowed    Spouse name: Not on file   Number of children: Not on file   Years of education: Not on file   Highest education level: Not on file  Occupational History   Not on file  Tobacco Use   Smoking status: Never   Smokeless tobacco: Never  Vaping Use   Vaping Use: Never used  Substance and Sexual Activity   Alcohol use: No   Drug use: No   Sexual activity: Not Currently  Other Topics Concern   Not on file   Social History Narrative   Widower.  Wife died 09-08-2011 of brain cancer.   He relocated from [place] to live near son.   NO exercise.  No T/A/Ds.   Retired Chief Financial Officer.   Social Determinants of Health   Financial Resource Strain: Low Risk    Difficulty of Paying Living Expenses: Not hard at all  Food Insecurity: No Food Insecurity   Worried About Charity fundraiser in the Last Year: Never true   [place] in the Last Year: Never true  Transportation Needs: No Transportation Needs   Lack of Transportation (Medical): No   Lack of Transportation (Non-Medical): No  Physical Activity: Inactive   Days of Exercise per Week: 0 days   Minutes of Exercise per Session: 0 min  Stress: No Stress Concern Present   Feeling of Stress : Not at all  Social Connections: Socially Isolated   Frequency of Communication with Friends and Family: More than three times a week   Frequency of Social Gatherings with Friends and Family: More than three times a week   Attends Religious Services: Never   Marine scientist or Organizations: No   Attends Archivist Meetings: Never   Marital Status: Widowed  Human resources officer Violence: Not At Risk   Fear of Current or Ex-Partner: No   Emotionally Abused: No   Physically Abused: No   Sexually Abused: No    Allergies:   No Known Allergies  Family History:   Family History  Problem Relation Age of Onset   Liver cancer Mother      Current Medications:   Prior to Admission medications   Medication Sig Start Date End Date Taking? Authorizing Provider  Aflibercept (EYLEA IO) Inject into the eye. One injection into right eye every month   Yes [provider]  Cholecalciferol (VITAMIN D3) 2000 UNITS TABS Take 1 tablet by mouth daily.   Yes [provider]  cloNIDine (CATAPRES) 0.1 MG tablet TAKE 1 TABLET BY MOUTH TWICE A DAY Patient taking differently: Take 0.1 mg by mouth 2 (two) times daily. 05/14/21  Yes  McGowen, Adrian Blackwater, MD  hydrochlorothiazide (HYDRODIURIL) 50 MG tablet Take 1 tablet (50 mg total) by mouth daily. 10/03/20  Yes McGowen, Adrian Blackwater, MD  Multiple Vitamins-Minerals (PRESERVISION AREDS 2 PO) Take 1 tablet by mouth daily.   Yes [provider]  omeprazole (PRILOSEC) 40 MG capsule TAKE 1 CAPSULE BY MOUTH EVERY DAY Patient taking differently: Take 40 mg by mouth daily. 06/11/21  Yes McGowen, Adrian Blackwater, MD  valsartan (DIOVAN) 320 MG tablet TAKE 1 TABLET BY MOUTH EVERY DAY Patient taking differently: Take 320 mg by mouth daily. 06/14/21  Yes McGowen, Adrian Blackwater, MD  verapamil (CALAN-SR) 240 MG CR tablet TAKE 1 TABLET (240 MG TOTAL) BY MOUTH  2 (TWO) TIMES DAILY. 02/20/21  Yes Tammi Sou, MD     Physical Exam:   Vitals:   08/10/21 2000 08/10/21 2030 08/10/21 2045 08/10/21 2108  BP:  (!) 157/98  (!) 183/86  Pulse:  95 90 95  Resp: 19 19 18 19   Temp: 98.9 F (37.2 C)   98.4 F (36.9 C)  TempSrc:    Oral  SpO2:  95% 92% 98%     General:  Appears calm and comfortable and is in NAD Cardiovascular:  RRR, no m/r/g.  Respiratory:   CTA bilaterally with no wheezes/rales/rhonchi.  Normal respiratory effort. Abdomen:  soft, NT, ND, NABS Skin:  no rash or induration seen on limited exam Musculoskeletal:  grossly normal tone BUE/BLE, good ROM, no bony abnormality Lower extremity:  No LE edema.  Limited foot exam with no ulcerations.  2+ distal pulses. Psychiatric:  grossly normal mood and affect, speech fluent and appropriate, AOx3 Neurologic:  CN 2-12 grossly intact, moves all extremities in coordinated fashion, no pronator drift, 5 out of 5 muscle strength in all 4 extremities.  Good hand grip strength.  Moderate left facial droop and obvious aphasia.    Data Review:    Radiological Exams on Admission: Independently reviewed - see discussion in A/P where applicable  CT ANGIO HEAD NECK W WO CM  Result Date: 08/10/2021 CLINICAL DATA:  Stroke suspected, neck pain,  trauma EXAM: CT ANGIOGRAPHY HEAD AND NECK CT CERVICAL SPINE WITHOUT CONTRAST TECHNIQUE: Multidetector CT imaging of the head and neck was performed using the standard protocol during bolus administration of intravenous contrast. Multiplanar CT image reconstructions and MIPs were obtained to evaluate the vascular anatomy. Carotid stenosis measurements (when applicable) are obtained utilizing NASCET criteria, using the distal internal carotid diameter as the denominator. Multidetector CT imaging of the cervical spine was performed without intravenous contrast. Multiplanar CT image reconstructions were also generated. CONTRAST:  50mL OMNIPAQUE IOHEXOL 350 MG/ML SOLN COMPARISON:  No prior CT of the head or neck, correlation is made with MRI head 03/12/2014. FINDINGS: CT HEAD FINDINGS Brain: Area of low density in the lateral right posterior frontal lobe (series 5, image 24), which is new compared to the prior exam and may represent acute or subacute infarct. No acute hemorrhage, mass, mass effect, or midline shift. Advanced cerebral volume loss, not significantly changed compared to 2015. No extra-axial collection. Unchanged size and configuration of the ventricles. Vascular: No hyperdense vessel. Skull: No acute osseous abnormality. Sinuses: Imaged portions are clear. Orbits: Status post bilateral lens replacements. Review of the MIP images confirms the above findings CT NECK FINDINGS Alignment: Trace anterolisthesis C7 on T1, which appears degenerative. Skull base and vertebrae: No acute fracture. No primary bone lesion or focal pathologic process. Soft tissues and spinal canal: No prevertebral fluid or swelling. No visible canal hematoma. Disc levels: Severe disc height loss C3-C7. moderate spinal canal stenosis at C6-C7. Multilevel facet and uncovertebral hypertrophy, with severe neural foraminal narrowing on the right at C3-C4 and C5-C6. Other: None. CTA NECK FINDINGS Aortic arch: Standard branching. Imaged portion  shows no evidence of aneurysm or dissection. No significant stenosis of the major arch vessel origins. Right carotid system: No evidence of dissection, stenosis (50% or greater) or occlusion. Retropharyngeal course. Left carotid system: No evidence of dissection, stenosis (50% or greater) or occlusion. Vertebral arteries: Codominant. No evidence of dissection, stenosis (50% or greater) or occlusion. Other neck: Negative Upper chest: No focal pulmonary opacity or pleural effusion. Review of the MIP images  confirms the above findings CTA HEAD FINDINGS Anterior circulation: Both internal carotid arteries are patent to the termini, with mild irregularity but without stenosis or other abnormality. A1 segments patent. Normal anterior communicating artery. Anterior cerebral arteries are patent to their distal aspects. No M1 stenosis or occlusion. Normal MCA bifurcations. Distal MCA branches are slightly asymmetric but appear perfused. Posterior circulation: Vertebral arteries widely patent to the vertebrobasilar junction without stenosis. The right posteroinferior cerebellar artery is not visualized. Basilar patent to its distal aspect. Superior cerebral arteries patent bilaterally. Focal narrowing of the left P1-P2 junction (series 11, images 105-108). PCAs are otherwise perfused to their distal aspects without stenosis. The posterior communicating arteries are not visualized. Venous sinuses: As permitted by contrast timing, patent. Anatomic variants: None significant Review of the MIP images confirms the above findings IMPRESSION: 1. Area of low density in the lateral right posterior frontal lobe, which is new compared to 2015 but is of indeterminate acuity. If there is concern for infarct in this territory, an MRI is recommended. 2. No acute fracture or traumatic subluxation in the cervical spine. 3. Focal narrowing of the left P1-P2 junction. Otherwise no significant intracranial stenosis. No large vessel occlusion. 4.  No hemodynamically significant stenosis in the neck. These results were called by telephone at the time of interpretation on 08/10/2021 at 6:40 pm to provider [facility] , who verbally acknowledged these results. Electronically Signed   By: Merilyn Baba M.D.   On: 08/10/2021 18:40   DG Chest 1 View  Result Date: 08/10/2021 CLINICAL DATA:  Cough. EXAM: CHEST  1 VIEW COMPARISON:  Chest x-ray 07/13/2019. FINDINGS: There is a large hiatal hernia similar to the prior study. Cardiomediastinal silhouette is stable and within normal limits. There are atherosclerotic calcifications of the aorta. The lungs and costophrenic angles are clear. There is no pneumothorax. No acute fractures are seen. IMPRESSION: 1. Stable large hiatal hernia. 2. No acute cardiopulmonary process. Electronically Signed   By: Ronney Asters M.D.   On: 08/10/2021 21:26   CT C-SPINE NO CHARGE  Result Date: 08/10/2021 CLINICAL DATA:  Stroke suspected, neck pain, trauma EXAM: CT ANGIOGRAPHY HEAD AND NECK CT CERVICAL SPINE WITHOUT CONTRAST TECHNIQUE: Multidetector CT imaging of the head and neck was performed using the standard protocol during bolus administration of intravenous contrast. Multiplanar CT image reconstructions and MIPs were obtained to evaluate the vascular anatomy. Carotid stenosis measurements (when applicable) are obtained utilizing NASCET criteria, using the distal internal carotid diameter as the denominator. Multidetector CT imaging of the cervical spine was performed without intravenous contrast. Multiplanar CT image reconstructions were also generated. CONTRAST:  9mL OMNIPAQUE IOHEXOL 350 MG/ML SOLN COMPARISON:  No prior CT of the head or neck, correlation is made with MRI head 03/12/2014. FINDINGS: CT HEAD FINDINGS Brain: Area of low density in the lateral right posterior frontal lobe (series 5, image 24), which is new compared to the prior exam and may represent acute or subacute infarct. No acute hemorrhage, mass,  mass effect, or midline shift. Advanced cerebral volume loss, not significantly changed compared to 2015. No extra-axial collection. Unchanged size and configuration of the ventricles. Vascular: No hyperdense vessel. Skull: No acute osseous abnormality. Sinuses: Imaged portions are clear. Orbits: Status post bilateral lens replacements. Review of the MIP images confirms the above findings CT NECK FINDINGS Alignment: Trace anterolisthesis C7 on T1, which appears degenerative. Skull base and vertebrae: No acute fracture. No primary bone lesion or focal pathologic process. Soft tissues and spinal canal: No prevertebral  fluid or swelling. No visible canal hematoma. Disc levels: Severe disc height loss C3-C7. moderate spinal canal stenosis at C6-C7. Multilevel facet and uncovertebral hypertrophy, with severe neural foraminal narrowing on the right at C3-C4 and C5-C6. Other: None. CTA NECK FINDINGS Aortic arch: Standard branching. Imaged portion shows no evidence of aneurysm or dissection. No significant stenosis of the major arch vessel origins. Right carotid system: No evidence of dissection, stenosis (50% or greater) or occlusion. Retropharyngeal course. Left carotid system: No evidence of dissection, stenosis (50% or greater) or occlusion. Vertebral arteries: Codominant. No evidence of dissection, stenosis (50% or greater) or occlusion. Other neck: Negative Upper chest: No focal pulmonary opacity or pleural effusion. Review of the MIP images confirms the above findings CTA HEAD FINDINGS Anterior circulation: Both internal carotid arteries are patent to the termini, with mild irregularity but without stenosis or other abnormality. A1 segments patent. Normal anterior communicating artery. Anterior cerebral arteries are patent to their distal aspects. No M1 stenosis or occlusion. Normal MCA bifurcations. Distal MCA branches are slightly asymmetric but appear perfused. Posterior circulation: Vertebral arteries widely  patent to the vertebrobasilar junction without stenosis. The right posteroinferior cerebellar artery is not visualized. Basilar patent to its distal aspect. Superior cerebral arteries patent bilaterally. Focal narrowing of the left P1-P2 junction (series 11, images 105-108). PCAs are otherwise perfused to their distal aspects without stenosis. The posterior communicating arteries are not visualized. Venous sinuses: As permitted by contrast timing, patent. Anatomic variants: None significant Review of the MIP images confirms the above findings IMPRESSION: 1. Area of low density in the lateral right posterior frontal lobe, which is new compared to 2015 but is of indeterminate acuity. If there is concern for infarct in this territory, an MRI is recommended. 2. No acute fracture or traumatic subluxation in the cervical spine. 3. Focal narrowing of the left P1-P2 junction. Otherwise no significant intracranial stenosis. No large vessel occlusion. 4. No hemodynamically significant stenosis in the neck. These results were called by telephone at the time of interpretation on 08/10/2021 at 6:40 pm to provider [facility] , who verbally acknowledged these results. Electronically Signed   By: Merilyn Baba M.D.   On: 08/10/2021 18:40    EKG: Independently reviewed. SR or ectopic atrial rhythm with rate 88 BPM   Labs on Admission: I have personally reviewed the available labs and imaging studies at the time of the admission.  Pertinent labs on Admission: Sodium 134, potassium 3.3, blood glucose 120, WBC 12.6, AST 58     Assessment/Plan:   Left facial droop/aphasia: Likely 2/2 acute cerebrovascular accident.  Obtain MRI of the brain.  No obvious arrhythmia noted on EKG. Continue telemetry.  Neurochecks have been ordered.  Allow for permissive hypertension.  Obtain A1c, lipid panel, TSH.  Obtain echocardiogram. Defer initiation of antiplatelet therapy to neurology. Consult PT/OT/SLP. Failed nursing bedside swallow  therefore keep NPO for now.  Hypertension: Hold home Diovan, Catapres, verapamil, and hydrochlorothiazide to allow for permissive hypertension  Hypokalemia: Mild.  KCl IV 10 M EQ x2  Chest congestion/cough: COVID and Influenza PCR negative. Obtain CXR to rule out pneumonia.  Leukocytosis: Reactive or secondary to above.  Urinalysis unremarkable.  Vitamin D deficiency: Continue home vitamin D3 2000 units daily  GERD: Continue home Prilosec  Macular degeneration: Hold home Eylea    Other information:    Level of Care: Med/tele DVT prophylaxis: Lovenox subcu Code Status: Full code Consults: Neurology Admission status: Observation   Leslee Home DO [facility]  How to contact the [facility] Attending or Consulting provider [place] or covering provider during after hours [place], for this patient?  Check the care team in [facility] and look for a) attending/consulting [facility] provider listed and b) the [facility] team listed Log into [email or web address] and use [place]'s universal password to access. If you do not have the password, please contact the hospital operator. Locate the [facility] provider you are looking for under [facility] and page to a number that you can be directly reached. If you still have difficulty reaching the provider, please page the [facility] (Director on Call) for the Hospitalists listed on amion for assistance.   08/10/2021, 10:21 PM

## 2021-08-10 NOTE — ED Triage Notes (Signed)
Pt BIB [facility] from home c/t fall and possible stroke. Per EMS, pt fell last night around 11pm, and was left on the floor until son came back home around 1400 today. Pt denied hitting head or LOC. LSN 1500 yesterday. Pt exhibiting aphasia. A&O X4 .    BP 168/1041 SAT O2 94%  128 cbg

## 2021-08-10 NOTE — ED Notes (Signed)
Admitting MD at bedside.

## 2021-08-10 NOTE — ED Notes (Signed)
Patient transported to CT 

## 2021-08-10 NOTE — ED Notes (Signed)
MD made aware of pt BP

## 2021-08-10 NOTE — ED Notes (Signed)
Pt stated that he felt like he needed to have BM - pt placed on bed pan - unable to have BM - pt put in brief and primofit replaced

## 2021-08-10 NOTE — ED Provider Notes (Signed)
[facility] EMERGENCY DEPARTMENT Provider Note   CSN: [id number] Arrival date & time: 08/10/21  1541     History Chief Complaint  Patient presents with   Glen Barnett is a 87 y.o. male.   Fall  Patient is a very pleasant 87 year old male who, he has a history of renal insufficiency, he has a history of acid reflux hypertension hyperlipidemia as well as macular degeneration.  He presents to the hospital after being found on the ground beside his recliner at his house with facial droop and slurred speech.  According to the son he last saw him last night or talk to him by phone around 10:00, at 11:00 the patient states he tried to get up to use the bathroom but was unable to walk and collapsed to the ground.  The paramedics were called when the son went over to the house today when he did not see him on the home video monitor and found him in that same location.  He was awake and alert and able to follow commands except for some difficulty speaking obvious left-sided facial droop, transported to the hospital immediately with possible strokelike syndrome.  Patient was placed in a cervical collar  Level 5 caveat applies secondary to the patient's slurred speech and some difficulty with speaking.    Past Medical History:  Diagnosis Date   Arthritis    Ataxia 2014/15   brain MRI 02/2014 showed encephalomalacia parietal and occip lobes c/w old infarcts, plus diffuse cerebral atrophy, o/w normal   Cataract    Chronic renal insufficiency, stage 2 (mild) 12/2016   GFR 60s-70s   DDD (degenerative disc disease)    L-spine   Dermatitis    ?Contact derm? per [facility] MD: Dr. Threasa Alpha (05/09/16): punch bx 06/18/16: chronic eczematous derm/contact derm, neg fungal stain.  Allergy (RAST) testing NEG.   GERD (gastroesophageal reflux disease)    Heme positive stool 02/2012   colonoscopy normal.  EGD showed small hiatal hernia with associated Cameron's  erosions.  Hemoccults NEG x 3 09/2014. Hem + iron def 07/2019->conservative mgmt.   Hyperlipidemia    Hypertension    Insomnia    poor sleep hygiene.  Clonidine for his HTN has helped a little.   Iron deficiency anemia 02/2012; 07/2019   2013 Mild iron deficiency->small upper GI erosions, colonoscopy normal.  07/2019->Hb 7.5, heme+, GI eval->pt chose no endoscopy->IV iron and Hb monitoring, PO iron indefinitely: Hb/iron normal on recheck 10/2019 and 12/2019.   Macular degeneration, age related    exudative R eye; nonexudative L eye.  Vitreous degeneration bilat.   Obesity (BMI 30-39.9)    Retinal hemorrhage 2016   w/ retinal detachment (right eye) : anticoagulants stopped due to this   Venous insufficiency of both lower extremities     Patient Active Problem List   Diagnosis Date Noted   Stroke-like symptoms 08/10/2021   Heme positive stool 07/21/2019   Ataxia 03/10/2014   History of iron deficiency anemia 03/10/2014   HTN (hypertension), benign 03/10/2014   Vasomotor rhinitis 06/07/2013   Insomnia 06/07/2013   Age-related physical debility 06/07/2013   GERD (gastroesophageal reflux disease) 11/14/2012   Skin lesion 10/08/2012   Iron deficiency anemia 08/11/2012   Chronic knee instability 09/12/2011   Health maintenance examination 03/12/2011   HYPERLIPIDEMIA 10/02/2010   ESSENTIAL HYPERTENSION, BENIGN 10/02/2010    Past Surgical History:  Procedure Laterality Date   APPENDECTOMY  1935   cataract surg  Bilateral/ 08-13   COLONOSCOPY  05/12/12   for iron def anemia--NORMAL RESULT   ESOPHAGOGASTRODUODENOSCOPY  06/17/12   Small hiatal hernia with associated Cameron's erosions--likely source of his mild iron def anemia.   NASAL SINUS SURGERY     1998   TONSILLECTOMY  1935       Family History  Problem Relation Age of Onset   Liver cancer Mother     Social History   Tobacco Use   Smoking status: Never   Smokeless tobacco: Never  Vaping Use   Vaping Use: Never  used  Substance Use Topics   Alcohol use: No   Drug use: No    Home Medications Prior to Admission medications   Medication Sig Start Date End Date Taking? Authorizing Provider  Aflibercept (EYLEA IO) Inject into the eye. One injection into right eye every month  Patient not taking: Reported on 07/26/2021    [provider]  cetirizine (ZYRTEC) 10 MG tablet Take 10 mg by mouth daily. Patient not taking: Reported on 07/26/2021 05/28/16   [provider]  Cholecalciferol (VITAMIN D3) 2000 UNITS TABS Take by mouth daily.    [provider]  cloNIDine (CATAPRES) 0.1 MG tablet TAKE 1 TABLET BY MOUTH TWICE A DAY 05/14/21   McGowen, Adrian Blackwater, MD  hydrochlorothiazide (HYDRODIURIL) 50 MG tablet Take 1 tablet (50 mg total) by mouth daily. 10/03/20   McGowen, Adrian Blackwater, MD  ipratropium (ATROVENT) 0.03 % nasal spray PLACE 1-2 SPRAYS IN EACH NOSTRIL ABOUT 10 MINUTES PRIOR TO [facility] MEAL Patient not taking: Reported on 07/26/2021 05/22/20   Tammi Sou, MD  Multiple Vitamins-Minerals (PRESERVISION AREDS 2 PO) Take 1 tablet by mouth daily.    [provider]  omeprazole (PRILOSEC) 40 MG capsule TAKE 1 CAPSULE BY MOUTH EVERY DAY 06/11/21   McGowen, Adrian Blackwater, MD  SODIUM FLUORIDE 5000 PPM 1.1 % PSTE Take by mouth daily. Patient not taking: Reported on 07/26/2021 02/06/21   [provider]  tobramycin (TOBREX) 0.3 % ophthalmic solution Place 1 drop into the right eye. Four times daily begin 1 day prior to treatment Patient not taking: Reported on 07/26/2021 07/26/15   [provider]  triamcinolone (KENALOG) 0.1 % Apply topically 2 (two) times daily. Patient not taking: Reported on 07/26/2021 10/17/20   [provider]  valsartan (DIOVAN) 320 MG tablet TAKE 1 TABLET BY MOUTH EVERY DAY 06/14/21   McGowen, Adrian Blackwater, MD  verapamil (CALAN-SR) 240 MG CR tablet TAKE 1 TABLET (240 MG TOTAL) BY MOUTH 2 (TWO) TIMES DAILY. 02/20/21   McGowen, Adrian Blackwater, MD     Allergies    Patient has no known allergies.  Review of Systems   Review of Systems  Unable to perform ROS: Other   Physical Exam Updated Vital Signs BP (!) 167/95   Pulse 94   Temp 98.2 F (36.8 C) (Oral)   Resp 16   SpO2 98%   Physical Exam Vitals and nursing note reviewed.  Constitutional:      General: He is not in acute distress.    Appearance: He is well-developed.  HENT:     Head: Normocephalic and atraumatic.     Mouth/Throat:     Pharynx: No oropharyngeal exudate.  Eyes:     General: No scleral icterus.       Right eye: No discharge.        Left eye: No discharge.     Conjunctiva/sclera: Conjunctivae normal.     Pupils: Pupils  are equal, round, and reactive to light.  Neck:     Thyroid: No thyromegaly.     Vascular: No JVD.  Cardiovascular:     Rate and Rhythm: Normal rate and regular rhythm.     Heart sounds: Normal heart sounds. No murmur heard.   No friction rub. No gallop.  Pulmonary:     Effort: Pulmonary effort is normal. No respiratory distress.     Breath sounds: Normal breath sounds. No wheezing or rales.  Abdominal:     General: Bowel sounds are normal. There is no distension.     Palpations: Abdomen is soft. There is no mass.     Tenderness: There is no abdominal tenderness.  Musculoskeletal:        General: No tenderness. Normal range of motion.     Cervical back: Normal range of motion and neck supple.  Lymphadenopathy:     Cervical: No cervical adenopathy.  Skin:    General: Skin is warm and dry.     Findings: No erythema or rash.  Neurological:     Mental Status: He is alert.     Coordination: Coordination normal.     Comments: There is left-sided facial droop, the patient has a slurred speech.  The patient is able to move all 4 extremities with normal strength, there is no pronator drift on either the arms or the legs.  The patient is able to answer my questions but slow and slurred in his speech.  He has normal sensation to left  and right side of his face but has decreased sensation of the left upper extremity as well as the left lower extremity.  There is no grip weakness  Psychiatric:        Behavior: Behavior normal.    ED Results / Procedures / Treatments   Labs (all labs ordered are listed, but only abnormal results are displayed) Labs Reviewed  APTT - Abnormal; Notable for the following components:      Result Value   aPTT 23 (*)    All other components within normal limits  CBC - Abnormal; Notable for the following components:   WBC 12.6 (*)    All other components within normal limits  DIFFERENTIAL - Abnormal; Notable for the following components:   Neutro Abs 10.3 (*)    Monocytes Absolute 1.5 (*)    All other components within normal limits  COMPREHENSIVE METABOLIC PANEL - Abnormal; Notable for the following components:   Sodium 134 (*)    Potassium 3.3 (*)    Glucose, Bld 120 (*)    AST 58 (*)    All other components within normal limits  I-STAT CHEM 8, ED - Abnormal; Notable for the following components:   Potassium 3.3 (*)    BUN 24 (*)    Glucose, Bld 120 (*)    Calcium, Ion 1.03 (*)    All other components within normal limits  RESP PANEL BY RT-PCR (FLU A&B, COVID) ARPGX2  ETHANOL  PROTIME-INR  RAPID URINE DRUG SCREEN, HOSP PERFORMED  URINALYSIS, ROUTINE W REFLEX MICROSCOPIC  CK    EKG EKG Interpretation  Date/Time:  Friday August 10 2021 16:05:08 EST Ventricular Rate:  88 PR Interval:  198 QRS Duration: 102 QT Interval:  394 QTC Calculation: 477 R Axis:   -45 Text Interpretation: Sinus or ectopic atrial rhythm Consider left atrial enlargement Probable left ventricular hypertrophy Inferior infarct, old Anterior Q waves, possibly due to LVH Lateral leads are also involved No old tracing  to compare Confirmed by Noemi Chapel [phone]) on 08/10/2021 5:25:40 PM  Radiology CT ANGIO HEAD NECK W WO CM  Result Date: 08/10/2021 CLINICAL DATA:  Stroke suspected, neck pain, trauma  EXAM: CT ANGIOGRAPHY HEAD AND NECK CT CERVICAL SPINE WITHOUT CONTRAST TECHNIQUE: Multidetector CT imaging of the head and neck was performed using the standard protocol during bolus administration of intravenous contrast. Multiplanar CT image reconstructions and MIPs were obtained to evaluate the vascular anatomy. Carotid stenosis measurements (when applicable) are obtained utilizing NASCET criteria, using the distal internal carotid diameter as the denominator. Multidetector CT imaging of the cervical spine was performed without intravenous contrast. Multiplanar CT image reconstructions were also generated. CONTRAST:  65mL OMNIPAQUE IOHEXOL 350 MG/ML SOLN COMPARISON:  No prior CT of the head or neck, correlation is made with MRI head 03/12/2014. FINDINGS: CT HEAD FINDINGS Brain: Area of low density in the lateral right posterior frontal lobe (series 5, image 24), which is new compared to the prior exam and may represent acute or subacute infarct. No acute hemorrhage, mass, mass effect, or midline shift. Advanced cerebral volume loss, not significantly changed compared to 2015. No extra-axial collection. Unchanged size and configuration of the ventricles. Vascular: No hyperdense vessel. Skull: No acute osseous abnormality. Sinuses: Imaged portions are clear. Orbits: Status post bilateral lens replacements. Review of the MIP images confirms the above findings CT NECK FINDINGS Alignment: Trace anterolisthesis C7 on T1, which appears degenerative. Skull base and vertebrae: No acute fracture. No primary bone lesion or focal pathologic process. Soft tissues and spinal canal: No prevertebral fluid or swelling. No visible canal hematoma. Disc levels: Severe disc height loss C3-C7. moderate spinal canal stenosis at C6-C7. Multilevel facet and uncovertebral hypertrophy, with severe neural foraminal narrowing on the right at C3-C4 and C5-C6. Other: None. CTA NECK FINDINGS Aortic arch: Standard branching. Imaged portion shows  no evidence of aneurysm or dissection. No significant stenosis of the major arch vessel origins. Right carotid system: No evidence of dissection, stenosis (50% or greater) or occlusion. Retropharyngeal course. Left carotid system: No evidence of dissection, stenosis (50% or greater) or occlusion. Vertebral arteries: Codominant. No evidence of dissection, stenosis (50% or greater) or occlusion. Other neck: Negative Upper chest: No focal pulmonary opacity or pleural effusion. Review of the MIP images confirms the above findings CTA HEAD FINDINGS Anterior circulation: Both internal carotid arteries are patent to the termini, with mild irregularity but without stenosis or other abnormality. A1 segments patent. Normal anterior communicating artery. Anterior cerebral arteries are patent to their distal aspects. No M1 stenosis or occlusion. Normal MCA bifurcations. Distal MCA branches are slightly asymmetric but appear perfused. Posterior circulation: Vertebral arteries widely patent to the vertebrobasilar junction without stenosis. The right posteroinferior cerebellar artery is not visualized. Basilar patent to its distal aspect. Superior cerebral arteries patent bilaterally. Focal narrowing of the left P1-P2 junction (series 11, images 105-108). PCAs are otherwise perfused to their distal aspects without stenosis. The posterior communicating arteries are not visualized. Venous sinuses: As permitted by contrast timing, patent. Anatomic variants: None significant Review of the MIP images confirms the above findings IMPRESSION: 1. Area of low density in the lateral right posterior frontal lobe, which is new compared to 2015 but is of indeterminate acuity. If there is concern for infarct in this territory, an MRI is recommended. 2. No acute fracture or traumatic subluxation in the cervical spine. 3. Focal narrowing of the left P1-P2 junction. Otherwise no significant intracranial stenosis. No large vessel occlusion. 4. No  hemodynamically  significant stenosis in the neck. These results were called by telephone at the time of interpretation on 08/10/2021 at 6:40 pm to provider [facility] , who verbally acknowledged these results. Electronically Signed   By: Merilyn Baba M.D.   On: 08/10/2021 18:40   CT C-SPINE NO CHARGE  Result Date: 08/10/2021 CLINICAL DATA:  Stroke suspected, neck pain, trauma EXAM: CT ANGIOGRAPHY HEAD AND NECK CT CERVICAL SPINE WITHOUT CONTRAST TECHNIQUE: Multidetector CT imaging of the head and neck was performed using the standard protocol during bolus administration of intravenous contrast. Multiplanar CT image reconstructions and MIPs were obtained to evaluate the vascular anatomy. Carotid stenosis measurements (when applicable) are obtained utilizing NASCET criteria, using the distal internal carotid diameter as the denominator. Multidetector CT imaging of the cervical spine was performed without intravenous contrast. Multiplanar CT image reconstructions were also generated. CONTRAST:  45mL OMNIPAQUE IOHEXOL 350 MG/ML SOLN COMPARISON:  No prior CT of the head or neck, correlation is made with MRI head 03/12/2014. FINDINGS: CT HEAD FINDINGS Brain: Area of low density in the lateral right posterior frontal lobe (series 5, image 24), which is new compared to the prior exam and may represent acute or subacute infarct. No acute hemorrhage, mass, mass effect, or midline shift. Advanced cerebral volume loss, not significantly changed compared to 2015. No extra-axial collection. Unchanged size and configuration of the ventricles. Vascular: No hyperdense vessel. Skull: No acute osseous abnormality. Sinuses: Imaged portions are clear. Orbits: Status post bilateral lens replacements. Review of the MIP images confirms the above findings CT NECK FINDINGS Alignment: Trace anterolisthesis C7 on T1, which appears degenerative. Skull base and vertebrae: No acute fracture. No primary bone lesion or focal pathologic  process. Soft tissues and spinal canal: No prevertebral fluid or swelling. No visible canal hematoma. Disc levels: Severe disc height loss C3-C7. moderate spinal canal stenosis at C6-C7. Multilevel facet and uncovertebral hypertrophy, with severe neural foraminal narrowing on the right at C3-C4 and C5-C6. Other: None. CTA NECK FINDINGS Aortic arch: Standard branching. Imaged portion shows no evidence of aneurysm or dissection. No significant stenosis of the major arch vessel origins. Right carotid system: No evidence of dissection, stenosis (50% or greater) or occlusion. Retropharyngeal course. Left carotid system: No evidence of dissection, stenosis (50% or greater) or occlusion. Vertebral arteries: Codominant. No evidence of dissection, stenosis (50% or greater) or occlusion. Other neck: Negative Upper chest: No focal pulmonary opacity or pleural effusion. Review of the MIP images confirms the above findings CTA HEAD FINDINGS Anterior circulation: Both internal carotid arteries are patent to the termini, with mild irregularity but without stenosis or other abnormality. A1 segments patent. Normal anterior communicating artery. Anterior cerebral arteries are patent to their distal aspects. No M1 stenosis or occlusion. Normal MCA bifurcations. Distal MCA branches are slightly asymmetric but appear perfused. Posterior circulation: Vertebral arteries widely patent to the vertebrobasilar junction without stenosis. The right posteroinferior cerebellar artery is not visualized. Basilar patent to its distal aspect. Superior cerebral arteries patent bilaterally. Focal narrowing of the left P1-P2 junction (series 11, images 105-108). PCAs are otherwise perfused to their distal aspects without stenosis. The posterior communicating arteries are not visualized. Venous sinuses: As permitted by contrast timing, patent. Anatomic variants: None significant Review of the MIP images confirms the above findings IMPRESSION: 1. Area of  low density in the lateral right posterior frontal lobe, which is new compared to 2015 but is of indeterminate acuity. If there is concern for infarct in this territory, an MRI is recommended. 2.  No acute fracture or traumatic subluxation in the cervical spine. 3. Focal narrowing of the left P1-P2 junction. Otherwise no significant intracranial stenosis. No large vessel occlusion. 4. No hemodynamically significant stenosis in the neck. These results were called by telephone at the time of interpretation on 08/10/2021 at 6:40 pm to provider [facility] , who verbally acknowledged these results. Electronically Signed   By: Merilyn Baba M.D.   On: 08/10/2021 18:40    Procedures Procedures   Medications Ordered in ED Medications  iohexol (OMNIPAQUE) 350 MG/ML injection 80 mL (80 mLs Intravenous Contrast Given 08/10/21 1755)    ED Course  I have reviewed the triage vital signs and the nursing notes.  Pertinent labs & imaging results that were available during my care of the patient were reviewed by me and considered in my medical decision making (see chart for details).    MDM Rules/Calculators/A&P                           At this time the patient's exam and symptoms are consistent with an acute neurologic event which likely occurred last night around 11:00 or sometime between 10 and 11 PM.  I discussed the care with the neurology consultant Dr. Maurine Minister who recommends that we do not activate a code stroke but pursue stroke work-up at this time.  I discussed the case with Dr. Joycelyn Schmid of the hospital service who will admit.  Radiology reports that this patient has findings of what is likely ischemic stroke in the right posterior frontal lobe.  Not a candidate for intervention, neurology suggest inpatient work-up for stroke   Final Clinical Impression(s) / ED Diagnoses Final diagnoses:  Acute ischemic stroke [facility])       Noemi Chapel, MD 08/10/21 1919

## 2021-08-10 NOTE — Consult Note (Signed)
Neurology Consultation  Reason for Consult: ? Stroke.  Referring Physician: Hazle Nordmann, MD.   CC: dysarthria.   History is obtained from: son, chart.   HPI: Glen Barnett is a 87 y.o. male with a PMHx of renal insufficiency, GERD, HTN, HLD, iron deficiency anemia due to chronic GIB, venous insufficiency, and macular degeneration. Patient lives alone and family checks on him frequently via camera. Last pm around 2240  hours, son camera in on patient and he was walking and eating as per his normal state of health. Next time son used camera was this am and could not see the patient. Son went over to check on patient this am, and found patient on the floor. Apparently, patient fell out of his recliner last pm and stayed on the floor for hours. Son noticed left facial droop and slurred speech and called 911. Son states patient has been "sick" with a cold a couple of days PTA.   Upon arrival to ED, his slurred speech was still present, but no extremity weakness. No code stroke was called due to excessive time down and LKW around 2240 hours 08/09/21.   He keeps his eyes closed and must be reminded to open them. This is likely due to his macular degeneration. He states he needs to "rest" them. No HA, photophobia, CP, SOB. States his knees hurt a lot. He has a noted cough and appears in discomfort from hard CCollar. C spine cleared and Dr. Sabra Heck removed collar.   In review of chart, He saw his PCP on 07/21/21. Documented history of generalized weakness, debility with unsteady gait. PT was ordered, but unknown if he received any treatment. Last MRIb in 2015 showed an old infarct in parietal and occipital lobes along iwht atrophy and encephalomalacia in same lobes as stroke. NP does not find any neuro notes back to 2012.   Neurology asked to consult due to suspected stroke.   ROS: A robust ROS was performed and is negative except as noted in the HPI.   Past Medical History:  Diagnosis Date    Arthritis    Ataxia 2014/15   brain MRI 02/2014 showed encephalomalacia parietal and occip lobes c/w old infarcts, plus diffuse cerebral atrophy, o/w normal   Cataract    Chronic renal insufficiency, stage 2 (mild) 12/2016   GFR 60s-70s   DDD (degenerative disc disease)    L-spine   Dermatitis    ?Contact derm? per [facility] MD: Dr. Threasa Alpha (05/09/16): punch bx 06/18/16: chronic eczematous derm/contact derm, neg fungal stain.  Allergy (RAST) testing NEG.   GERD (gastroesophageal reflux disease)    Heme positive stool 02/2012   colonoscopy normal.  EGD showed small hiatal hernia with associated Cameron's erosions.  Hemoccults NEG x 3 09/2014. Hem + iron def 07/2019->conservative mgmt.   Hyperlipidemia    Hypertension    Insomnia    poor sleep hygiene.  Clonidine for his HTN has helped a little.   Iron deficiency anemia 02/2012; 07/2019   2013 Mild iron deficiency->small upper GI erosions, colonoscopy normal.  07/2019->Hb 7.5, heme+, GI eval->pt chose no endoscopy->IV iron and Hb monitoring, PO iron indefinitely: Hb/iron normal on recheck 10/2019 and 12/2019.   Macular degeneration, age related    exudative R eye; nonexudative L eye.  Vitreous degeneration bilat.   Obesity (BMI 30-39.9)    Retinal hemorrhage 2016   w/ retinal detachment (right eye) : anticoagulants stopped due to this   Venous insufficiency of both lower extremities  Family History  Problem Relation Age of Onset   Liver cancer Mother    Social History:   reports that he has never smoked. He has never used smokeless tobacco. He reports that he does not drink alcohol and does not use drugs.  Medications No current facility-administered medications for this encounter.  Current Outpatient Medications:    Aflibercept (EYLEA IO), Inject into the eye. One injection into right eye every month  (Patient not taking: Reported on 07/26/2021), Disp: , Rfl:    cetirizine (ZYRTEC) 10 MG tablet, Take 10 mg by mouth daily.  (Patient not taking: Reported on 07/26/2021), Disp: , Rfl: 5   Cholecalciferol (VITAMIN D3) 2000 UNITS TABS, Take by mouth daily., Disp: , Rfl:    cloNIDine (CATAPRES) 0.1 MG tablet, TAKE 1 TABLET BY MOUTH TWICE A DAY, Disp: 180 tablet, Rfl: 0   hydrochlorothiazide (HYDRODIURIL) 50 MG tablet, Take 1 tablet (50 mg total) by mouth daily., Disp: 90 tablet, Rfl: 3   ipratropium (ATROVENT) 0.03 % nasal spray, PLACE 1-2 SPRAYS IN EACH NOSTRIL ABOUT 10 MINUTES PRIOR TO EACH MEAL (Patient not taking: Reported on 07/26/2021), Disp: 30 mL, Rfl: 1   Multiple Vitamins-Minerals (PRESERVISION AREDS 2 PO), Take 1 tablet by mouth daily., Disp: , Rfl:    omeprazole (PRILOSEC) 40 MG capsule, TAKE 1 CAPSULE BY MOUTH EVERY DAY, Disp: 90 capsule, Rfl: 1   SODIUM FLUORIDE 5000 PPM 1.1 % PSTE, Take by mouth daily. (Patient not taking: Reported on 07/26/2021), Disp: , Rfl:    tobramycin (TOBREX) 0.3 % ophthalmic solution, Place 1 drop into the right eye. Four times daily begin 1 day prior to treatment (Patient not taking: Reported on 07/26/2021), Disp: , Rfl: 5   triamcinolone (KENALOG) 0.1 %, Apply topically 2 (two) times daily. (Patient not taking: Reported on 07/26/2021), Disp: , Rfl:    valsartan (DIOVAN) 320 MG tablet, TAKE 1 TABLET BY MOUTH EVERY DAY, Disp: 90 tablet, Rfl: 1   verapamil (CALAN-SR) 240 MG CR tablet, TAKE 1 TABLET (240 MG TOTAL) BY MOUTH 2 (TWO) TIMES DAILY., Disp: 180 tablet, Rfl: 1  Exam: Current vital signs: BP (!) 172/94   Pulse 91   Temp 98.2 F (36.8 C) (Oral)   Resp 16   SpO2 95%  Vital signs in last 24 hours: Temp:  [98.2 F (36.8 C)] 98.2 F (36.8 C) (11/11 1638) Pulse Rate:  [88-92] 91 (11/11 1728) Resp:  [16-21] 16 (11/11 1728) BP: (157-172)/(88-94) 172/94 (11/11 1715) SpO2:  [94 %-97 %] 95 % (11/11 1728)  PE: GENERAL: Chronically ill appearing elderly male in NAD. He is obviously uncomfortable with collar. Awake, alert.   HEENT: normocephalic and atraumatic. + coughing.   LUNGS - Normal respiratory effort.  CV - RRR on tele. ABDOMEN - Soft, nontender. Ext: warm, well perfused. Psych: affect is appropriate for situation. Calm and cooperative.   NEURO:  Mental Status: Alert and oriented to self, age, son, place, month, and year. Not oriented to day or date. He follows commands.  Speech/Language: speech is without dysarthria or aphasia.  Naming, repetition, fluency, and comprehension intact.  Cranial Nerves:  II: PERRL 2 mm and sluggish. Visual fields full.  III, IV, VI: EOMI. Lid elevation symmetric and full.  V: sensation is intact and symmetrical to face.  VII: left facial droop noted.   VIII:hearing intact to voice. IX, X: palate elevation is symmetric. Phonation normal.  XI: normal sternocleidomastoid and trapezius muscle strength. NIO:EVOJJK is symmetrical without fasciculations.   Motor:  5/5  for all muscle groups tested.  Tone is normal. Bulk is normal.  Sensation- Intact to light touch bilaterally in all four extremities. Extinction absent to DSS.  Coordination: FTN with noted ataxia on the right. Unable to perform HKS. No pronator drift.  DTRs:  BUEs-1+ brachioradialis. 2+ bilateral patella.  Gait- deferred.  NIHSS:  1a Level of Consciousness: 0 1b LOC Questions: 0 1c LOC Commands: 0 2 Best Gaze: 0 3 Visual: 0 4 Facial Palsy: 1 5a Motor Arm - left: 0 5b Motor Arm - Right: 0 6a Motor Leg - Left: 0 6b Motor Leg - Right: 0 7 Limb Ataxia: 1 8 Sensory: 0 9 Best Language: 0 10 Dysarthria: 1 11 Extinction and Inattention: 0 TOTAL:  2  Labs I have reviewed labs in epic and the results pertinent to this consultation are: INR  1.0.  aPTT  23. WBCC 12.6. Glucose 120.   CBC    Component Value Date/Time   WBC 12.6 (H) 08/10/2021 1556   RBC 4.29 08/10/2021 1556   HGB 15.0 08/10/2021 1621   HCT 44.0 08/10/2021 1621   PLT 356 08/10/2021 1556   MCV 95.6 08/10/2021 1556   MCH 33.3 08/10/2021 1556   MCHC 34.9 08/10/2021 1556   RDW  14.1 08/10/2021 1556   LYMPHSABS 0.7 08/10/2021 1556   MONOABS 1.5 (H) 08/10/2021 1556   EOSABS 0.0 08/10/2021 1556   BASOSABS 0.0 08/10/2021 1556    CMP     Component Value Date/Time   NA 135 08/10/2021 1621   K 3.3 (L) 08/10/2021 1621   CL 98 08/10/2021 1621   CO2 25 08/10/2021 1556   GLUCOSE 120 (H) 08/10/2021 1621   BUN 24 (H) 08/10/2021 1621   CREATININE 0.90 08/10/2021 1621   CALCIUM 9.0 08/10/2021 1556   PROT 6.6 08/10/2021 1556   ALBUMIN 3.5 08/10/2021 1556   AST 58 (H) 08/10/2021 1556   ALT 26 08/10/2021 1556   ALKPHOS 97 08/10/2021 1556   BILITOT 0.7 08/10/2021 1556   GFRNONAA >60 08/10/2021 1556    Imaging MD reviewed the images obtained.  CTA head and neck -Area of low density in the lateral right posterior frontal lobe, which is new compared to 2015 but is of indeterminate acuity. If there is concern for infarct in this territory, an MRI is recommended. -No acute fracture or traumatic subluxation in the cervical spine. -Focal narrowing of the left P1-P2 junction. Otherwise no significant intracranial stenosis. No large vessel occlusion. -No hemodynamically significant stenosis in the neck.   MRI brain ordered.   Assessment: 87 yo male with stroke risk factors of remote stroke, HTN, HLD, and age. He presented to ED after assuming being down all night with dysarthria and left facial droop. No weakness of extremity noted. CTA with low density in lateral right posterior frontal lobe, new from 2015, but indeterminable acuity. Suspicion is high for stroke given his dysarthria and facial droop. MRIb is pending. He has no limb weakness on exam.   Impression: -Likely stroke, to be clarified further with MRIb.  -Consider seizure given time on floor, but doubt.   Recommendations/Plan:  - Admit to medicine for stroke workup.  - ASA 325mg  given in ED.  - ASA 81mg  po qd and ongoing.  - HgbA1c, fasting lipid panel, TSH.  - Consider starting statin if LDL > 70,  although NP would not do this given his age.  - Frequent neuro checks - follow NIHSS per protocol.  - Echocardiogram - Risk factor modification -  Cardiac telemetry monitoring for arrhythmia - PT consult, OT consult, Speech consult - Stroke education - Stroke team to follow - rEEG.  Pt seen by Clance Boll, NP/Neuro and later by MD. Note/plan to be edited by MD as needed.  Pager: [phone]    NEUROHOSPITALIST ADDENDUM Performed a face to face diagnostic evaluation.   I have reviewed the contents of history and physical exam as documented by PA/ARNP/Resident and agree with above documentation.  I have discussed and formulated the above plan as documented. Edits to the note have been made as needed.  Impression/Key exam findings/Plan: R frontal stroke on CT Head. Exam with dysarthric speech and L facial droop. Suspect this is likely embolic.outside tNK window, too mild symptoms and poor baseline to consider thrombectomy.  Will get a routine EEG given significant time he was on the floor. Stroke workup as above.  Donnetta Simpers, MD [facility] [phone]   If 7pm to 7am, please call on call as listed on AMION.

## 2021-08-11 ENCOUNTER — Observation Stay (HOSPITAL_COMMUNITY): Payer: Medicare Other

## 2021-08-11 ENCOUNTER — Inpatient Hospital Stay (HOSPITAL_COMMUNITY): Payer: Medicare Other

## 2021-08-11 DIAGNOSIS — E785 Hyperlipidemia, unspecified: Secondary | ICD-10-CM | POA: Diagnosis present

## 2021-08-11 DIAGNOSIS — W07XXXA Fall from chair, initial encounter: Secondary | ICD-10-CM | POA: Diagnosis present

## 2021-08-11 DIAGNOSIS — D649 Anemia, unspecified: Secondary | ICD-10-CM | POA: Diagnosis not present

## 2021-08-11 DIAGNOSIS — N182 Chronic kidney disease, stage 2 (mild): Secondary | ICD-10-CM | POA: Diagnosis present

## 2021-08-11 DIAGNOSIS — I639 Cerebral infarction, unspecified: Secondary | ICD-10-CM | POA: Diagnosis not present

## 2021-08-11 DIAGNOSIS — Z79899 Other long term (current) drug therapy: Secondary | ICD-10-CM | POA: Diagnosis not present

## 2021-08-11 DIAGNOSIS — I10 Essential (primary) hypertension: Secondary | ICD-10-CM | POA: Diagnosis not present

## 2021-08-11 DIAGNOSIS — M259 Joint disorder, unspecified: Secondary | ICD-10-CM | POA: Diagnosis not present

## 2021-08-11 DIAGNOSIS — Y92009 Unspecified place in unspecified non-institutional (private) residence as the place of occurrence of the external cause: Secondary | ICD-10-CM | POA: Diagnosis not present

## 2021-08-11 DIAGNOSIS — R279 Unspecified lack of coordination: Secondary | ICD-10-CM | POA: Diagnosis not present

## 2021-08-11 DIAGNOSIS — I6389 Other cerebral infarction: Secondary | ICD-10-CM

## 2021-08-11 DIAGNOSIS — H353110 Nonexudative age-related macular degeneration, right eye, stage unspecified: Secondary | ICD-10-CM | POA: Diagnosis present

## 2021-08-11 DIAGNOSIS — I872 Venous insufficiency (chronic) (peripheral): Secondary | ICD-10-CM | POA: Diagnosis not present

## 2021-08-11 DIAGNOSIS — R471 Dysarthria and anarthria: Secondary | ICD-10-CM | POA: Diagnosis present

## 2021-08-11 DIAGNOSIS — G319 Degenerative disease of nervous system, unspecified: Secondary | ICD-10-CM | POA: Diagnosis not present

## 2021-08-11 DIAGNOSIS — H3530 Unspecified macular degeneration: Secondary | ICD-10-CM | POA: Diagnosis not present

## 2021-08-11 DIAGNOSIS — Z743 Need for continuous supervision: Secondary | ICD-10-CM | POA: Diagnosis not present

## 2021-08-11 DIAGNOSIS — R4701 Aphasia: Secondary | ICD-10-CM | POA: Diagnosis present

## 2021-08-11 DIAGNOSIS — D631 Anemia in chronic kidney disease: Secondary | ICD-10-CM | POA: Diagnosis present

## 2021-08-11 DIAGNOSIS — E876 Hypokalemia: Secondary | ICD-10-CM | POA: Diagnosis present

## 2021-08-11 DIAGNOSIS — I63511 Cerebral infarction due to unspecified occlusion or stenosis of right middle cerebral artery: Secondary | ICD-10-CM | POA: Diagnosis present

## 2021-08-11 DIAGNOSIS — D72829 Elevated white blood cell count, unspecified: Secondary | ICD-10-CM | POA: Diagnosis present

## 2021-08-11 DIAGNOSIS — E7800 Pure hypercholesterolemia, unspecified: Secondary | ICD-10-CM | POA: Diagnosis not present

## 2021-08-11 DIAGNOSIS — M6282 Rhabdomyolysis: Secondary | ICD-10-CM

## 2021-08-11 DIAGNOSIS — R29810 Facial weakness: Secondary | ICD-10-CM | POA: Diagnosis present

## 2021-08-11 DIAGNOSIS — R29704 NIHSS score 4: Secondary | ICD-10-CM | POA: Diagnosis present

## 2021-08-11 DIAGNOSIS — K219 Gastro-esophageal reflux disease without esophagitis: Secondary | ICD-10-CM | POA: Diagnosis present

## 2021-08-11 DIAGNOSIS — I69351 Hemiplegia and hemiparesis following cerebral infarction affecting right dominant side: Secondary | ICD-10-CM | POA: Diagnosis not present

## 2021-08-11 DIAGNOSIS — I129 Hypertensive chronic kidney disease with stage 1 through stage 4 chronic kidney disease, or unspecified chronic kidney disease: Secondary | ICD-10-CM | POA: Diagnosis present

## 2021-08-11 DIAGNOSIS — E559 Vitamin D deficiency, unspecified: Secondary | ICD-10-CM | POA: Diagnosis present

## 2021-08-11 DIAGNOSIS — E871 Hypo-osmolality and hyponatremia: Secondary | ICD-10-CM | POA: Diagnosis present

## 2021-08-11 DIAGNOSIS — R52 Pain, unspecified: Secondary | ICD-10-CM | POA: Diagnosis not present

## 2021-08-11 DIAGNOSIS — Z20822 Contact with and (suspected) exposure to covid-19: Secondary | ICD-10-CM | POA: Diagnosis present

## 2021-08-11 HISTORY — PX: TRANSTHORACIC ECHOCARDIOGRAM: SHX275

## 2021-08-11 LAB — ECHOCARDIOGRAM COMPLETE
AR max vel: 1.4 cm2
AV Area VTI: 1.31 cm2
AV Area mean vel: 1.4 cm2
AV Mean grad: 8 mmHg
AV Peak grad: 15.5 mmHg
Ao pk vel: 1.97 m/s
Calc EF: 50.7 %
S' Lateral: 2.7 cm
Single Plane A2C EF: 35.6 %
Single Plane A4C EF: 63.1 %

## 2021-08-11 LAB — LIPID PANEL
Cholesterol: 243 mg/dL — ABNORMAL HIGH (ref 0–200)
HDL: 68 mg/dL (ref >40)
LDL Cholesterol: 159 mg/dL — ABNORMAL HIGH (ref 0–99)
Total CHOL/HDL Ratio: 3.6 RATIO
Triglycerides: 81 mg/dL (ref <150)
VLDL: 16 mg/dL (ref 0–40)

## 2021-08-11 LAB — TSH: TSH: 3.26 u[IU]/mL (ref 0.350–4.500)

## 2021-08-11 LAB — HEMOGLOBIN A1C
Hgb A1c MFr Bld: 5.8 % — ABNORMAL HIGH (ref 4.8–5.6)
Mean Plasma Glucose: 119.76 mg/dL

## 2021-08-11 LAB — CK: Total CK: 940 U/L — ABNORMAL HIGH (ref 49–397)

## 2021-08-11 MED ORDER — ONDANSETRON HCL 4 MG/2ML IJ SOLN: 4.0000 mg | Freq: Four times a day (QID) | INTRAMUSCULAR | Status: DC | PRN

## 2021-08-11 MED ORDER — PERFLUTREN LIPID MICROSPHERE
1.0000 mL | INTRAVENOUS | Status: AC | PRN
Start: 2021-08-11 — End: 2021-08-11
  Administered 2021-08-11: 2 mL via INTRAVENOUS
  Filled 2021-08-11: qty 10

## 2021-08-11 NOTE — Evaluation (Signed)
Speech Language Pathology Evaluation Patient Details Name: Glen Barnett MRN: [id number] DOB: [birth year] Today's Date: 08/11/2021 Time: 1203-1230 SLP Time Calculation (min) (ACUTE ONLY): 27 min  Problem List:  Patient Active Problem List   Diagnosis Date Noted   Acute CVA (cerebrovascular accident) ([place]) 08/11/2021   Stroke-like symptoms 08/10/2021   Heme positive stool 07/21/2019   Ataxia 03/10/2014   History of iron deficiency anemia 03/10/2014   HTN (hypertension), benign 03/10/2014   Vasomotor rhinitis 06/07/2013   Insomnia 06/07/2013   Age-related physical debility 06/07/2013   GERD (gastroesophageal reflux disease) 11/14/2012   Skin lesion 10/08/2012   Iron deficiency anemia 08/11/2012   Chronic knee instability 09/12/2011   Health maintenance examination 03/12/2011   HYPERLIPIDEMIA 10/02/2010   ESSENTIAL HYPERTENSION, BENIGN 10/02/2010   Past Medical History:  Past Medical History:  Diagnosis Date   Arthritis    Ataxia 2014/15   brain MRI 02/2014 showed encephalomalacia parietal and occip lobes c/w old infarcts, plus diffuse cerebral atrophy, o/w normal   Cataract    Chronic renal insufficiency, stage 2 (mild) 12/2016   GFR 60s-70s   DDD (degenerative disc disease)    L-spine   Dermatitis    ?Contact derm? per [facility] MD: Dr. Threasa Alpha (05/09/16): punch bx 06/18/16: chronic eczematous derm/contact derm, neg fungal stain.  Allergy (RAST) testing NEG.   GERD (gastroesophageal reflux disease)    Heme positive stool 02/2012   colonoscopy normal.  EGD showed small hiatal hernia with associated Cameron's erosions.  Hemoccults NEG x 3 09/2014. Hem + iron def 07/2019->conservative mgmt.   Hyperlipidemia    Hypertension    Insomnia    poor sleep hygiene.  Clonidine for his HTN has helped a little.   Iron deficiency anemia 02/2012; 07/2019   2013 Mild iron deficiency->small upper GI erosions, colonoscopy normal.  07/2019->Hb 7.5, heme+, GI eval->pt chose no  endoscopy->IV iron and Hb monitoring, PO iron indefinitely: Hb/iron normal on recheck 10/2019 and 12/2019.   Macular degeneration, age related    exudative R eye; nonexudative L eye.  Vitreous degeneration bilat.   Obesity (BMI 30-39.9)    Retinal hemorrhage 2016   w/ retinal detachment (right eye) : anticoagulants stopped due to this   Venous insufficiency of both lower extremities    Past Surgical History:  Past Surgical History:  Procedure Laterality Date   APPENDECTOMY  1935   cataract surg     Bilateral/ 08-13   COLONOSCOPY  05/12/12   for iron def anemia--NORMAL RESULT   ESOPHAGOGASTRODUODENOSCOPY  06/17/12   Small hiatal hernia with associated Cameron's erosions--likely source of his mild iron def anemia.   NASAL SINUS SURGERY     1998   TONSILLECTOMY  1935   HPI:  Pt is a 87 y.o. male who presented to the ED after sustaining a fall at home. He was found to have slurred speech and aphasia by his family. MRI showed right MCA affecting frontal and parietal regions. Failed [facility] Swallow Screen 08/10/21. PMH: ambulatory dysfunction uses a cane to assist with ambulation at baseline, hypertension GERD, vitamin D deficiency.   Assessment / Plan / Recommendation Clinical Impression  Pt presents with dysarthria and cognitive communication deficits post CVA. Speech in simple conversation is c/b impaired articulatory precision, resulting in mild-moderately reduced intelligibility. Expressive and receptive language appears [facility], although pt demonstrates poor ability in following complex 2 step commands. Suspect cognitive component vs true language deficit. He is oriented x3 and demonstrates difficulty in recall of concepts related  to time. Delayed recall (2/3) and sustained/selective attention is poor with pt requiring intermittent redirections to task and repetition of instructions. Son, at bedside, reports that this is different from pt's baseline function. Recommend SLP f/u to address motor speech  and cognitive functions during acute stay.    SLP Assessment  SLP Recommendation/Assessment: Patient needs continued Speech Lanaguage Pathology Services SLP Visit Diagnosis: Cognitive communication deficit (R41.841);Dysarthria and anarthria (R47.1)    Recommendations for follow up therapy are one component of a multi-disciplinary discharge planning process, led by the attending physician.  Recommendations may be updated based on patient status, additional functional criteria and insurance authorization.    Follow Up Recommendations  Acute inpatient rehab (3hours/day)    Assistance Recommended at Discharge  Frequent or constant Supervision/Assistance  Functional Status Assessment Patient has had a recent decline in their functional status and demonstrates the ability to make significant improvements in function in a reasonable and predictable amount of time.  Frequency and Duration min 2x/week  2 weeks      SLP Evaluation Cognition  Overall Cognitive Status: Impaired/Different from baseline Arousal/Alertness: Awake/alert Orientation Level: Oriented to person;Oriented to place;Oriented to situation;Disoriented to time Year: 2022 Month: January Day of Week: Incorrect Attention: Selective Selective Attention: Impaired Selective Attention Impairment: Verbal basic;Functional basic Memory: Impaired Memory Impairment: Storage deficit;Retrieval deficit;Decreased recall of new information Immediate Memory Recall:  (3/3 immediate, 2/3 delayed; SLUMS word stimuli) Awareness: Impaired Awareness Impairment: Intellectual impairment Problem Solving: Impaired Problem Solving Impairment: Functional basic       Comprehension  Auditory Comprehension Overall Auditory Comprehension: Impaired Yes/No Questions: Within Functional Limits Commands: Impaired One Step Basic Commands: 75-100% accurate Two Step Basic Commands: 75-100% accurate Complex Commands: 50-74% accurate Conversation:  Simple Interfering Components: Attention;Working Field seismologist: Tour manager: Not tested Reading Comprehension Reading Status: Not tested    Expression Expression Primary Mode of Expression: Verbal Verbal Expression Overall Verbal Expression: Appears within functional limits for tasks assessed Written Expression Dominant Hand: Left Written Expression: Not tested   Oral / Motor  Oral Motor/Sensory Function Overall Oral Motor/Sensory Function: Moderate impairment Facial ROM: Reduced left;Suspected CN VII (facial) dysfunction Facial Symmetry: Abnormal symmetry left;Suspected CN VII (facial) dysfunction Facial Strength: Reduced left;Suspected CN VII (facial) dysfunction Facial Sensation: Reduced left;Suspected CN V (Trigeminal) dysfunction Lingual ROM: Reduced right;Suspected CN XII (hypoglossal) dysfunction Lingual Symmetry: Abnormal symmetry left;Suspected CN XII (hypoglossal) dysfunction Motor Speech Overall Motor Speech: Impaired Respiration: Within functional limits Phonation: Normal Resonance: Within functional limits Articulation: Impaired Level of Impairment: Sentence Intelligibility: Intelligibility reduced Word: 75-100% accurate Phrase: 50-74% accurate Sentence: 50-74% accurate Conversation: 25-49% accurate Motor Planning: Witnin functional limits Motor Speech Errors: [place], [place], [place] Office Number: [phone]  Acie Fredrickson 08/11/2021, 1:11 PM

## 2021-08-11 NOTE — Progress Notes (Signed)
EEG done at bedside. No skin breakdown noted. Results pending. 

## 2021-08-11 NOTE — Evaluation (Signed)
Clinical/Bedside Swallow Evaluation Patient Details  Name: Glen Barnett MRN: [id number] Date of Birth: 08/20/1927  Today's Date: 08/11/2021 Time: SLP Start Time (ACUTE ONLY): 5638 SLP Stop Time (ACUTE ONLY): 1202 SLP Time Calculation (min) (ACUTE ONLY): 14 min  Past Medical History:  Past Medical History:  Diagnosis Date   Arthritis    Ataxia 2014/15   brain MRI 02/2014 showed encephalomalacia parietal and occip lobes c/w old infarcts, plus diffuse cerebral atrophy, o/w normal   Cataract    Chronic renal insufficiency, stage 2 (mild) 12/2016   GFR 60s-70s   DDD (degenerative disc disease)    L-spine   Dermatitis    ?Contact derm? per [facility] MD: Dr. Threasa Alpha (05/09/16): punch bx 06/18/16: chronic eczematous derm/contact derm, neg fungal stain.  Allergy (RAST) testing NEG.   GERD (gastroesophageal reflux disease)    Heme positive stool 02/2012   colonoscopy normal.  EGD showed small hiatal hernia with associated Cameron's erosions.  Hemoccults NEG x 3 09/2014. Hem + iron def 07/2019->conservative mgmt.   Hyperlipidemia    Hypertension    Insomnia    poor sleep hygiene.  Clonidine for his HTN has helped a little.   Iron deficiency anemia 02/2012; 07/2019   2013 Mild iron deficiency->small upper GI erosions, colonoscopy normal.  07/2019->Hb 7.5, heme+, GI eval->pt chose no endoscopy->IV iron and Hb monitoring, PO iron indefinitely: Hb/iron normal on recheck 10/2019 and 12/2019.   Macular degeneration, age related    exudative R eye; nonexudative L eye.  Vitreous degeneration bilat.   Obesity (BMI 30-39.9)    Retinal hemorrhage 2016   w/ retinal detachment (right eye) : anticoagulants stopped due to this   Venous insufficiency of both lower extremities    Past Surgical History:  Past Surgical History:  Procedure Laterality Date   APPENDECTOMY  1935   cataract surg     Bilateral/ 08-13   COLONOSCOPY  05/12/12   for iron def anemia--NORMAL RESULT    ESOPHAGOGASTRODUODENOSCOPY  06/17/12   Small hiatal hernia with associated Cameron's erosions--likely source of his mild iron def anemia.   NASAL SINUS SURGERY     1998   TONSILLECTOMY  1935   HPI:  Pt is a 87 y.o. male who presented to the ED after sustaining a fall at home. He was found to have slurred speech and aphasia by his family. MRI showed right MCA affecting frontal and parietal regions. Failed [facility] Swallow Screen 08/10/21. PMH: ambulatory dysfunction uses a cane to assist with ambulation at baseline, hypertension GERD, vitamin D deficiency.    Assessment / Plan / Recommendation  Clinical Impression  Pt seen for bedside swallow evaluation, alert and repostioned upright in bed. Oral mechanism examination significant for L labial asymmetry and lingual deviation to the left. Note, pt with baseline cough and mild wet vocal quality upon SLP arrival, which raises concern for reduced secretion management. Overt coughing and increased wet vocal qualtiy observed with large ice chips and sips of thin liquids via cup/straw. Small ice chips and sips of thin were less consistent for s/sx, however they were not totally eliminated. NTL via straw were without overt s/sx of aspiration across several trials. Residue of regular textures observed in L buccal cavity and only partially cleared with lingual/finger sweep and liquid wash. Oral cavity ultimately cleared with swab. Vocal quality after POs became increasingly wet and thin secretions were noted in oral cavity. Cued pt to clear throat and swallow which cleared vocal quality. Recommend initiation of dys  1, NTL diet with SLP to f/u for tolerance. Instrumental assessment of swallow function is likely to be warranted. Above discussed with family and RN.  SLP Visit Diagnosis: Dysphagia, unspecified (R13.10)    Aspiration Risk  Moderate aspiration risk    Diet Recommendation Dysphagia 1 (Puree);Nectar-thick liquid   Liquid Administration via:  Straw;Cup Medication Administration: Crushed with puree Supervision: Full supervision/cueing for compensatory strategies Compensations: Minimize environmental distractions;Slow rate;Small sips/bites;Lingual sweep for clearance of pocketing;Monitor for anterior loss;Clear throat intermittently Postural Changes: Seated upright at 90 degrees;Remain upright for at least 30 minutes after po intake    Other  Recommendations Oral Care Recommendations: Oral care BID;Staff/trained caregiver to provide oral care Other Recommendations: Order thickener from pharmacy    Recommendations for follow up therapy are one component of a multi-disciplinary discharge planning process, led by the attending physician.  Recommendations may be updated based on patient status, additional functional criteria and insurance authorization.  Follow up Recommendations Acute inpatient rehab (3hours/day)      Assistance Recommended at Discharge Frequent or constant Supervision/Assistance  Functional Status Assessment Patient has had a recent decline in their functional status and demonstrates the ability to make significant improvements in function in a reasonable and predictable amount of time.  Frequency and Duration min 2x/week  2 weeks       Prognosis Prognosis for Safe Diet Advancement: Good Barriers to Reach Goals: Cognitive deficits      Swallow Study   General Date of Onset: 08/10/21 HPI: Pt is a 87 y.o. male who presented to the ED after sustaining a fall at home. He was found to have slurred speech and aphasia by his family. MRI showed right MCA affecting frontal and parietal regions. Failed [facility] Swallow Screen 08/10/21. PMH: ambulatory dysfunction uses a cane to assist with ambulation at baseline, hypertension GERD, vitamin D deficiency. Type of Study: Bedside Swallow Evaluation Previous Swallow Assessment: none per EMR Diet Prior to this Study: NPO Temperature Spikes Noted: No Respiratory Status: Room  air History of Recent Intubation: No Behavior/Cognition: Alert;Cooperative;Pleasant mood;Confused Oral Cavity Assessment: Within Functional Limits Oral Care Completed by SLP: Yes Oral Cavity - Dentition: Adequate natural dentition Vision: Functional for self-feeding Self-Feeding Abilities: Needs assist Patient Positioning: Upright in bed;Postural control adequate for testing Baseline Vocal Quality: Wet;Normal Volitional Cough: Strong Volitional Swallow: Able to elicit    Oral/Motor/Sensory Function Overall Oral Motor/Sensory Function: Moderate impairment Facial ROM: Reduced left;Suspected CN VII (facial) dysfunction Facial Symmetry: Abnormal symmetry left;Suspected CN VII (facial) dysfunction Facial Strength: Reduced left;Suspected CN VII (facial) dysfunction Facial Sensation: Reduced left;Suspected CN V (Trigeminal) dysfunction Lingual ROM: Reduced right;Suspected CN XII (hypoglossal) dysfunction Lingual Symmetry: Abnormal symmetry left;Suspected CN XII (hypoglossal) dysfunction   Ice Chips Ice chips: Impaired Presentation: Spoon Oral Phase Impairments: Reduced labial seal Oral Phase Functional Implications: Left anterior spillage Pharyngeal Phase Impairments: Cough - Delayed;Cough - Immediate;Wet Vocal Quality   Thin Liquid Thin Liquid: Impaired Presentation: Cup;Straw;Self Fed Oral Phase Impairments: Reduced labial seal Oral Phase Functional Implications: Left anterior spillage Pharyngeal  Phase Impairments: Cough - Delayed;Cough - Immediate;Wet Vocal Quality    Nectar Thick Nectar Thick Liquid: Within functional limits Presentation: Straw   Honey Thick Honey Thick Liquid: Not tested   Puree Puree: Within functional limits Presentation: Spoon   Solid     Solid: Impaired Presentation: Self Fed Oral Phase Functional Implications: Left lateral sulci pocketing     Ellwood Dense, [place], [place] Office Number: [phone]  Acie Fredrickson 08/11/2021,12:50 PM

## 2021-08-11 NOTE — Evaluation (Signed)
Occupational Therapy Evaluation Patient Details Name: Glen Barnett MRN: [id number] DOB: [birth year] Today's Date: 08/11/2021   History of Present Illness 87 y.o. male with a past medical history of ambulatory dysfunction uses a cane to assist with ambulation at baseline, hypertension GERD, vitamin D deficiency presented to the emergency department after sustaining a fall at home. MRI showed right MCA territory infarcts.   Clinical Impression   Pt admitted with concerns listed above. PTA pt reported that he was independent with all ADL's and assisted his family with home IADL's. At this time, pt presents with cognitive deficits, visual deficits, weakness, and balance deficits. Pt requiring min-max A +1-2 for all ADL's and functional mobility, to ensure safety and pt ability to complete tasks. Recommend acute inpatient rehab for pt to maximize his return to independence and reduce caregiver burden at home. OT will follow acutely to address concerns listed below.      Recommendations for follow up therapy are one component of a multi-disciplinary discharge planning process, led by the attending physician.  Recommendations may be updated based on patient status, additional functional criteria and insurance authorization.   Follow Up Recommendations  Acute inpatient rehab (3hours/day)    Assistance Recommended at Discharge Frequent or constant Supervision/Assistance  Functional Status Assessment  Patient has had a recent decline in their functional status and demonstrates the ability to make significant improvements in function in a reasonable and predictable amount of time.  Equipment Recommendations  Other (comment) (TBD)    Recommendations for Other Services Rehab consult     Precautions / Restrictions Precautions Precautions: Fall Restrictions Weight Bearing Restrictions: No      Mobility Bed Mobility Overal bed mobility: Needs Assistance Bed Mobility: Supine to Sit;Sit  to Supine     Supine to sit: Min assist Sit to supine: Min guard   General bed mobility comments: Min assist to facilitate transfer to EOB from supine. Min guard for safety back into bed. Requires verbal cues for technique. requires extra time.    Transfers Overall transfer level: Needs assistance Equipment used: Rolling walker (2 wheels) Transfers: Sit to/from Stand Sit to Stand: Min assist           General transfer comment: Min assist for boost to stand from bed, cues for hand placement. Upon standing stable with UE support on RW.      Balance Overall balance assessment: Needs assistance Sitting-balance support: No upper extremity supported;Feet supported Sitting balance-Leahy Scale: Good Sitting balance - Comments: slight Rightward lean but able to self correct with VC only. Postural control: Right lateral lean Standing balance support: No upper extremity supported Standing balance-Leahy Scale: Fair                             ADL either performed or assessed with clinical judgement   ADL Overall ADL's : Needs assistance/impaired Eating/Feeding: Moderate assistance Eating/Feeding Details (indicate cue type and reason): Needs full time supervision while eating Grooming: Minimal assistance;Sitting   Upper Body Bathing: Minimal assistance;Sitting   Lower Body Bathing: Maximal assistance;+2 for safety/equipment;Sitting/lateral leans;Sit to/from stand   Upper Body Dressing : Minimal assistance;Sitting   Lower Body Dressing: Moderate assistance;Sitting/lateral leans;Sit to/from stand;+2 for safety/equipment   Toilet Transfer: Moderate assistance;+2 for safety/equipment;Stand-pivot   Toileting- Clothing Manipulation and Hygiene: +2 for safety/equipment;Maximal assistance;Sit to/from stand;Sitting/lateral lean       Functional mobility during ADLs: Moderate assistance;+2 for safety/equipment;Rolling walker (2 wheels) General ADL Comments: Pt overall  at a  mod level due to weakness and difficulty sequencing     Vision Baseline Vision/History: 1 Wears glasses Ability to See in Adequate Light: 1 Impaired Patient Visual Report: Central vision impairment (Macular Degeneration) Vision Assessment?: Vision impaired- to be further tested in functional context Additional Comments: Pt unable to see top half of his lunch plate, did not know there was more food up there. Unable to assess as pt had difficulty keeping his eyes open for extend periods of time.     Perception     Praxis      Pertinent Vitals/Pain Pain Assessment: No/denies pain     Hand Dominance Left   Extremity/Trunk Assessment Upper Extremity Assessment Upper Extremity Assessment: Overall WFL for tasks assessed   Lower Extremity Assessment Lower Extremity Assessment: Defer to PT evaluation   Cervical / Trunk Assessment Cervical / Trunk Assessment: Normal   Communication Communication Communication: Expressive difficulties   Cognition Arousal/Alertness: Awake/alert Behavior During Therapy: WFL for tasks assessed/performed Overall Cognitive Status: Impaired/Different from baseline Area of Impairment: Attention;Following commands;Safety/judgement;Awareness;Problem solving                   Current Attention Level: Sustained   Following Commands: Follows one step commands consistently;Follows multi-step commands with increased time;Follows multi-step commands inconsistently Safety/Judgement: Decreased awareness of safety;Decreased awareness of deficits Awareness: Intellectual Problem Solving: Slow processing;Difficulty sequencing;Requires verbal cues General Comments: Pt requiring mod verbal cuing for all tasks, and increased time to process questions and situations.     General Comments  VSS on RA, BP 139/88    Exercises     Shoulder Instructions      Home Living Family/patient expects to be discharged to:: Private residence Living Arrangements:  Children Available Help at Discharge: Family Type of Home: House Home Access: Stairs to enter Technical brewer of Steps: 4 Entrance Stairs-Rails: Left Home Layout: Full bath on main level;Able to live on main level with bedroom/bathroom     Bathroom Shower/Tub: Tub/shower unit;Curtain   Bathroom Toilet: Handicapped height Bathroom Accessibility: Yes How Accessible: Accessible via walker Home Equipment: [place] - single point;Tub bench          Prior Functioning/Environment Prior Level of Function : Independent/Modified Independent             Mobility Comments: Using SPC at home ADLs Comments: States he performs tasks on his own at home        OT Problem List: Decreased strength;Decreased activity tolerance;Impaired balance (sitting and/or standing);Impaired vision/perception;Decreased coordination;Decreased cognition;Decreased safety awareness;Decreased knowledge of use of DME or AE      OT Treatment/Interventions: Self-care/ADL training;Therapeutic exercise;Energy conservation;DME and/or AE instruction;Therapeutic activities;Cognitive remediation/compensation;Visual/perceptual remediation/compensation;Balance training;Patient/family education    OT Goals(Current goals can be found in the care plan section) Acute Rehab OT Goals Patient Stated Goal: To get back to normal OT Goal Formulation: With patient Time For Goal Achievement: 08/25/21 Potential to Achieve Goals: Good ADL Goals Pt Will Perform Grooming: with modified independence;standing Pt Will Perform Lower Body Bathing: with modified independence;sitting/lateral leans;sit to/from stand Pt Will Perform Lower Body Dressing: with modified independence;sitting/lateral leans;sit to/from stand Pt Will Transfer to Toilet: with modified independence;ambulating Pt Will Perform Toileting - Clothing Manipulation and hygiene: with modified independence;sitting/lateral leans;sit to/from stand  OT Frequency: Min  2X/week   Barriers to D/C:            Co-evaluation              AM-PAC OT "6 Clicks" Daily Activity  Outcome Measure Help from another person eating meals?: A Lot Help from another person taking care of personal grooming?: A Little Help from another person toileting, which includes using toliet, bedpan, or urinal?: A Lot Help from another person bathing (including washing, rinsing, drying)?: A Lot Help from another person to put on and taking off regular upper body clothing?: A Little Help from another person to put on and taking off regular lower body clothing?: A Lot 6 Click Score: 14   End of Session Equipment Utilized During Treatment: Gait belt;Rolling walker (2 wheels) Nurse Communication: Mobility status  Activity Tolerance: Patient tolerated treatment well Patient left: in bed;with call bell/phone within reach;with bed alarm set;with family/visitor present  OT Visit Diagnosis: Unsteadiness on feet (R26.81);Other abnormalities of gait and mobility (R26.89);Muscle weakness (generalized) (M62.81)                Time: 9563-8756 OT Time Calculation (min): 54 min Charges:  OT General Charges $OT Visit: 1 Visit OT Evaluation $OT Eval Moderate Complexity: 1 Mod OT Treatments $Self Care/Home Management : 23-37 mins $Therapeutic Activity: 8-22 mins  Glen Barnett H., OTR/L Acute Rehabilitation  Glen Barnett Glen Barnett 08/11/2021, 5:26 PM

## 2021-08-11 NOTE — Evaluation (Signed)
Physical Therapy Evaluation Patient Details Name: Glen Barnett MRN: [id number] DOB: 02/20/1927 Today's Date: 08/11/2021  History of Present Illness  87 y.o. male with a past medical history of ambulatory dysfunction uses a cane to assist with ambulation at baseline, hypertension GERD, vitamin D deficiency presented to the emergency department after sustaining a fall at home. MRI showed right MCA territory infarcts.  Clinical Impression  Pt admitted with above diagnosis. Pt requires min assist for transfers and to ambulate safely with RW throughout room, difficulty navigating in congested spaces, needs cues for awareness of obstacles in environment. Lives with son who apparently works out of [employer]. Pt previously independent with SPC, goes out to eat with family, no longer driving after selling his car in January. Notable expressive speech impairment however contralateral focal strength seems to be minimally affected at this time. Per son pt appears more unsteady from his baseline. Feel he would progress exceptionally well and quickly with CIR. Pt currently with functional limitations due to the deficits listed below (see PT Problem List). Pt will benefit from skilled PT to increase their independence and safety with mobility to allow discharge to the venue listed below.          Recommendations for follow up therapy are one component of a multi-disciplinary discharge planning process, led by the attending physician.  Recommendations may be updated based on patient status, additional functional criteria and insurance authorization.  Follow Up Recommendations Acute inpatient rehab (3hours/day)    Assistance Recommended at Discharge Intermittent Supervision/Assistance (Physical assist with OOB mobility)  Functional Status Assessment Patient has had a recent decline in their functional status and demonstrates the ability to make significant improvements in function in a reasonable and  predictable amount of time.  Equipment Recommendations   (TBD next venue of care)    Recommendations for Other Services Rehab consult     Precautions / Restrictions Precautions Precautions: Fall Restrictions Weight Bearing Restrictions: No      Mobility  Bed Mobility Overal bed mobility: Needs Assistance Bed Mobility: Supine to Sit;Sit to Supine     Supine to sit: Min assist Sit to supine: Min guard   General bed mobility comments: Min assist to facilitate transfer to EOB from supine. Min guard for safety back into bed. Requires verbal cues for technique. requires extra time.    Transfers Overall transfer level: Needs assistance Equipment used: Rolling walker (2 wheels) Transfers: Sit to/from Stand Sit to Stand: Min assist           General transfer comment: Min assist for boost to stand from bed, cues for hand placement. Upon standing stable with UE support on RW. Knees slightly flexed, wide BOS.    Ambulation/Gait Ambulation/Gait assistance: Min assist Gait Distance (Feet): 22 Feet Assistive device: Rolling walker (2 wheels) Gait Pattern/deviations: Step-through pattern;Decreased stride length;Wide base of support Gait velocity: slow Gait velocity interpretation: <1.31 ft/sec, indicative of household ambulator   General Gait Details: Gait slow, knees slightly flexed throughout gait cycle with wide BOS. Pt requires VC for awareness of surroundings (open eyes and scan envionment.) Min assist to assist with RW during turns and in congested spaces. No overt buckling occurred however some instability present but able to self correct with support on RW.  Stairs            Wheelchair Mobility    Modified Rankin (Stroke Patients Only) Modified Rankin (Stroke Patients Only) Pre-Morbid Rankin Score: Moderate disability Modified Rankin: Moderately severe disability  Balance Overall balance assessment: Needs assistance Sitting-balance support: No upper  extremity supported;Feet supported Sitting balance-Leahy Scale: Good Sitting balance - Comments: slight Rightward lean but able to self correct with VC only. Postural control: Right lateral lean Standing balance support: No upper extremity supported Standing balance-Leahy Scale: Fair                               Pertinent Vitals/Pain Pain Assessment: No/denies pain    Home Living Family/patient expects to be discharged to:: Inpatient rehab Living Arrangements: Children Available Help at Discharge: Family Type of Home: House Home Access: Stairs to enter (4) Entrance Stairs-Rails: Left Entrance Stairs-Number of Steps: 2   Home Layout: Full bath on main level;Able to live on main level with bedroom/bathroom Home Equipment: Kasandra Knudsen - single point;Tub bench      Prior Function Prior Level of Function : Independent/Modified Independent             Mobility Comments: Using SPC at home ADLs Comments: States he performs tasks on his own at home     Hand Dominance   Dominant Hand: Left    Extremity/Trunk Assessment   Upper Extremity Assessment Upper Extremity Assessment: Defer to OT evaluation    Lower Extremity Assessment Lower Extremity Assessment: Generalized weakness       Communication   Communication: Expressive difficulties  Cognition Arousal/Alertness: Awake/alert Behavior During Therapy: WFL for tasks assessed/performed Overall Cognitive Status: No family/caregiver present to determine baseline cognitive functioning                                 General Comments: Oriented x3 (person, date, situation)        General Comments General comments (skin integrity, edema, etc.): HR 91, RR20, BP 144/104    Exercises     Assessment/Plan    PT Assessment Patient needs continued PT services  PT Problem List Decreased strength;Decreased activity tolerance;Decreased balance;Decreased mobility;Decreased coordination;Decreased knowledge  of use of DME       PT Treatment Interventions DME instruction;Gait training;Functional mobility training;Therapeutic activities;Therapeutic exercise;Balance training;Neuromuscular re-education;Patient/family education    PT Goals (Current goals can be found in the Care Plan section)  Acute Rehab PT Goals Patient Stated Goal: Get well PT Goal Formulation: With patient/family Time For Goal Achievement: 08/25/21 Potential to Achieve Goals: Good    Frequency Min 4X/week   Barriers to discharge Decreased caregiver support lives with son and son works out of [employer] "[employer]" Mobility  Outcome Measure Help needed turning from your back to your side while in a flat bed without using bedrails?: A Little Help needed moving from lying on your back to sitting on the side of a flat bed without using bedrails?: A Little Help needed moving to and from a bed to a chair (including a wheelchair)?: A Little Help needed standing up from a chair using your arms (e.g., wheelchair or bedside chair)?: A Little Help needed to walk in hospital room?: A Little Help needed climbing 3-5 steps with a railing? : A Lot 6 Click Score: 17    End of Session Equipment Utilized During Treatment: Gait belt Activity Tolerance: Patient tolerated treatment well Patient left: in bed;with call bell/phone within reach;with bed alarm set;with family/visitor present (HOB elevaated >30 deg) Nurse  Communication: Mobility status PT Visit Diagnosis: Other abnormalities of gait and mobility (R26.89);Unsteadiness on feet (R26.81);History of falling (Z91.81);Difficulty in walking, not elsewhere classified (R26.2);Other symptoms and signs involving the nervous system (R29.898)    Time: 2103-1281 PT Time Calculation (min) (ACUTE ONLY): 30 min   Charges:   PT Evaluation $PT Eval Low Complexity: 1 Low PT Treatments $Therapeutic Activity: 8-22 mins        Elayne Snare, PT, DPT  Ellouise Newer 08/11/2021, 11:13 AM

## 2021-08-11 NOTE — Progress Notes (Signed)
[facility] PROGRESS NOTE   Glen Barnett KZS:[id number] DOB: 07-15-1927 DOA: 08/10/2021  PCP: Tammi Sou, MD  Brief History/Interval Summary: 87 y.o. male with a past medical history of ambulatory dysfunction uses a cane to assist with ambulation at baseline, hypertension GERD, vitamin D deficiency presented to the emergency department after sustaining a fall at home.  He was found to have slurred speech and aphasia by his family.  There was left-sided facial droop.  Patient was brought into the hospital.  Concern was for acute stroke.  He was hospitalized for further management.   Reason for Visit: Acute stroke  Consultants: Neurology  Procedures: Transthoracic echocardiogram is pending    Subjective/Interval History: Patient mildly distracted this morning.  Denies any chest pain shortness of breath nausea or vomiting.  Continues to have difficulty speaking.  Denies any headache or visual disturbances at this morning.     Assessment/Plan:  Left-sided facial droop with aphasia Most likely secondary to acute stroke.  CT head did suggest a stroke in the right posterior frontal lobe.  MRI showed right MCA territory infarcts.  PT OT speech therapy.  Echocardiogram is pending.  Neurology has been consulted. LDL is 159.  HbA1c 5.8.  TSH 3.2. Patient noted to be on aspirin and Plavix.  Management per neurology.  Essential hypertension Allow permissive hypertension.  He is noted to be on Diovan Catapres verapamil and hydrochlorothiazide at home.  Would like to put him back on clonidine as soon as possible to avoid significant rebound hypertension.  Mild rhabdomyolysis CK level noted to be 1085 yesterday.  Improved to 940 today.  Continue gentle IV hydration.  Monitor urine output.  Hypokalemia This was repleted.  Recheck labs tomorrow.  Check magnesium level.  History of macular degeneration Stable.  History of vitamin D deficiency Continue home  medications.  History of GERD Continue PPI.  DVT Prophylaxis: Lovenox Code Status: Full code Family Communication: Discussed with patient.  No family at bedside Disposition Plan: Waiting on PT and OT evaluation  Status is: Observation  The patient will require care spanning > 2 midnights and should be moved to inpatient because: Needs to stay in the hospital for stroke work-up and management.     Medications: Scheduled:  aspirin EC  81 mg Oral Daily   cholecalciferol  2,000 Units Oral Daily   clopidogrel  75 mg Oral Daily   enoxaparin (LOVENOX) injection  40 mg Subcutaneous Q24H   multivitamin  1 tablet Oral Daily   pantoprazole  80 mg Oral Daily   Continuous:  lactated ringers 75 mL/hr at 08/10/21 2302   DDU:[id number] **OR** acetaminophen (TYLENOL) oral liquid 160 mg/5 mL **OR** acetaminophen, senna-docusate  Antibiotics: Anti-infectives (From admission, onward)    None       Objective:  Vital Signs  Vitals:   08/10/21 2330 08/11/21 0117 08/11/21 0317 08/11/21 0517  BP:  (!) 163/94 137/84 (!) 152/91  Pulse: 84 92 94 94  Resp: 18 17 18 13   Temp:  97.9 F (36.6 C) 98.4 F (36.9 C) 99.1 F (37.3 C)  TempSrc:  Oral Oral Oral  SpO2: 93% 94% 94% 95%    Intake/Output Summary (Last 24 hours) at 08/11/2021 0920 Last data filed at 08/11/2021 0300 Gross per 24 hour  Intake 493.55 ml  Output --  Net 493.55 ml   There were no vitals filed for this visit.  General appearance: Awake alert.  In no distress.  Mildly distracted Resp: Clear to auscultation bilaterally.  Normal effort Cardio: S1-S2 is normal regular.  No S3-S4.  No rubs murmurs or bruit GI: Abdomen is soft.  Nontender nondistended.  Bowel sounds are present normal.  No masses organomegaly Extremities: No edema.  Able to move all of his extremities Neurologic: Oriented to place.  Remains distracted.  Left-sided facial asymmetry is noted.  No obvious motor strength weakness is present.   Lab  Results:  Data Reviewed: I have personally reviewed following labs and imaging studies  CBC: Recent Labs  Lab 08/10/21 1556 08/10/21 1621  WBC 12.6*  --   NEUTROABS 10.3*  --   HGB 14.3 15.0  HCT 41.0 44.0  MCV 95.6  --   PLT 356  --     Basic Metabolic Panel: Recent Labs  Lab 08/10/21 1556 08/10/21 1621  NA 134* 135  K 3.3* 3.3*  CL 98 98  CO2 25  --   GLUCOSE 120* 120*  BUN 18 24*  CREATININE 0.94 0.90  CALCIUM 9.0  --     GFR: CrCl cannot be calculated (Unknown ideal weight.).  Liver Function Tests: Recent Labs  Lab 08/10/21 1556  AST 58*  ALT 26  ALKPHOS 97  BILITOT 0.7  PROT 6.6  ALBUMIN 3.5      Coagulation Profile: Recent Labs  Lab 08/10/21 1556  INR 1.0    Cardiac Enzymes: Recent Labs  Lab 08/10/21 2000 08/11/21 0339  CKTOTAL 1,185* 940*     HbA1C: Recent Labs    08/11/21 0339  HGBA1C 5.8*      Lipid Profile: Recent Labs    08/11/21 0339  CHOL 243*  HDL 68  LDLCALC 159*  TRIG 81  CHOLHDL 3.6    Thyroid Function Tests: Recent Labs    08/11/21 0339  TSH 3.260     Recent Results (from the past 240 hour(s))  Resp Panel by RT-PCR (Flu A&B, Covid) Nasopharyngeal Swab     Status: None   Collection Time: 08/10/21  3:56 PM   Specimen: Nasopharyngeal Swab; Nasopharyngeal(NP) swabs in vial transport medium  Result Value Ref Range Status   SARS Coronavirus 2 by RT PCR NEGATIVE NEGATIVE Final    Comment: (NOTE) SARS-CoV-2 target nucleic acids are NOT DETECTED.  The SARS-CoV-2 RNA is generally detectable in upper respiratory specimens during the acute phase of infection. The lowest concentration of SARS-CoV-2 viral copies this assay can detect is 138 copies/mL. A negative result does not preclude SARS-Cov-2 infection and should not be used as the sole basis for treatment or other patient management decisions. A negative result may occur with  improper specimen collection/handling, submission of specimen other than  nasopharyngeal swab, presence of viral mutation(s) within the areas targeted by this assay, and inadequate number of viral copies(<138 copies/mL). A negative result must be combined with clinical observations, patient history, and epidemiological information. The expected result is Negative.  Fact Sheet for Patients:  EntrepreneurPulse.com.au  Fact Sheet for Healthcare Providers:  IncredibleEmployment.be  This test is no t yet approved or cleared by the [place] FDA and  has been authorized for detection and/or diagnosis of SARS-CoV-2 by FDA under an Emergency Use Authorization (EUA). This EUA will remain  in effect (meaning this test can be used) for the duration of the COVID-19 declaration under Section 564(b)(1) of the Act, 21 U.S.C.section 360bbb-3(b)(1), unless the authorization is terminated  or revoked sooner.       Influenza A by PCR NEGATIVE NEGATIVE Final   Influenza B by PCR NEGATIVE NEGATIVE Final  Comment: (NOTE) The Xpert Xpress SARS-CoV-2/FLU/RSV plus assay is intended as an aid in the diagnosis of influenza from Nasopharyngeal swab specimens and should not be used as a sole basis for treatment. Nasal washings and aspirates are unacceptable for Xpert Xpress SARS-CoV-2/FLU/RSV testing.  Fact Sheet for Patients: EntrepreneurPulse.com.au  Fact Sheet for Healthcare Providers: IncredibleEmployment.be  This test is not yet approved or cleared by the [place] FDA and has been authorized for detection and/or diagnosis of SARS-CoV-2 by FDA under an Emergency Use Authorization (EUA). This EUA will remain in effect (meaning this test can be used) for the duration of the COVID-19 declaration under Section 564(b)(1) of the Act, 21 U.S.C. section 360bbb-3(b)(1), unless the authorization is terminated or revoked.  Performed at [facility], [place] [address], [place], [place] [address]        Radiology Studies: CT ANGIO HEAD NECK W WO CM  Result Date: 08/10/2021 CLINICAL DATA:  Stroke suspected, neck pain, trauma EXAM: CT ANGIOGRAPHY HEAD AND NECK CT CERVICAL SPINE WITHOUT CONTRAST TECHNIQUE: Multidetector CT imaging of the head and neck was performed using the standard protocol during bolus administration of intravenous contrast. Multiplanar CT image reconstructions and MIPs were obtained to evaluate the vascular anatomy. Carotid stenosis measurements (when applicable) are obtained utilizing NASCET criteria, using the distal internal carotid diameter as the denominator. Multidetector CT imaging of the cervical spine was performed without intravenous contrast. Multiplanar CT image reconstructions were also generated. CONTRAST:  65mL OMNIPAQUE IOHEXOL 350 MG/ML SOLN COMPARISON:  No prior CT of the head or neck, correlation is made with MRI head 03/12/2014. FINDINGS: CT HEAD FINDINGS Brain: Area of low density in the lateral right posterior frontal lobe (series 5, image 24), which is new compared to the prior exam and may represent acute or subacute infarct. No acute hemorrhage, mass, mass effect, or midline shift. Advanced cerebral volume loss, not significantly changed compared to 2015. No extra-axial collection. Unchanged size and configuration of the ventricles. Vascular: No hyperdense vessel. Skull: No acute osseous abnormality. Sinuses: Imaged portions are clear. Orbits: Status post bilateral lens replacements. Review of the MIP images confirms the above findings CT NECK FINDINGS Alignment: Trace anterolisthesis C7 on T1, which appears degenerative. Skull base and vertebrae: No acute fracture. No primary bone lesion or focal pathologic process. Soft tissues and spinal canal: No prevertebral fluid or swelling. No visible canal hematoma. Disc levels: Severe disc height loss C3-C7. moderate spinal canal stenosis at C6-C7. Multilevel facet and uncovertebral hypertrophy, with severe neural  foraminal narrowing on the right at C3-C4 and C5-C6. Other: None. CTA NECK FINDINGS Aortic arch: Standard branching. Imaged portion shows no evidence of aneurysm or dissection. No significant stenosis of the major arch vessel origins. Right carotid system: No evidence of dissection, stenosis (50% or greater) or occlusion. Retropharyngeal course. Left carotid system: No evidence of dissection, stenosis (50% or greater) or occlusion. Vertebral arteries: Codominant. No evidence of dissection, stenosis (50% or greater) or occlusion. Other neck: Negative Upper chest: No focal pulmonary opacity or pleural effusion. Review of the MIP images confirms the above findings CTA HEAD FINDINGS Anterior circulation: Both internal carotid arteries are patent to the termini, with mild irregularity but without stenosis or other abnormality. A1 segments patent. Normal anterior communicating artery. Anterior cerebral arteries are patent to their distal aspects. No M1 stenosis or occlusion. Normal MCA bifurcations. Distal MCA branches are slightly asymmetric but appear perfused. Posterior circulation: Vertebral arteries widely patent to the vertebrobasilar junction without stenosis. The right posteroinferior  cerebellar artery is not visualized. Basilar patent to its distal aspect. Superior cerebral arteries patent bilaterally. Focal narrowing of the left P1-P2 junction (series 11, images 105-108). PCAs are otherwise perfused to their distal aspects without stenosis. The posterior communicating arteries are not visualized. Venous sinuses: As permitted by contrast timing, patent. Anatomic variants: None significant Review of the MIP images confirms the above findings IMPRESSION: 1. Area of low density in the lateral right posterior frontal lobe, which is new compared to 2015 but is of indeterminate acuity. If there is concern for infarct in this territory, an MRI is recommended. 2. No acute fracture or traumatic subluxation in the cervical  spine. 3. Focal narrowing of the left P1-P2 junction. Otherwise no significant intracranial stenosis. No large vessel occlusion. 4. No hemodynamically significant stenosis in the neck. These results were called by telephone at the time of interpretation on 08/10/2021 at 6:40 pm to provider [facility] , who verbally acknowledged these results. Electronically Signed   By: Merilyn Baba M.D.   On: 08/10/2021 18:40   DG Chest 1 View  Result Date: 08/10/2021 CLINICAL DATA:  Cough. EXAM: CHEST  1 VIEW COMPARISON:  Chest x-ray 07/13/2019. FINDINGS: There is a large hiatal hernia similar to the prior study. Cardiomediastinal silhouette is stable and within normal limits. There are atherosclerotic calcifications of the aorta. The lungs and costophrenic angles are clear. There is no pneumothorax. No acute fractures are seen. IMPRESSION: 1. Stable large hiatal hernia. 2. No acute cardiopulmonary process. Electronically Signed   By: Ronney Asters M.D.   On: 08/10/2021 21:26   MR BRAIN WO CONTRAST  Result Date: 08/11/2021 CLINICAL DATA:  Neuro deficit with acute stroke suspected. EXAM: MRI HEAD WITHOUT CONTRAST TECHNIQUE: Multiplanar, multiecho pulse sequences of the brain and surrounding structures were obtained without intravenous contrast. COMPARISON:  Head CT from yesterday FINDINGS: Brain: Acute cortically based infarcts in the right posterior frontal and separately in the right parietal lobes, both MCA territory. No hemorrhage, hydrocephalus, or masslike finding. Generalized brain atrophy with variable sulcal widening of indeterminate cause. Chronic small vessel ischemia in the hemispheric white matter and pons, mild for age. Vascular: Major flow voids are preserved Skull and upper cervical spine: Negative Sinuses/Orbits: Bilateral cataract resection and staphyloma. IMPRESSION: 1. Acute right MCA branch infarcts affecting frontal and parietal cortex. 2. Significant brain atrophy. Electronically Signed   By:  Jorje Guild M.D.   On: 08/11/2021 08:19   CT C-SPINE NO CHARGE  Result Date: 08/10/2021 CLINICAL DATA:  Stroke suspected, neck pain, trauma EXAM: CT ANGIOGRAPHY HEAD AND NECK CT CERVICAL SPINE WITHOUT CONTRAST TECHNIQUE: Multidetector CT imaging of the head and neck was performed using the standard protocol during bolus administration of intravenous contrast. Multiplanar CT image reconstructions and MIPs were obtained to evaluate the vascular anatomy. Carotid stenosis measurements (when applicable) are obtained utilizing NASCET criteria, using the distal internal carotid diameter as the denominator. Multidetector CT imaging of the cervical spine was performed without intravenous contrast. Multiplanar CT image reconstructions were also generated. CONTRAST:  64mL OMNIPAQUE IOHEXOL 350 MG/ML SOLN COMPARISON:  No prior CT of the head or neck, correlation is made with MRI head 03/12/2014. FINDINGS: CT HEAD FINDINGS Brain: Area of low density in the lateral right posterior frontal lobe (series 5, image 24), which is new compared to the prior exam and may represent acute or subacute infarct. No acute hemorrhage, mass, mass effect, or midline shift. Advanced cerebral volume loss, not significantly changed compared to 2015. No extra-axial collection.  Unchanged size and configuration of the ventricles. Vascular: No hyperdense vessel. Skull: No acute osseous abnormality. Sinuses: Imaged portions are clear. Orbits: Status post bilateral lens replacements. Review of the MIP images confirms the above findings CT NECK FINDINGS Alignment: Trace anterolisthesis C7 on T1, which appears degenerative. Skull base and vertebrae: No acute fracture. No primary bone lesion or focal pathologic process. Soft tissues and spinal canal: No prevertebral fluid or swelling. No visible canal hematoma. Disc levels: Severe disc height loss C3-C7. moderate spinal canal stenosis at C6-C7. Multilevel facet and uncovertebral hypertrophy, with  severe neural foraminal narrowing on the right at C3-C4 and C5-C6. Other: None. CTA NECK FINDINGS Aortic arch: Standard branching. Imaged portion shows no evidence of aneurysm or dissection. No significant stenosis of the major arch vessel origins. Right carotid system: No evidence of dissection, stenosis (50% or greater) or occlusion. Retropharyngeal course. Left carotid system: No evidence of dissection, stenosis (50% or greater) or occlusion. Vertebral arteries: Codominant. No evidence of dissection, stenosis (50% or greater) or occlusion. Other neck: Negative Upper chest: No focal pulmonary opacity or pleural effusion. Review of the MIP images confirms the above findings CTA HEAD FINDINGS Anterior circulation: Both internal carotid arteries are patent to the termini, with mild irregularity but without stenosis or other abnormality. A1 segments patent. Normal anterior communicating artery. Anterior cerebral arteries are patent to their distal aspects. No M1 stenosis or occlusion. Normal MCA bifurcations. Distal MCA branches are slightly asymmetric but appear perfused. Posterior circulation: Vertebral arteries widely patent to the vertebrobasilar junction without stenosis. The right posteroinferior cerebellar artery is not visualized. Basilar patent to its distal aspect. Superior cerebral arteries patent bilaterally. Focal narrowing of the left P1-P2 junction (series 11, images 105-108). PCAs are otherwise perfused to their distal aspects without stenosis. The posterior communicating arteries are not visualized. Venous sinuses: As permitted by contrast timing, patent. Anatomic variants: None significant Review of the MIP images confirms the above findings IMPRESSION: 1. Area of low density in the lateral right posterior frontal lobe, which is new compared to 2015 but is of indeterminate acuity. If there is concern for infarct in this territory, an MRI is recommended. 2. No acute fracture or traumatic subluxation  in the cervical spine. 3. Focal narrowing of the left P1-P2 junction. Otherwise no significant intracranial stenosis. No large vessel occlusion. 4. No hemodynamically significant stenosis in the neck. These results were called by telephone at the time of interpretation on 08/10/2021 at 6:40 pm to provider [facility] , who verbally acknowledged these results. Electronically Signed   By: Merilyn Baba M.D.   On: 08/10/2021 18:40       LOS: 0 days   [facility] Pager on [email or web address]  08/11/2021, 9:20 AM

## 2021-08-11 NOTE — Progress Notes (Signed)
Inpatient Rehab Admissions Coordinator:  ? ?Per therapy recommendations,  patient was screened for CIR candidacy by Edan Serratore, MS, CCC-SLP. At this time, Pt. Appears to be a a potential candidate for CIR. I will place   order for rehab consult per protocol for full assessment. Please contact me any with questions. ? ?Chrystine Frogge, MS, CCC-SLP ?Rehab Admissions Coordinator  ?[phone] (celll) ?[phone] (office) ? ?

## 2021-08-11 NOTE — Procedures (Signed)
Routine EEG Report  Glen Barnett is a 87 y.o. male with a history of stroke who is undergoing an EEG to evaluate for seizures.  Report: This EEG was acquired with electrodes placed according to the International 10-20 electrode system (including Fp1, Fp2, F3, F4, C3, C4, P3, P4, O1, O2, T3, T4, T5, T6, A1, A2, Fz, Cz, Pz). The following electrodes were missing or displaced: none.  The occipital dominant rhythm was 8.5 Hz. This activity is reactive to stimulation. Drowsiness was manifested by background fragmentation; deeper stages of sleep were identified by K complexes and sleep spindles. There was no focal slowing. There were no interictal epileptiform discharges. There were no electrographic seizures identified. Photic stimulation and hyperventilation were not performed.  Impression: This EEG was obtained while awake and asleep and is normal.    Clinical Correlation: Normal EEGs, however, do not rule out epilepsy.  Su Monks, MD [facility] [phone]  If 7pm- 7am, please page neurology on call as listed in [place].

## 2021-08-12 ENCOUNTER — Encounter (HOSPITAL_COMMUNITY): Payer: Medicare Other

## 2021-08-12 DIAGNOSIS — E876 Hypokalemia: Secondary | ICD-10-CM | POA: Diagnosis not present

## 2021-08-12 DIAGNOSIS — E7800 Pure hypercholesterolemia, unspecified: Secondary | ICD-10-CM

## 2021-08-12 DIAGNOSIS — D72829 Elevated white blood cell count, unspecified: Secondary | ICD-10-CM | POA: Diagnosis not present

## 2021-08-12 DIAGNOSIS — I10 Essential (primary) hypertension: Secondary | ICD-10-CM | POA: Diagnosis not present

## 2021-08-12 DIAGNOSIS — I639 Cerebral infarction, unspecified: Secondary | ICD-10-CM | POA: Diagnosis not present

## 2021-08-12 LAB — COMPREHENSIVE METABOLIC PANEL
ALT: 25 U/L (ref 0–44)
AST: 38 U/L (ref 15–41)
Albumin: 3 g/dL — ABNORMAL LOW (ref 3.5–5.0)
Alkaline Phosphatase: 88 U/L (ref 38–126)
Anion gap: 11 (ref 5–15)
BUN: 22 mg/dL (ref 8–23)
CO2: 24 mmol/L (ref 22–32)
Calcium: 8.4 mg/dL — ABNORMAL LOW (ref 8.9–10.3)
Chloride: 98 mmol/L (ref 98–111)
Creatinine, Ser: 0.91 mg/dL (ref 0.61–1.24)
GFR, Estimated: >60 mL/min (ref >60)
Glucose, Bld: 128 mg/dL — ABNORMAL HIGH (ref 70–99)
Potassium: 2.9 mmol/L — ABNORMAL LOW (ref 3.5–5.1)
Sodium: 133 mmol/L — ABNORMAL LOW (ref 135–145)
Total Bilirubin: 1 mg/dL (ref 0.3–1.2)
Total Protein: 6.2 g/dL — ABNORMAL LOW (ref 6.5–8.1)

## 2021-08-12 LAB — CBC
HCT: 42.9 % (ref 39.0–52.0)
Hemoglobin: 15.2 g/dL (ref 13.0–17.0)
MCH: 33.6 pg (ref 26.0–34.0)
MCHC: 35.4 g/dL (ref 30.0–36.0)
MCV: 94.7 fL (ref 80.0–100.0)
Platelets: 319 10*3/uL (ref 150–400)
RBC: 4.53 MIL/uL (ref 4.22–5.81)
RDW: 14.2 % (ref 11.5–15.5)
WBC: 17.9 10*3/uL — ABNORMAL HIGH (ref 4.0–10.5)
nRBC: 0 % (ref 0.0–0.2)

## 2021-08-12 LAB — MAGNESIUM: Magnesium: 2 mg/dL (ref 1.7–2.4)

## 2021-08-12 MED ORDER — POTASSIUM CHLORIDE 20 MEQ PO PACK
40.0000 meq | PACK | Freq: Once | ORAL | Status: AC
  Administered 2021-08-12: 40 meq via ORAL
  Filled 2021-08-12: qty 2

## 2021-08-12 MED ORDER — ATORVASTATIN CALCIUM 40 MG PO TABS
40.0000 mg | ORAL_TABLET | Freq: Every day | ORAL | Status: DC
  Administered 2021-08-12 – 2021-08-16 (×5): 40 mg via ORAL
  Filled 2021-08-12 (×5): qty 1

## 2021-08-12 MED ORDER — POTASSIUM CHLORIDE 10 MEQ/100ML IV SOLN
10.0000 meq | INTRAVENOUS | Status: AC
  Administered 2021-08-12 (×2): 10 meq via INTRAVENOUS
  Filled 2021-08-12 (×2): qty 100

## 2021-08-12 MED ORDER — GUAIFENESIN 100 MG/5ML PO LIQD
5.0000 mL | ORAL | Status: DC | PRN
  Administered 2021-08-12 (×2): 5 mL via ORAL
  Filled 2021-08-12 (×2): qty 5

## 2021-08-12 MED ORDER — GUAIFENESIN ER 600 MG PO TB12
600.0000 mg | ORAL_TABLET | Freq: Two times a day (BID) | ORAL | Status: DC
  Administered 2021-08-12 – 2021-08-16 (×8): 600 mg via ORAL
  Filled 2021-08-12 (×8): qty 1

## 2021-08-12 NOTE — Progress Notes (Addendum)
STROKE TEAM PROGRESS NOTE   ATTENDING NOTE: I reviewed above note and agree with the assessment and plan. Pt was seen and examined.   87 year old male with history of hypertension, hyperlipidemia, CKD, anemia admitted for left facial weakness, slurred speech, and found down on the floor.  CT showed brain atrophy and right frontal parietal small cortical infarct.  MRI showed right MCA scattered infarcts.  CTA head and neck left P1/P2 high-grade stenosis.  EF 50 to 55%, UDS negative, A1c 5.8, LDL 159.  LE venous Doppler pending.  On exam, son and daughter in law are at the beside. Pt awake, alert, eyes open, orientated to age, place, time and people. No aphasia, fluent language, but moderate dysarthria, following all simple commands. Able to name and repeat. No gaze palsy but seems to have right gaze preference, tracking bilaterally, visual field full. Mild left facial droop. Tongue midline. Bilateral UEs 5/5, no drift. Bilaterally LEs 5/5, no drift. Sensation symmetrical bilaterally, b/l FTN intact grossly, gait not tested.   Patient stroke likely embolic pattern, source unclear.  However, concerning for occult A. fib.  Recommend 30-day cardiac event monitoring as outpatient to rule out A. fib.  May consider loop recorder if 30-day monitoring negative.  Continue aspirin 81 and plan 75 DAPT for 3 weeks and then aspirin alone.  Continue Lipitor 40.  PT/OT recommend CIR.  For detailed assessment and plan, please refer to above as I have made changes wherever appropriate.   Neurology will sign off. Please call with questions. Pt will follow up with stroke clinic NP at [facility] in about 4 weeks. Thanks for the consult.   Rosalin Hawking, MD PhD Stroke Neurology 08/12/2021 5:11 PM    INTERVAL HISTORY Patient was seen in his room with his son and daughter in law at the bedside.  He was admitted on 11/11 after falling from his recliner and being found with left sided facial droop and slurred speech.  MRI  demonstrates acute right MCA infarcts in right frontal and parietal cortex as well as significant atrophy.  Vitals:   08/11/21 2351 08/12/21 0000 08/12/21 0416 08/12/21 0931  BP: (!) 155/82  123/79 123/75  Pulse: (!) 109  96 92  Resp: (!) 23 (!) 22 19 18   Temp: 98.1 F (36.7 C)  98.7 F (37.1 C)   TempSrc: Oral  Oral   SpO2: 91%  95% 96%   CBC:  Recent Labs  Lab 08/10/21 1556 08/10/21 1621 08/12/21 0222  WBC 12.6*  --  17.9*  NEUTROABS 10.3*  --   --   HGB 14.3 15.0 15.2  HCT 41.0 44.0 42.9  MCV 95.6  --  94.7  PLT 356  --  544   Basic Metabolic Panel:  Recent Labs  Lab 08/10/21 1556 08/10/21 1621 08/12/21 0222  NA 134* 135 133*  K 3.3* 3.3* 2.9*  CL 98 98 98  CO2 25  --  24  GLUCOSE 120* 120* 128*  BUN 18 24* 22  CREATININE 0.94 0.90 0.91  CALCIUM 9.0  --  8.4*  MG  --   --  2.0   Lipid Panel:  Recent Labs  Lab 08/11/21 0339  CHOL 243*  TRIG 81  HDL 68  CHOLHDL 3.6  VLDL 16  LDLCALC 159*   HgbA1c:  Recent Labs  Lab 08/11/21 0339  HGBA1C 5.8*   Urine Drug Screen:  Recent Labs  Lab 08/10/21 2033  LABOPIA NONE DETECTED  COCAINSCRNUR NONE DETECTED  LABBENZ NONE DETECTED  AMPHETMU NONE DETECTED  THCU NONE DETECTED  LABBARB NONE DETECTED    Alcohol Level  Recent Labs  Lab 08/10/21 1556  ETH <10    IMAGING past 24 hours EEG adult  Result Date: 08/11/2021 Derek Jack, MD     08/11/2021  8:36 PM Routine EEG Report Coleby Yett is a 87 y.o. male with a history of stroke who is undergoing an EEG to evaluate for seizures. Report: This EEG was acquired with electrodes placed according to the International 10-20 electrode system (including Fp1, Fp2, F3, F4, C3, C4, P3, P4, O1, O2, T3, T4, T5, T6, A1, A2, Fz, Cz, Pz). The following electrodes were missing or displaced: none. The occipital dominant rhythm was 8.5 Hz. This activity is reactive to stimulation. Drowsiness was manifested by background fragmentation; deeper stages of sleep  were identified by K complexes and sleep spindles. There was no focal slowing. There were no interictal epileptiform discharges. There were no electrographic seizures identified. Photic stimulation and hyperventilation were not performed. Impression: This EEG was obtained while awake and asleep and is normal.   Clinical Correlation: Normal EEGs, however, do not rule out epilepsy. Su Monks, MD [facility] [phone] If 7pm- 7am, please page neurology on call as listed in [place].    PHYSICAL EXAM General:  Patient is an alert, well-developed, well-nourished male in no acute distress   NEURO:  Mental Status: AA&Ox3, short term memory deficit noted Speech/Language: slurring of speech noted.  Naming, repetition, fluency, and comprehension intact.  Cranial Nerves:  II: PERRL. Visual fields full.  III, IV, VI: EOMI. Eyelids elevate symmetrically.  V: Sensation is intact to light touch and symmetrical to face.  VII: Left sided facial droop. Able to puff cheeks and raise eyebrows.  VIII: hearing intact to voice. IX, X: Phonation is normal.  XII: tongue is midline without fasciculations. Motor: 5/5 strength to all muscle groups tested.  Tone: is normal and bulk is normal Sensation- Intact to light touch bilaterally.  Coordination: FTN shows bilateral dysmetria, HKS: mild ataxia in BLE.No drift.  Gait- deferred   ASSESSMENT/PLAN Mr. Yamin Swingler is a 87 y.o. male with history of remote stroke, HTN, HLD, GERD, renal insufficiency, chronic GIB with iron deficiency anemia, venous insufficiency and macular degeneration presenting with left sided facial droop and slurred speech after a fall from his recliner.  He presented outside the window for TNK, and no large vessel occlusion was found.    Stroke:   lateral right posterior frontal lobe infarct, possibly embolic, source unclear, concerning for occult afib CTA head & neck area of low density in lateral right posterior  frontal lobe, focal narrowing of left p1-p2 junction and no large vessel occlusion MRI  Acute right MCA branch infarcts affecting frontal and parietal cortex, significant atrophy 2D Echo no LV mural thrombus, EF 50-55%, calcification of aortic valve, no atrial level shunt Recommend 30-day cardiac event monitoring as outpatient to rule out A. fib.  If negative, may consider loop recorder. LE venous doppler pending LDL 159 HgbA1c 5.8 VTE prophylaxis - lovenox No antithrombotic prior to admission, now on aspirin 81 mg daily and clopidogrel 75 mg daily DAPT for 3 weeks and then ASA alone. Therapy recommendations:  CIR Disposition:  pending  Hypertension Home meds:  clonidine 0.1 mg BID, HCTZ 50 mg daily, valsartan 320 mg daily, verapamil CR 240 mg daily Stable Gradually normalize BP in 2-3 days Long-term BP goal normotensive  Hyperlipidemia Home meds:  none LDL 159,  goal < 70 Add atorvastatin 40 mg daily  High intensity statin deferred due to advanced age Continue statin at discharge  Concern for seizure Patient was found down at home after a fall EEG reveals no seizure activity  Other Stroke Risk Factors Hx stroke/TIA   Other Active Problems Macular degeneration Chronic GIB with anemia Monitor CBC as needed Renal insufficiency CR 0.91  Hospital day # [place], NP   To contact Stroke Continuity provider, please refer to [email or web address] After hours, contact General Neurology

## 2021-08-12 NOTE — Progress Notes (Signed)
Inpatient Rehab Admissions Coordinator:   I spoke with Pt.'s son regarding potential CIR admit. He stated interest and that he can provide 24/7 support at discharge. I will pursue for potential admit pending medical readiness and bed availability.   Clemens Catholic, [place], [place] Admissions Coordinator  [phone] ([place]) [phone] (office)

## 2021-08-12 NOTE — PMR Pre-admission (Shared)
PMR Admission Coordinator Pre-Admission Assessment  Patient: Glen Barnett is an 87 y.o., male MRN: [id number] DOB: 18-Mar-1927 Height:   Weight:    Insurance Information HMO: ***    PPO: ***     PCP: ***     IPA: ***     80/20: ***     OTHER: *** PRIMARY: Medicare A and B       Policy#: ***      Subscriber: *** CM Name: ***      Phone#: ***     Fax#: *** Pre-Cert#: ***      Employer: *** Benefits:  Phone #: ***     Name: *** Eff. Date: ***     Deduct: ***      Out of Pocket Max: ***      Life Max: *** CIR: ***      SNF: *** Outpatient: ***     Co-Pay: *** Home Health: ***      Co-Pay: *** DME: ***     Co-Pay: *** Providers: *** SECONDARY: AARP      Policy#: ***     Phone#: ***  Financial Counselor:       Phone#:   [employer] for patients in Inpatient Rehabilitation Facilities with attached Privacy Act Statement-Health Care Records was provided and verbally reviewed with: Patient  Emergency Contact Information Contact Information     Name Relation Home Work Mobile   [place] Daughter   [phone]   Roetta Sessions   [phone]   Richardson,John (Pt.'s caregiver) Shari Heritage   [phone]       Current Medical History  Patient Admitting Diagnosis: CVA History of Present Illness: 87 y.o. male with a past medical history of ambulatory dysfunction uses a cane to assist with ambulation at baseline, hypertension GERD, vitamin D deficiency presented to the emergency department after sustaining a fall at home 08/10/21.  He was found to have slurred speech and aphasia by his family.  There was left-sided facial droop.  head did suggest a stroke in the right posterior frontal lobe.  MRI showed right MCA territory infarcts.   Neurology was consulted.  Echocardiogram showed EF to be 50 to 55% with mild LVH.LDL is 159.  HbA1c 5.8.  TSH 3.2.  EEG does not show any seizure activity. Statin not initiated yet due to rhabdomyolysis.Pt  receiving gentle IV hydration for rhabdo. Renal function appears stable. Pt. Also with hypokalemia, given supplemetation and leukocytosis (source unclear). CIR was consulted to assist in return to PLOF.  Patient is on aspirin and Plavix. Complete NIHSS TOTAL: 3  Patient[facility] has been reviewed by the rehabilitation admission coordinator and physician.  Past Medical History  Past Medical History:  Diagnosis Date   Arthritis    Ataxia 2014/15   brain MRI 02/2014 showed encephalomalacia parietal and occip lobes c/w old infarcts, plus diffuse cerebral atrophy, o/w normal   Cataract    Chronic renal insufficiency, stage 2 (mild) 12/2016   GFR 60s-70s   DDD (degenerative disc disease)    L-spine   Dermatitis    ?Contact derm? per Noralyn Pick Derm MD: Dr. Samule Ohm (05/09/16): punch bx 06/18/16: chronic eczematous derm/contact derm, neg fungal stain.  Allergy (RAST) testing NEG.   GERD (gastroesophageal reflux disease)    Heme positive stool 02/2012   colonoscopy normal.  EGD showed small hiatal hernia with associated Cameron's erosions.  Hemoccults NEG x 3 09/2014. Hem + iron def 07/2019->conservative mgmt.  Hyperlipidemia    Hypertension    Insomnia    poor sleep hygiene.  Clonidine for his HTN has helped a little.   Iron deficiency anemia 02/2012; 07/2019   2013 Mild iron deficiency->small upper GI erosions, colonoscopy normal.  07/2019->Hb 7.5, heme+, GI eval->pt chose no endoscopy->IV iron and Hb monitoring, PO iron indefinitely: Hb/iron normal on recheck 10/2019 and 12/2019.   Macular degeneration, age related    exudative R eye; nonexudative L eye.  Vitreous degeneration bilat.   Obesity (BMI 30-39.9)    Retinal hemorrhage 2016   w/ retinal detachment (right eye) : anticoagulants stopped due to this   Venous insufficiency of both lower extremities     Has the patient had major surgery during 100 days prior to admission? Yes  Family History    family history includes Liver cancer in his mother.  Current Medications  Current Facility-Administered Medications:    acetaminophen (TYLENOL) tablet 650 mg, 650 mg, Oral, Q4H PRN **OR** acetaminophen (TYLENOL) 160 MG/5ML solution 650 mg, 650 mg, Per Tube, Q4H PRN **OR** acetaminophen (TYLENOL) suppository 650 mg, 650 mg, Rectal, Q4H PRN, Rowe Pavy, Shayan S, DO   aspirin EC tablet 81 mg, 81 mg, Oral, Daily, Anwar, Shayan S, DO   atorvastatin (LIPITOR) tablet 40 mg, 40 mg, Oral, Daily, Rosalin Hawking, MD, 40 mg at 08/12/21 0944   cholecalciferol (VITAMIN D3) tablet 2,000 Units, 2,000 Units, Oral, Daily, Imagene Sheller S, DO, 2,000 Units at 08/12/21 0944   clopidogrel (PLAVIX) tablet 75 mg, 75 mg, Oral, Daily, Anwar, Shayan S, DO, 75 mg at 08/12/21 0944   enoxaparin (LOVENOX) injection 40 mg, 40 mg, Subcutaneous, Q24H, Anwar, Shayan S, DO, 40 mg at 08/12/21 0944   guaiFENesin (MUCINEX) 12 hr tablet 600 mg, 600 mg, Oral, BID, Bonnielee Haff, MD   guaiFENesin (ROBITUSSIN) 100 MG/5ML liquid 5 mL, 5 mL, Oral, Q4H PRN, Bonnielee Haff, MD, 5 mL at 08/12/21 0954   multivitamin (PROSIGHT) tablet 1 tablet, 1 tablet, Oral, Daily, Reome, Earle J, RPH, 1 tablet at 08/12/21 0944   ondansetron (ZOFRAN) injection 4 mg, 4 mg, Intravenous, Q6H PRN, Kristopher Oppenheim, DO   pantoprazole (PROTONIX) EC tablet 80 mg, 80 mg, Oral, Daily, Anwar, Shayan S, DO   senna-docusate (Senokot-S) tablet 1 tablet, 1 tablet, Oral, QHS PRN, Imagene Sheller S, DO  Patients Current Diet:  Diet Order             DIET - DYS 1 Room service appropriate? Yes with Assist; Fluid consistency: Nectar Thick  Diet effective now                   Precautions / Restrictions Precautions Precautions: Fall Restrictions Weight Bearing Restrictions: No   Has the patient had 2 or more falls or a fall with injury in the past year? No  Prior Activity Level Community (5-7x/wk): Pt went out regularly  Prior Functional Level Self Care: Did the  patient need help bathing, dressing, using the toilet or eating? Independent  Indoor Mobility: Did the patient need assistance with walking from room to room (with or without device)? Independent  Stairs: Did the patient need assistance with internal or external stairs (with or without device)? Needed some help  Functional Cognition: Did the patient need help planning regular tasks such as shopping or remembering to take medications? Needed some help  Patient Information Are you of Hispanic, Latino/a,or Spanish origin?: A. No, not of Hispanic, Latino/a, or Spanish origin What is your race?: A. White Do you need  or want an interpreter to communicate with a doctor or health care staff?: 0. No  Patient's Response To:  Health Literacy and Transportation Is the patient able to respond to health literacy and transportation needs?: Yes Health Literacy - How often do you need to have someone help you when you read instructions, pamphlets, or other written material from your doctor or pharmacy?: Never In the past 12 months, has lack of transportation kept you from medical appointments or from getting medications?: No In the past 12 months, has lack of transportation kept you from meetings, work, or from getting things needed for daily living?: No  Home Assistive Devices / [place]: Cane - single point, Tub bench  Prior Device Use: Indicate devices/aids used by the patient prior to current illness, exacerbation or injury? None of the above  Current Functional Level Cognition  Arousal/Alertness: Awake/alert Overall Cognitive Status: Impaired/Different from baseline Current Attention Level: Sustained Orientation Level: Oriented to person, Oriented to place, Oriented to time Following Commands: Follows one step commands consistently, Follows multi-step commands with increased time, Follows multi-step commands inconsistently Safety/Judgement: Decreased awareness of safety, Decreased  awareness of deficits General Comments: Pt requiring mod verbal cuing for all tasks, and increased time to process questions and situations. Attention: Selective Selective Attention: Impaired Selective Attention Impairment: Verbal basic, Functional basic Memory: Impaired Memory Impairment: Storage deficit, Retrieval deficit, Decreased recall of new information Awareness: Impaired Awareness Impairment: Intellectual impairment Problem Solving: Impaired Problem Solving Impairment: Functional basic    Extremity Assessment (includes Sensation/Coordination)  Upper Extremity Assessment: Overall WFL for tasks assessed  Lower Extremity Assessment: Defer to PT evaluation    ADLs  Overall ADL's : Needs assistance/impaired Eating/Feeding: Moderate assistance Eating/Feeding Details (indicate cue type and reason): Needs full time supervision while eating Grooming: Minimal assistance, Sitting Upper Body Bathing: Minimal assistance, Sitting Lower Body Bathing: Maximal assistance, +2 for safety/equipment, Sitting/lateral leans, Sit to/from stand Upper Body Dressing : Minimal assistance, Sitting Lower Body Dressing: Moderate assistance, Sitting/lateral leans, Sit to/from stand, +2 for safety/equipment Toilet Transfer: Moderate assistance, +2 for safety/equipment, Stand-pivot Toileting- Clothing Manipulation and Hygiene: +2 for safety/equipment, Maximal assistance, Sit to/from stand, Sitting/lateral lean Functional mobility during ADLs: Moderate assistance, +2 for safety/equipment, Rolling walker (2 wheels) General ADL Comments: Pt overall at a mod level due to weakness and difficulty sequencing    Mobility  Overal bed mobility: Needs Assistance Bed Mobility: Supine to Sit, Sit to Supine Supine to sit: Min assist Sit to supine: Min guard General bed mobility comments: Min assist to facilitate transfer to EOB from supine. Min guard for safety back into bed. Requires verbal cues for technique.  requires extra time.    Transfers  Overall transfer level: Needs assistance Equipment used: Rolling walker (2 wheels) Transfers: Sit to/from Stand Sit to Stand: Min assist General transfer comment: Min assist for boost to stand from bed, cues for hand placement. Upon standing stable with UE support on RW.    Ambulation / Gait / Stairs / Wheelchair Mobility  Ambulation/Gait Ambulation/Gait assistance: Herbalist (Feet): 22 Feet Assistive device: Rolling walker (2 wheels) Gait Pattern/deviations: Step-through pattern, Decreased stride length, Wide base of support General Gait Details: Gait slow, knees slightly flexed throughout gait cycle with wide BOS. Pt requires VC for awareness of surroundings (open eyes and scan envionment.) Min assist to assist with RW during turns and in congested spaces. No overt buckling occurred however some instability present but able to self correct with support on RW. Gait velocity:  slow Gait velocity interpretation: <1.31 ft/sec, indicative of household ambulator    Posture / Balance Dynamic Sitting Balance Sitting balance - Comments: slight Rightward lean but able to self correct with VC only. Balance Overall balance assessment: Needs assistance Sitting-balance support: No upper extremity supported, Feet supported Sitting balance-Leahy Scale: Good Sitting balance - Comments: slight Rightward lean but able to self correct with VC only. Postural control: Right lateral lean Standing balance support: No upper extremity supported Standing balance-Leahy Scale: Fair    Special needs/care consideration Skin ***   Previous Home Environment (from acute therapy documentation) Living Arrangements: Children  Lives With: Son Available Help at Discharge: Family Type of Home: House Home Layout: Full bath on main level, Able to live on main level with bedroom/bathroom Home Access: Stairs to enter Entrance Stairs-Rails: Left Entrance Stairs-Number of  Steps: 4 Bathroom Shower/Tub: Tub/shower unit, Architectural technologist: Handicapped height Bathroom Accessibility: Yes How Accessible: Accessible via walker  Discharge Living Setting Plans for Discharge Living Setting: Patient's home Type of Home at Discharge: House Discharge Home Layout: Two level, Able to live on main level with bedroom/bathroom Alternate Level Stairs-Rails: Left Alternate Level Stairs-Number of Steps: full flight Discharge Home Access: Stairs to enter Entrance Stairs-Rails: Left Entrance Stairs-Number of Steps: 4 Discharge Bathroom Shower/Tub: Tub/shower unit Discharge Bathroom Toilet: Handicapped height Discharge Bathroom Accessibility: Yes How Accessible: Accessible via walker, Accessible via wheelchair  [place] Patient Roles: Other (Comment) Contact Information: [phone] Anticipated Caregiver: Earma Reading Ability/Limitations of Caregiver: Can provide min A Caregiver Availability: 24/7 Discharge Plan Discussed with Primary Caregiver: Yes Is Caregiver In Agreement with Plan?: Yes Does Caregiver/Family have Issues with Lodging/Transportation while Pt is in Rehab?: No  Goals Patient/Family Goal for Rehab: PT/OT/SLP Supervisoin Expected length of stay: 12-14 days Pt/Family Agrees to Admission and willing to participate: Yes Program Orientation Provided & Reviewed with Pt/Caregiver Including Roles  & Responsibilities: Yes  Decrease burden of Care through IP rehab admission: Specialzed equipment needs, Diet advancement, Decrease number of caregivers, Bowel and bladder program, and Patient/family education  Possible need for SNF placement upon discharge: not anticipated  Patient Condition: I have reviewed medical records from [facility], spoken with CM, and son. I met with patient at the bedside and discussed via phone for inpatient rehabilitation assessment.  Patient will benefit from ongoing PT, OT, and SLP, can  actively participate in 3 hours of therapy a day 5 days of the week, and can make measurable gains during the admission.  Patient will also benefit from the coordinated team approach during an Inpatient Acute Rehabilitation admission.  The patient will receive intensive therapy as well as Rehabilitation physician, nursing, social worker, and care management interventions.  Due to safety, skin/wound care, disease management, medication administration, pain management, and patient education the patient requires 24 hour a day rehabilitation nursing.  The patient is currently *** with mobility and basic ADLs.  Discharge setting and therapy post discharge at home with home health is anticipated.  Patient has agreed to participate in the Acute Inpatient Rehabilitation Program and will admit {Time; today/tomorrow:10263}.  Preadmission Screen Completed By:  Genella Mech, 08/12/2021 1:31 PM ______________________________________________________________________   Discussed status with Dr. Marland Kitchen on *** at *** and received approval for admission today.  Admission Coordinator:  Genella Mech, CCC-SLP, time Marland KitchenSudie Grumbling ***   Assessment/Plan: Diagnosis: Does the need for close, 24 hr/day Medical supervision in concert with the patient's rehab needs make it unreasonable for this patient to be served in a  less intensive setting? {yes_no_potentially:3041433} Co-Morbidities requiring supervision/potential complications: *** Due to {due VZ:5638756}, does the patient require 24 hr/day rehab nursing? {yes_no_potentially:3041433} Does the patient require coordinated care of a physician, rehab nurse, PT, OT, and SLP to address physical and functional deficits in the context of the above medical diagnosis(es)? {yes_no_potentially:3041433} Addressing deficits in the following areas: {deficits:3041436} Can the patient actively participate in an intensive therapy program of at least 3 hrs of therapy 5 days a week?  {yes_no_potentially:3041433} The potential for patient to make measurable gains while on inpatient rehab is {potential:3041437} Anticipated functional outcomes upon discharge from inpatient rehab: {functional outcomes:304600100} PT, {functional outcomes:304600100} OT, {functional outcomes:304600100} SLP Estimated rehab length of stay to reach the above functional goals is: *** Anticipated discharge destination: {anticipated dc setting:21604} 10. Overall Rehab/Functional Prognosis: {potential:3041437}   MD Signature: ***

## 2021-08-12 NOTE — Progress Notes (Addendum)
[facility] PROGRESS NOTE   Glen Barnett QPR:[id number] DOB: 1927/04/27 DOA: 08/10/2021  PCP: Tammi Sou, MD  Brief History/Interval Summary: 87 y.o. male with a past medical history of ambulatory dysfunction uses a cane to assist with ambulation at baseline, hypertension GERD, vitamin D deficiency presented to the emergency department after sustaining a fall at home.  He was found to have slurred speech and aphasia by his family.  There was left-sided facial droop.  Patient was brought into the hospital.  Concern was for acute stroke.  He was hospitalized for further management.   Reason for Visit: Acute stroke  Consultants: Neurology  Procedures: Transthoracic echocardiogram (see report below)    Subjective/Interval History: Patient remains distracted.  However he is able to answer most of my questions.  Denies any chest pain, shortness of breath, nausea or vomiting.     Assessment/Plan:  Left-sided facial droop with aphasia Most likely secondary to acute stroke.  CT head did suggest a stroke in the right posterior frontal lobe.  MRI showed right MCA territory infarcts.   Neurology was consulted.  Echocardiogram showed EF to be 50 to 55% with mild LVH. Patient is on aspirin and Plavix. Waiting on stroke service to evaluate. LDL is 159.  HbA1c 5.8.  TSH 3.2.  EEG does not show any seizure activity Statin not initiated yet due to rhabdomyolysis. We will check a CK level tomorrow and if is significantly improved should be able to resume statin at time.  LFTs are normal. Inpatient rehabilitation recommended by physical therapy  Essential hypertension Permissive hypertension is being allowed.   Prior to admission he was on Diovan Catapres verapamil and hydrochlorothiazide.  Would like to put him back on clonidine as soon as possible to avoid significant rebound hypertension. Blood pressure seems to be reasonably well controlled.  Mild rhabdomyolysis CK  level noted to be 1085 yesterday.  Improved to 940.  Continue gentle IV hydration.  Monitor urine output.  Renal function is stable.  Hypokalemia Potassium remains low this morning.  Magnesium was 2.0.  Will give supplementation.    Leukocytosis No obvious source of infection identified.  He is afebrile.  Could be reactive.  Recheck tomorrow.  History of macular degeneration Stable.  History of vitamin D deficiency Continue home medications.  History of GERD Continue PPI.  DVT Prophylaxis: Lovenox Code Status: Full code Family Communication: Discussed with patient.  No family at bedside Disposition Plan: Inpatient rehabilitation recommended by physical therapy.  Status is: Inpatient  Remains inpatient appropriate because: Acute stroke work-up and need for rehabilitation      Medications: Scheduled:  aspirin EC  81 mg Oral Daily   cholecalciferol  2,000 Units Oral Daily   clopidogrel  75 mg Oral Daily   enoxaparin (LOVENOX) injection  40 mg Subcutaneous Q24H   multivitamin  1 tablet Oral Daily   pantoprazole  80 mg Oral Daily   potassium chloride  40 mEq Oral Once   Continuous:  potassium chloride     LDJ:[id number] **OR** acetaminophen (TYLENOL) oral liquid 160 mg/5 mL **OR** acetaminophen, ondansetron (ZOFRAN) IV, senna-docusate  Antibiotics: Anti-infectives (From admission, onward)    None       Objective:  Vital Signs  Vitals:   08/11/21 1953 08/11/21 2351 08/12/21 0000 08/12/21 0416  BP: 111/68 (!) 155/82  123/79  Pulse: 94 (!) 109  96  Resp: 20 (!) 23 (!) 22 19  Temp: 98 F (36.7 C) 98.1 F (36.7 C)  98.7  F (37.1 C)  TempSrc: Oral Oral  Oral  SpO2: 94% 91%  95%    Intake/Output Summary (Last 24 hours) at 08/12/2021 0844 Last data filed at 08/11/2021 1706 Gross per 24 hour  Intake 240 ml  Output 400 ml  Net -160 ml    There were no vitals filed for this visit.  General appearance: Awake alert.  In no distress.  Distracted Resp:  Clear to auscultation bilaterally.  Normal effort Cardio: S1-S2 is normal regular.  No S3-S4.  No rubs murmurs or bruit GI: Abdomen is soft.  Nontender nondistended.  Bowel sounds are present normal.  No masses organomegaly Extremities: No edema.  Moving all of his extremities Neurologic: Noted to have left-sided facial droop.  No obvious motor strength deficits noted.    Lab Results:  Data Reviewed: I have personally reviewed following labs and imaging studies  CBC: Recent Labs  Lab 08/10/21 1556 08/10/21 1621 08/12/21 0222  WBC 12.6*  --  17.9*  NEUTROABS 10.3*  --   --   HGB 14.3 15.0 15.2  HCT 41.0 44.0 42.9  MCV 95.6  --  94.7  PLT 356  --  319     Basic Metabolic Panel: Recent Labs  Lab 08/10/21 1556 08/10/21 1621 08/12/21 0222  NA 134* 135 133*  K 3.3* 3.3* 2.9*  CL 98 98 98  CO2 25  --  24  GLUCOSE 120* 120* 128*  BUN 18 24* 22  CREATININE 0.94 0.90 0.91  CALCIUM 9.0  --  8.4*  MG  --   --  2.0     GFR: CrCl cannot be calculated (Unknown ideal weight.).  Liver Function Tests: Recent Labs  Lab 08/10/21 1556 08/12/21 0222  AST 58* 38  ALT 26 25  ALKPHOS 97 88  BILITOT 0.7 1.0  PROT 6.6 6.2*  ALBUMIN 3.5 3.0*       Coagulation Profile: Recent Labs  Lab 08/10/21 1556  INR 1.0     Cardiac Enzymes: Recent Labs  Lab 08/10/21 2000 08/11/21 0339  CKTOTAL 1,185* 940*      HbA1C: Recent Labs    08/11/21 0339  HGBA1C 5.8*       Lipid Profile: Recent Labs    08/11/21 0339  CHOL 243*  HDL 68  LDLCALC 159*  TRIG 81  CHOLHDL 3.6     Thyroid Function Tests: Recent Labs    08/11/21 0339  TSH 3.260      Recent Results (from the past 240 hour(s))  Resp Panel by RT-PCR (Flu A&B, Covid) Nasopharyngeal Swab     Status: None   Collection Time: 08/10/21  3:56 PM   Specimen: Nasopharyngeal Swab; Nasopharyngeal(NP) swabs in vial transport medium  Result Value Ref Range Status   SARS Coronavirus 2 by RT PCR NEGATIVE  NEGATIVE Final    Comment: (NOTE) SARS-CoV-2 target nucleic acids are NOT DETECTED.  The SARS-CoV-2 RNA is generally detectable in upper respiratory specimens during the acute phase of infection. The lowest concentration of SARS-CoV-2 viral copies this assay can detect is 138 copies/mL. A negative result does not preclude SARS-Cov-2 infection and should not be used as the sole basis for treatment or other patient management decisions. A negative result may occur with  improper specimen collection/handling, submission of specimen other than nasopharyngeal swab, presence of viral mutation(s) within the areas targeted by this assay, and inadequate number of viral copies(<138 copies/mL). A negative result must be combined with clinical observations, patient history, and epidemiological information. The  expected result is Negative.  Fact Sheet for Patients:  EntrepreneurPulse.com.au  Fact Sheet for Healthcare Providers:  IncredibleEmployment.be  This test is no t yet approved or cleared by the [place] FDA and  has been authorized for detection and/or diagnosis of SARS-CoV-2 by FDA under an Emergency Use Authorization (EUA). This EUA will remain  in effect (meaning this test can be used) for the duration of the COVID-19 declaration under Section 564(b)(1) of the Act, 21 U.S.C.section 360bbb-3(b)(1), unless the authorization is terminated  or revoked sooner.       Influenza A by PCR NEGATIVE NEGATIVE Final   Influenza B by PCR NEGATIVE NEGATIVE Final    Comment: (NOTE) The Xpert Xpress SARS-CoV-2/FLU/RSV plus assay is intended as an aid in the diagnosis of influenza from Nasopharyngeal swab specimens and should not be used as a sole basis for treatment. Nasal washings and aspirates are unacceptable for Xpert Xpress SARS-CoV-2/FLU/RSV testing.  Fact Sheet for Patients: EntrepreneurPulse.com.au  Fact Sheet for Healthcare  Providers: IncredibleEmployment.be  This test is not yet approved or cleared by the [place] FDA and has been authorized for detection and/or diagnosis of SARS-CoV-2 by FDA under an Emergency Use Authorization (EUA). This EUA will remain in effect (meaning this test can be used) for the duration of the COVID-19 declaration under Section 564(b)(1) of the Act, 21 U.S.C. section 360bbb-3(b)(1), unless the authorization is terminated or revoked.  Performed at [facility], [place] [address], [place], [place] [address]        Radiology Studies: CT ANGIO HEAD NECK W WO CM  Result Date: 08/10/2021 CLINICAL DATA:  Stroke suspected, neck pain, trauma EXAM: CT ANGIOGRAPHY HEAD AND NECK CT CERVICAL SPINE WITHOUT CONTRAST TECHNIQUE: Multidetector CT imaging of the head and neck was performed using the standard protocol during bolus administration of intravenous contrast. Multiplanar CT image reconstructions and MIPs were obtained to evaluate the vascular anatomy. Carotid stenosis measurements (when applicable) are obtained utilizing NASCET criteria, using the distal internal carotid diameter as the denominator. Multidetector CT imaging of the cervical spine was performed without intravenous contrast. Multiplanar CT image reconstructions were also generated. CONTRAST:  23mL OMNIPAQUE IOHEXOL 350 MG/ML SOLN COMPARISON:  No prior CT of the head or neck, correlation is made with MRI head 03/12/2014. FINDINGS: CT HEAD FINDINGS Brain: Area of low density in the lateral right posterior frontal lobe (series 5, image 24), which is new compared to the prior exam and may represent acute or subacute infarct. No acute hemorrhage, mass, mass effect, or midline shift. Advanced cerebral volume loss, not significantly changed compared to 2015. No extra-axial collection. Unchanged size and configuration of the ventricles. Vascular: No hyperdense vessel. Skull: No acute osseous abnormality. Sinuses:  Imaged portions are clear. Orbits: Status post bilateral lens replacements. Review of the MIP images confirms the above findings CT NECK FINDINGS Alignment: Trace anterolisthesis C7 on T1, which appears degenerative. Skull base and vertebrae: No acute fracture. No primary bone lesion or focal pathologic process. Soft tissues and spinal canal: No prevertebral fluid or swelling. No visible canal hematoma. Disc levels: Severe disc height loss C3-C7. moderate spinal canal stenosis at C6-C7. Multilevel facet and uncovertebral hypertrophy, with severe neural foraminal narrowing on the right at C3-C4 and C5-C6. Other: None. CTA NECK FINDINGS Aortic arch: Standard branching. Imaged portion shows no evidence of aneurysm or dissection. No significant stenosis of the major arch vessel origins. Right carotid system: No evidence of dissection, stenosis (50% or greater) or occlusion. Retropharyngeal course. Left  carotid system: No evidence of dissection, stenosis (50% or greater) or occlusion. Vertebral arteries: Codominant. No evidence of dissection, stenosis (50% or greater) or occlusion. Other neck: Negative Upper chest: No focal pulmonary opacity or pleural effusion. Review of the MIP images confirms the above findings CTA HEAD FINDINGS Anterior circulation: Both internal carotid arteries are patent to the termini, with mild irregularity but without stenosis or other abnormality. A1 segments patent. Normal anterior communicating artery. Anterior cerebral arteries are patent to their distal aspects. No M1 stenosis or occlusion. Normal MCA bifurcations. Distal MCA branches are slightly asymmetric but appear perfused. Posterior circulation: Vertebral arteries widely patent to the vertebrobasilar junction without stenosis. The right posteroinferior cerebellar artery is not visualized. Basilar patent to its distal aspect. Superior cerebral arteries patent bilaterally. Focal narrowing of the left P1-P2 junction (series 11, images  105-108). PCAs are otherwise perfused to their distal aspects without stenosis. The posterior communicating arteries are not visualized. Venous sinuses: As permitted by contrast timing, patent. Anatomic variants: None significant Review of the MIP images confirms the above findings IMPRESSION: 1. Area of low density in the lateral right posterior frontal lobe, which is new compared to 2015 but is of indeterminate acuity. If there is concern for infarct in this territory, an MRI is recommended. 2. No acute fracture or traumatic subluxation in the cervical spine. 3. Focal narrowing of the left P1-P2 junction. Otherwise no significant intracranial stenosis. No large vessel occlusion. 4. No hemodynamically significant stenosis in the neck. These results were called by telephone at the time of interpretation on 08/10/2021 at 6:40 pm to provider [facility] , who verbally acknowledged these results. Electronically Signed   By: Merilyn Baba M.D.   On: 08/10/2021 18:40   DG Chest 1 View  Result Date: 08/10/2021 CLINICAL DATA:  Cough. EXAM: CHEST  1 VIEW COMPARISON:  Chest x-ray 07/13/2019. FINDINGS: There is a large hiatal hernia similar to the prior study. Cardiomediastinal silhouette is stable and within normal limits. There are atherosclerotic calcifications of the aorta. The lungs and costophrenic angles are clear. There is no pneumothorax. No acute fractures are seen. IMPRESSION: 1. Stable large hiatal hernia. 2. No acute cardiopulmonary process. Electronically Signed   By: Ronney Asters M.D.   On: 08/10/2021 21:26   MR BRAIN WO CONTRAST  Result Date: 08/11/2021 CLINICAL DATA:  Neuro deficit with acute stroke suspected. EXAM: MRI HEAD WITHOUT CONTRAST TECHNIQUE: Multiplanar, multiecho pulse sequences of the brain and surrounding structures were obtained without intravenous contrast. COMPARISON:  Head CT from yesterday FINDINGS: Brain: Acute cortically based infarcts in the right posterior frontal and  separately in the right parietal lobes, both MCA territory. No hemorrhage, hydrocephalus, or masslike finding. Generalized brain atrophy with variable sulcal widening of indeterminate cause. Chronic small vessel ischemia in the hemispheric white matter and pons, mild for age. Vascular: Major flow voids are preserved Skull and upper cervical spine: Negative Sinuses/Orbits: Bilateral cataract resection and staphyloma. IMPRESSION: 1. Acute right MCA branch infarcts affecting frontal and parietal cortex. 2. Significant brain atrophy. Electronically Signed   By: Jorje Guild M.D.   On: 08/11/2021 08:19   CT C-SPINE NO CHARGE  Result Date: 08/10/2021 CLINICAL DATA:  Stroke suspected, neck pain, trauma EXAM: CT ANGIOGRAPHY HEAD AND NECK CT CERVICAL SPINE WITHOUT CONTRAST TECHNIQUE: Multidetector CT imaging of the head and neck was performed using the standard protocol during bolus administration of intravenous contrast. Multiplanar CT image reconstructions and MIPs were obtained to evaluate the vascular anatomy. Carotid stenosis measurements (  when applicable) are obtained utilizing NASCET criteria, using the distal internal carotid diameter as the denominator. Multidetector CT imaging of the cervical spine was performed without intravenous contrast. Multiplanar CT image reconstructions were also generated. CONTRAST:  56mL OMNIPAQUE IOHEXOL 350 MG/ML SOLN COMPARISON:  No prior CT of the head or neck, correlation is made with MRI head 03/12/2014. FINDINGS: CT HEAD FINDINGS Brain: Area of low density in the lateral right posterior frontal lobe (series 5, image 24), which is new compared to the prior exam and may represent acute or subacute infarct. No acute hemorrhage, mass, mass effect, or midline shift. Advanced cerebral volume loss, not significantly changed compared to 2015. No extra-axial collection. Unchanged size and configuration of the ventricles. Vascular: No hyperdense vessel. Skull: No acute osseous  abnormality. Sinuses: Imaged portions are clear. Orbits: Status post bilateral lens replacements. Review of the MIP images confirms the above findings CT NECK FINDINGS Alignment: Trace anterolisthesis C7 on T1, which appears degenerative. Skull base and vertebrae: No acute fracture. No primary bone lesion or focal pathologic process. Soft tissues and spinal canal: No prevertebral fluid or swelling. No visible canal hematoma. Disc levels: Severe disc height loss C3-C7. moderate spinal canal stenosis at C6-C7. Multilevel facet and uncovertebral hypertrophy, with severe neural foraminal narrowing on the right at C3-C4 and C5-C6. Other: None. CTA NECK FINDINGS Aortic arch: Standard branching. Imaged portion shows no evidence of aneurysm or dissection. No significant stenosis of the major arch vessel origins. Right carotid system: No evidence of dissection, stenosis (50% or greater) or occlusion. Retropharyngeal course. Left carotid system: No evidence of dissection, stenosis (50% or greater) or occlusion. Vertebral arteries: Codominant. No evidence of dissection, stenosis (50% or greater) or occlusion. Other neck: Negative Upper chest: No focal pulmonary opacity or pleural effusion. Review of the MIP images confirms the above findings CTA HEAD FINDINGS Anterior circulation: Both internal carotid arteries are patent to the termini, with mild irregularity but without stenosis or other abnormality. A1 segments patent. Normal anterior communicating artery. Anterior cerebral arteries are patent to their distal aspects. No M1 stenosis or occlusion. Normal MCA bifurcations. Distal MCA branches are slightly asymmetric but appear perfused. Posterior circulation: Vertebral arteries widely patent to the vertebrobasilar junction without stenosis. The right posteroinferior cerebellar artery is not visualized. Basilar patent to its distal aspect. Superior cerebral arteries patent bilaterally. Focal narrowing of the left P1-P2  junction (series 11, images 105-108). PCAs are otherwise perfused to their distal aspects without stenosis. The posterior communicating arteries are not visualized. Venous sinuses: As permitted by contrast timing, patent. Anatomic variants: None significant Review of the MIP images confirms the above findings IMPRESSION: 1. Area of low density in the lateral right posterior frontal lobe, which is new compared to 2015 but is of indeterminate acuity. If there is concern for infarct in this territory, an MRI is recommended. 2. No acute fracture or traumatic subluxation in the cervical spine. 3. Focal narrowing of the left P1-P2 junction. Otherwise no significant intracranial stenosis. No large vessel occlusion. 4. No hemodynamically significant stenosis in the neck. These results were called by telephone at the time of interpretation on 08/10/2021 at 6:40 pm to provider [facility] , who verbally acknowledged these results. Electronically Signed   By: Merilyn Baba M.D.   On: 08/10/2021 18:40   EEG adult  Result Date: 08/11/2021 Derek Jack, MD     08/11/2021  8:36 PM Routine EEG Report Itay Mella is a 87 y.o. male with a history of  stroke who is undergoing an EEG to evaluate for seizures. Report: This EEG was acquired with electrodes placed according to the International 10-20 electrode system (including Fp1, Fp2, F3, F4, C3, C4, P3, P4, O1, O2, T3, T4, T5, T6, A1, A2, Fz, Cz, Pz). The following electrodes were missing or displaced: none. The occipital dominant rhythm was 8.5 Hz. This activity is reactive to stimulation. Drowsiness was manifested by background fragmentation; deeper stages of sleep were identified by K complexes and sleep spindles. There was no focal slowing. There were no interictal epileptiform discharges. There were no electrographic seizures identified. Photic stimulation and hyperventilation were not performed. Impression: This EEG was obtained while awake and asleep and is  normal.   Clinical Correlation: Normal EEGs, however, do not rule out epilepsy. Su Monks, MD [facility] [phone] If 7pm- 7am, please page neurology on call as listed in [place].   ECHOCARDIOGRAM COMPLETE  Result Date: 08/11/2021    ECHOCARDIOGRAM REPORT   Patient Name:   LAVARIS SEXSON Date of Exam: 08/11/2021 Medical Rec #:  [id number]              Height:       70.0 in Accession #:    [id number]             Weight:       195.2 lb Date of Birth:  1927-04-21              BSA:          2.066 m Patient Age:    59 years               BP:           163/94 mmHg Patient Gender: M                      HR:           94 bpm. Exam Location:  Inpatient Procedure: 2D Echo, Cardiac Doppler and Color Doppler Indications:    Stroke  History:        Patient has no prior history of Echocardiogram examinations.                 Stroke; Risk Factors:Hyperlipidemia and Hypertension.  Sonographer:    Glo Herring Referring Phys: [place]  1. No LV mural thrombus observed with Definity contrast. Wall motion abnormalities are suggestive of ischemic cardiomyopathy although stress induced cardiomyopathy is not excluded.  2. Left ventricular ejection fraction, by estimation, is 50 to 55%. The left ventricle has low normal function. The left ventricle demonstrates regional wall motion abnormalities (see scoring diagram/findings for description). There is moderate concentric left ventricular hypertrophy. Left ventricular diastolic parameters are indeterminate.  3. Right ventricular systolic function is normal. The right ventricular size is normal. Tricuspid regurgitation signal is inadequate for assessing PA pressure.  4. The mitral valve is grossly normal. Trivial mitral valve regurgitation.  5. The aortic valve is tricuspid with significantly restricted motion of the noncoronary cusp. There is moderate calcification of the aortic valve. Aortic valve regurgitation is mild. Mild  aortic valve stenosis. Aortic valve mean gradient measures 8.0 mmHg. Dimentionless index 0.42.  6. The inferior vena cava is normal in size with greater than 50% respiratory variability, suggesting right atrial pressure of 3 mmHg. Comparison(s): No prior Echocardiogram. FINDINGS  Left Ventricle: Left ventricular ejection fraction, by estimation, is 50 to 55%. The left ventricle has low normal function. The left  ventricle demonstrates regional wall motion abnormalities. The left ventricular internal cavity size was normal in size. There is moderate concentric left ventricular hypertrophy. Left ventricular diastolic parameters are indeterminate.  LV Wall Scoring: The entire apex is akinetic. The mid anteroseptal segment is hypokinetic. The anterior wall, antero-lateral wall, inferior wall, posterior wall, basal anteroseptal segment, mid inferoseptal segment, and basal inferoseptal segment are normal. Right Ventricle: The right ventricular size is normal. No increase in right ventricular wall thickness. Right ventricular systolic function is normal. Tricuspid regurgitation signal is inadequate for assessing PA pressure. Left Atrium: Left atrial size was normal in size. Right Atrium: Right atrial size was normal in size. Pericardium: There is no evidence of pericardial effusion. Mitral Valve: The mitral valve is grossly normal. Trivial mitral valve regurgitation. Tricuspid Valve: The tricuspid valve is not well visualized. Tricuspid valve regurgitation is trivial. Aortic Valve: The aortic valve is tricuspid. There is moderate calcification of the aortic valve. There is mild aortic valve annular calcification. Aortic valve regurgitation is mild. Mild aortic stenosis is present. Aortic valve mean gradient measures 8.0 mmHg. Aortic valve peak gradient measures 15.5 mmHg. Aortic valve area, by VTI measures 1.31 cm. Pulmonic Valve: The pulmonic valve was not well visualized. Pulmonic valve regurgitation is trivial. Aorta:  The aortic root is normal in size and structure. Venous: The inferior vena cava is normal in size with greater than 50% respiratory variability, suggesting right atrial pressure of 3 mmHg. IAS/Shunts: No atrial level shunt detected by color flow Doppler.  LEFT VENTRICLE PLAX 2D LVIDd:         4.10 cm LVIDs:         2.70 cm LV PW:         1.30 cm LV IVS:        1.30 cm LVOT diam:     2.00 cm LV SV:         46 LV SV Index:   22 LVOT Area:     3.14 cm  LV Volumes (MOD) LV vol d, MOD A2C: 118.0 ml LV vol d, MOD A4C: 223.0 ml LV vol s, MOD A2C: 76.0 ml LV vol s, MOD A4C: 82.3 ml LV SV MOD A2C:     42.0 ml LV SV MOD A4C:     223.0 ml LV SV MOD BP:      82.1 ml IVC IVC diam: 1.90 cm LEFT ATRIUM             Index LA diam:        4.90 cm 2.37 cm/m LA Vol (A2C):   99.8 ml 48.31 ml/m LA Vol (A4C):   77.8 ml 37.66 ml/m LA Biplane Vol: 87.9 ml 42.55 ml/m  AORTIC VALVE                     PULMONIC VALVE AV Area (Vmax):    1.40 cm      PV Vmax:       0.83 m/s AV Area (Vmean):   1.40 cm      PV Peak grad:  2.8 mmHg AV Area (VTI):     1.31 cm AV Vmax:           197.00 cm/s AV Vmean:          131.000 cm/s AV VTI:            0.350 m AV Peak Grad:      15.5 mmHg AV Mean Grad:      8.0 mmHg LVOT  Vmax:         87.55 cm/s LVOT Vmean:        58.350 cm/s LVOT VTI:          0.146 m LVOT/AV VTI ratio: 0.42  AORTA Ao Root diam: 3.60 cm Ao Asc diam:  3.10 cm  SHUNTS Systemic VTI:  0.15 m Systemic Diam: 2.00 cm Rozann Lesches MD Electronically signed by Rozann Lesches MD Signature Date/Time: 08/11/2021/12:31:23 PM    Final        LOS: 1 day   [facility] Pager on [email or web address]  08/12/2021, 8:44 AM

## 2021-08-13 ENCOUNTER — Inpatient Hospital Stay (HOSPITAL_COMMUNITY): Payer: Medicare Other

## 2021-08-13 ENCOUNTER — Other Ambulatory Visit: Payer: Self-pay | Admitting: Student

## 2021-08-13 DIAGNOSIS — I10 Essential (primary) hypertension: Secondary | ICD-10-CM | POA: Diagnosis not present

## 2021-08-13 DIAGNOSIS — I639 Cerebral infarction, unspecified: Secondary | ICD-10-CM

## 2021-08-13 DIAGNOSIS — D72829 Elevated white blood cell count, unspecified: Secondary | ICD-10-CM | POA: Diagnosis not present

## 2021-08-13 DIAGNOSIS — E876 Hypokalemia: Secondary | ICD-10-CM | POA: Diagnosis not present

## 2021-08-13 LAB — CBC
HCT: 38.8 % — ABNORMAL LOW (ref 39.0–52.0)
Hemoglobin: 13.2 g/dL (ref 13.0–17.0)
MCH: 33.2 pg (ref 26.0–34.0)
MCHC: 34 g/dL (ref 30.0–36.0)
MCV: 97.5 fL (ref 80.0–100.0)
Platelets: 310 10*3/uL (ref 150–400)
RBC: 3.98 MIL/uL — ABNORMAL LOW (ref 4.22–5.81)
RDW: 14.5 % (ref 11.5–15.5)
WBC: 11.2 10*3/uL — ABNORMAL HIGH (ref 4.0–10.5)
nRBC: 0 % (ref 0.0–0.2)

## 2021-08-13 LAB — CK: Total CK: 128 U/L (ref 49–397)

## 2021-08-13 LAB — COMPREHENSIVE METABOLIC PANEL
ALT: 21 U/L (ref 0–44)
AST: 25 U/L (ref 15–41)
Albumin: 2.8 g/dL — ABNORMAL LOW (ref 3.5–5.0)
Alkaline Phosphatase: 66 U/L (ref 38–126)
Anion gap: 8 (ref 5–15)
BUN: 29 mg/dL — ABNORMAL HIGH (ref 8–23)
CO2: 23 mmol/L (ref 22–32)
Calcium: 8.2 mg/dL — ABNORMAL LOW (ref 8.9–10.3)
Chloride: 106 mmol/L (ref 98–111)
Creatinine, Ser: 0.87 mg/dL (ref 0.61–1.24)
GFR, Estimated: >60 mL/min (ref >60)
Glucose, Bld: 103 mg/dL — ABNORMAL HIGH (ref 70–99)
Potassium: 3.6 mmol/L (ref 3.5–5.1)
Sodium: 137 mmol/L (ref 135–145)
Total Bilirubin: 0.8 mg/dL (ref 0.3–1.2)
Total Protein: 5.7 g/dL — ABNORMAL LOW (ref 6.5–8.1)

## 2021-08-13 MED ORDER — POTASSIUM CHLORIDE 20 MEQ PO PACK
40.0000 meq | PACK | Freq: Once | ORAL | Status: AC
  Administered 2021-08-13: 40 meq via ORAL
  Filled 2021-08-13: qty 2

## 2021-08-13 NOTE — Progress Notes (Signed)
Speech Language Pathology Treatment: Dysphagia;Cognitive-Linquistic  Patient Details Name: Glen Barnett MRN: [id number] DOB: Sep 06, 1927 Today's Date: 08/13/2021 Time: 1100-1120 SLP Time Calculation (min) (ACUTE ONLY): 20 min  Assessment / Plan / Recommendation Clinical Impression  Pt presents with improved communication ability, but ongoing attention and awareness impairment. SLP discussed current diet recommendation s with pt and expectation needed for upgrade - pt ability to masticate solids and clear residue from left buccal cavity. Despite frequent verbal and tactile reminders pt was unable to sustain attention to task or initiation clearance of bolus. He was verbose and mildly inappropriate during session with redirection needed. Pt was able to tolerate thin liquids in isolation, but when given a cup of water with a straw to assist in clearing oral residue pt had a significant coughing event with brief desaturation during ineffective coughing. MBS warranted to determine least restrictive textures.   HPI HPI: Pt is a 87 y.o. male who presented to the ED after sustaining a fall at home. He was found to have slurred speech and aphasia by his family. MRI showed right MCA affecting frontal and parietal regions. Failed [facility] Swallow Screen 08/10/21. PMH: ambulatory dysfunction uses a cane to assist with ambulation at baseline, hypertension GERD, vitamin D deficiency.      SLP Plan  MBS      Recommendations for follow up therapy are one component of a multi-disciplinary discharge planning process, led by the attending physician.  Recommendations may be updated based on patient status, additional functional criteria and insurance authorization.    Recommendations  Diet recommendations: Dysphagia 1 (puree);Nectar-thick liquid Liquids provided via: Cup;Straw Medication Administration: Crushed with puree Supervision: Staff to assist with self feeding Compensations: Minimize environmental  distractions;Follow solids with liquid;Lingual sweep for clearance of pocketing                Oral Care Recommendations: Oral care before and after PO Follow Up Recommendations: Acute inpatient rehab (3hours/day) Assistance recommended at discharge: Frequent or constant Supervision/Assistance SLP Visit Diagnosis: Cognitive communication deficit (R41.841);Dysarthria and anarthria (R47.1) Plan: MBS       GO               Herbie Baltimore, MA CCC-SLP  Ac[facility] Services Pager [phone] Office [phone]  Lynann Beaver  08/13/2021, 11:45 AM

## 2021-08-13 NOTE — Progress Notes (Addendum)
Physical Therapy Treatment Patient Details Name: Glen Barnett MRN: [id number] DOB: 1927-02-05 Today's Date: 08/13/2021   History of Present Illness 87 y.o. male presenting to the emergency department after sustaining a fall at home. MRI showed right MCA territory infarcts. PMH of ambulatory dysfunction uses a cane to assist with ambulation at baseline, hypertension GERD, vitamin D deficiency    PT Comments    Pt received in supine and agreeable to therapy session with a little encouragement. Pt performed exercises in supine and seated with good participation, states that he feels "too lazy" to do his exercises sometimes but with explanation of benefits and reduced stroke risk, he is agreeable to perform throughout day. Emphasis on upright posture and safe proximity to walker with dense cues needed during household distance gait tasks. Pt has a tendency to gaze downward and keeps eyes closed, verbal cues for environmental scanning/upright gaze. Continue to recommend CIR upon discharge as pt making good progress toward goals. Pt continues to benefit from PT services to progress toward functional mobility goals.       Recommendations for follow up therapy are one component of a multi-disciplinary discharge planning process, led by the attending physician.  Recommendations may be updated based on patient status, additional functional criteria and insurance authorization.  Follow Up Recommendations  Acute inpatient rehab (3hours/day)     Assistance Recommended at Discharge Intermittent Supervision/Assistance (Physical assist with OOB mobility)  Equipment Recommendations  Other (comment) (TBD next venue of care)    Recommendations for Other Services       Precautions / Restrictions Precautions Precautions: Fall Restrictions Weight Bearing Restrictions: No     Mobility  Bed Mobility Overal bed mobility: Needs Assistance Bed Mobility: Supine to Sit;Sit to Supine    Supine to  sit: Min guard    General bed mobility comments: minG for supine to sit with cues for safe hand placement and BLE management, requires extra time    Transfers Overall transfer level: Needs assistance Equipment used: Rolling walker (2 wheels) Transfers: Sit to/from Stand Sit to Stand: Min assist        General transfer comment: Min assist for boost to stand from bed, cues for hand placement. Sable when standing with UE support on RW. Trunk flexed    Ambulation/Gait Ambulation/Gait assistance: Min assist Gait Distance (Feet): 20 Feet (20 ft, standing rest break, 20 ft) Assistive device: Rolling walker (2 wheels) Gait Pattern/deviations: Step-through pattern;Decreased stride length;Wide base of support;Trunk flexed Gait velocity: Decreased     General Gait Details: Slow gait, trunk flexed with frquent cues for upright posture and proximity to RW. Pt requires VC to open eyes and determine surroundings.    Modified Rankin (Stroke Patients Only) Modified Rankin (Stroke Patients Only) Pre-Morbid Rankin Score: Moderate disability Modified Rankin: Moderately severe disability     Balance Overall balance assessment: Needs assistance Sitting-balance support: No upper extremity supported;Feet supported Sitting balance-Leahy Scale: Good     Standing balance support: Bilateral upper extremity supported;During functional activity;Reliant on assistive device for balance Standing balance-Leahy Scale: Fair Standing balance comment: Pt worried about L knee buckling while standing, no overt buckling during session      Cognition Arousal/Alertness: Awake/alert Behavior During Therapy: WFL for tasks assessed/performed Overall Cognitive Status: No family/caregiver present to determine baseline cognitive functioning Area of Impairment: Attention;Following commands;Safety/judgement;Awareness;Problem solving      Current Attention Level: Sustained   Following Commands: Follows one step  commands inconsistently;Follows one step commands with increased time Safety/Judgement: Decreased awareness of  safety;Decreased awareness of deficits Awareness: Intellectual Problem Solving: Slow processing;Difficulty sequencing;Requires verbal cues;Requires tactile cues          Exercises Other Exercises Other Exercises: Supine: ankle pumps x5 Other Exercises: Seated: Marching x5, ankle pumps x5, LAQ x5    General Comments General comments (skin integrity, edema, etc.): VSS on RA, HR~97 with exertion; pt requires encouragement to complete exercises, says he knows how to do them but that he's just "too lazy"      Pertinent Vitals/Pain Pain Assessment: No/denies pain     PT Goals (current goals can now be found in the care plan section) Acute Rehab PT Goals Patient Stated Goal: Get well PT Goal Formulation: With patient/family Time For Goal Achievement: 08/25/21 Progress towards PT goals: Progressing toward goals    Frequency    Min 4X/week      PT Plan Current plan remains appropriate       AM-PAC PT "6 Clicks" Mobility   Outcome Measure  Help needed turning from your back to your side while in a flat bed without using bedrails?: A Little Help needed moving from lying on your back to sitting on the side of a flat bed without using bedrails?: A Little Help needed moving to and from a bed to a chair (including a wheelchair)?: A Little Help needed standing up from a chair using your arms (e.g., wheelchair or bedside chair)?: A Little Help needed to walk in hospital room?: A Little Help needed climbing 3-5 steps with a railing? : A Lot 6 Click Score: 17    End of Session Equipment Utilized During Treatment: Gait belt Activity Tolerance: Patient tolerated treatment well Patient left: in chair;with call bell/phone within reach;with chair alarm set Nurse Communication: Mobility status PT Visit Diagnosis: Other abnormalities of gait and mobility (R26.89);Unsteadiness  on feet (R26.81);History of falling (Z91.81);Difficulty in walking, not elsewhere classified (R26.2);Other symptoms and signs involving the nervous system (R29.898)     Time: 3888-2800 PT Time Calculation (min) (ACUTE ONLY): 25 min  Charges:  $Gait Training: 8-22 mins $Therapeutic Activity: 8-22 mins                     Evelene Croon, Student PTA CI: Carly P., PTA  Carly M Poff 08/13/2021, 3:53 PM

## 2021-08-13 NOTE — Progress Notes (Signed)
[facility] PROGRESS NOTE   Glen Barnett WUJ:[id number] DOB: August 26, 1927 DOA: 08/10/2021  PCP: Tammi Sou, MD  Brief History/Interval Summary: 87 y.o. male with a past medical history of ambulatory dysfunction uses a cane to assist with ambulation at baseline, hypertension GERD, vitamin D deficiency presented to the emergency department after sustaining a fall at home.  He was found to have slurred speech and aphasia by his family.  There was left-sided facial droop.  Patient was brought into the hospital.  Concern was for acute stroke.  He was hospitalized for further management.   Reason for Visit: Acute stroke  Consultants: Neurology  Procedures:  Transthoracic echocardiogram   1. No LV mural thrombus observed with Definity contrast. Wall motion  abnormalities are suggestive of ischemic cardiomyopathy although stress  induced cardiomyopathy is not excluded.   2. Left ventricular ejection fraction, by estimation, is 50 to 55%. The  left ventricle has low normal function. The left ventricle demonstrates  regional wall motion abnormalities (see scoring diagram/findings for  description). There is moderate  concentric left ventricular hypertrophy. Left ventricular diastolic  parameters are indeterminate.   3. Right ventricular systolic function is normal. The right ventricular  size is normal. Tricuspid regurgitation signal is inadequate for assessing  PA pressure.   4. The mitral valve is grossly normal. Trivial mitral valve  regurgitation.   5. The aortic valve is tricuspid with significantly restricted motion of  the noncoronary cusp. There is moderate calcification of the aortic valve.  Aortic valve regurgitation is mild. Mild aortic valve stenosis. Aortic  valve mean gradient measures 8.0  mmHg. Dimentionless index 0.42.   6. The inferior vena cava is normal in size with greater than 50%  respiratory variability, suggesting right atrial pressure of 3  mmHg.     Subjective/Interval History: Patient mentions that he is feeling better but nowhere close to his baseline.  Denies any chest pain shortness of breath.     Assessment/Plan:  Left-sided facial droop with aphasia Most likely secondary to acute stroke.  CT head did suggest a stroke in the right posterior frontal lobe.  MRI showed right MCA territory infarcts.  Underwent CT angiogram head and neck. Neurology was consulted.   Echocardiogram showed EF to be 50 to 55% with mild LVH. Patient is on aspirin and Plavix.  Neurology recommends aspirin and Plavix for 3 weeks followed by aspirin alone. LDL is 159.  HbA1c 5.8.  TSH 3.2.  EEG does not show any seizure activity Started on Lipitor 40 mg daily.  CK level noted to be normal today.  LFTs have been normal previously.   Inpatient rehabilitation recommended by physical therapy Neurology also recommended lower extremity Doppler study which is pending.  Also recommend 30-day cardiac event monitor as outpatient.  Will send message to cardiology.  Essential hypertension Permissive hypertension is being allowed.   Prior to admission he was on Diovan Catapres verapamil and hydrochlorothiazide.  Would like to put him back on clonidine as soon as possible to avoid significant rebound hypertension. Blood pressure is reasonably well controlled.  Plan to initiate clonidine from tomorrow.  Mild rhabdomyolysis Was evaluated at 1185.  Noted to be normal today.  Patient was hydrated.  Function stable.  Hypokalemia Potassium repleted.  Magnesium 2.0.    Leukocytosis No obvious source of infection identified.  He is afebrile.  Possibly reactive.  Improved 11.2 today.    History of macular degeneration Stable.  History of vitamin D deficiency Continue home  medications.  History of GERD Continue PPI.  DVT Prophylaxis: Lovenox Code Status: Full code Family Communication: Discussed with patient.  No family at bedside Disposition Plan:  Inpatient rehabilitation recommended by physical therapy.  Status is: Inpatient  Remains inpatient appropriate because: Acute stroke work-up and need for rehabilitation      Medications: Scheduled:  aspirin EC  81 mg Oral Daily   atorvastatin  40 mg Oral Daily   cholecalciferol  2,000 Units Oral Daily   clopidogrel  75 mg Oral Daily   enoxaparin (LOVENOX) injection  40 mg Subcutaneous Q24H   guaiFENesin  600 mg Oral BID   multivitamin  1 tablet Oral Daily   pantoprazole  80 mg Oral Daily   Continuous:   ZOX:[id number] **OR** acetaminophen (TYLENOL) oral liquid 160 mg/5 mL **OR** acetaminophen, guaiFENesin, ondansetron (ZOFRAN) IV, senna-docusate  Antibiotics: Anti-infectives (From admission, onward)    None       Objective:  Vital Signs  Vitals:   08/12/21 2000 08/13/21 0010 08/13/21 0405 08/13/21 0818  BP: 127/71 (!) 147/84 123/79 (!) 148/85  Pulse: 93 96 93 91  Resp: (!) 23 18 20 19   Temp: 98.4 F (36.9 C) 98.9 F (37.2 C) 98.6 F (37 C) 98.4 F (36.9 C)  TempSrc: Oral Oral Oral Oral  SpO2: 95% 96% 96% 94%   No intake or output data in the 24 hours ending 08/13/21 1114  There were no vitals filed for this visit.  General appearance: Awake alert.  In no distress Resp: Clear to auscultation bilaterally.  Normal effort Cardio: S1-S2 is normal regular.  No S3-S4.  No rubs murmurs or bruit GI: Abdomen is soft.  Nontender nondistended.  Bowel sounds are present normal.  No masses organomegaly Extremities: No edema.  Full range of motion of lower extremities. Neurologic: Left-sided facial droop noted.  No obvious motor strength deficits.    Lab Results:  Data Reviewed: I have personally reviewed following labs and imaging studies  CBC: Recent Labs  Lab 08/10/21 1556 08/10/21 1621 08/12/21 0222 08/13/21 0317  WBC 12.6*  --  17.9* 11.2*  NEUTROABS 10.3*  --   --   --   HGB 14.3 15.0 15.2 13.2  HCT 41.0 44.0 42.9 38.8*  MCV 95.6  --  94.7 97.5   PLT 356  --  319 310     Basic Metabolic Panel: Recent Labs  Lab 08/10/21 1556 08/10/21 1621 08/12/21 0222 08/13/21 0317  NA 134* 135 133* 137  K 3.3* 3.3* 2.9* 3.6  CL 98 98 98 106  CO2 25  --  24 23  GLUCOSE 120* 120* 128* 103*  BUN 18 24* 22 29*  CREATININE 0.94 0.90 0.91 0.87  CALCIUM 9.0  --  8.4* 8.2*  MG  --   --  2.0  --      GFR: CrCl cannot be calculated (Unknown ideal weight.).  Liver Function Tests: Recent Labs  Lab 08/10/21 1556 08/12/21 0222 08/13/21 0317  AST 58* 38 25  ALT 26 25 21   ALKPHOS 97 88 66  BILITOT 0.7 1.0 0.8  PROT 6.6 6.2* 5.7*  ALBUMIN 3.5 3.0* 2.8*       Coagulation Profile: Recent Labs  Lab 08/10/21 1556  INR 1.0     Cardiac Enzymes: Recent Labs  Lab 08/10/21 2000 08/11/21 0339 08/13/21 0317  CKTOTAL 1,185* 940* 128      HbA1C: Recent Labs    08/11/21 0339  HGBA1C 5.8*       Lipid  Profile: Recent Labs    08/15/2021 0339  CHOL 243*  HDL 68  LDLCALC 159*  TRIG 81  CHOLHDL 3.6     Thyroid Function Tests: Recent Labs    Aug 15, 2021 0339  TSH 3.260      Recent Results (from the past 240 hour(s))  Resp Panel by RT-PCR (Flu A&B, Covid) Nasopharyngeal Swab     Status: None   Collection Time: 08/10/21  3:56 PM   Specimen: Nasopharyngeal Swab; Nasopharyngeal(NP) swabs in vial transport medium  Result Value Ref Range Status   SARS Coronavirus 2 by RT PCR NEGATIVE NEGATIVE Final    Comment: (NOTE) SARS-CoV-2 target nucleic acids are NOT DETECTED.  The SARS-CoV-2 RNA is generally detectable in upper respiratory specimens during the acute phase of infection. The lowest concentration of SARS-CoV-2 viral copies this assay can detect is 138 copies/mL. A negative result does not preclude SARS-Cov-2 infection and should not be used as the sole basis for treatment or other patient management decisions. A negative result may occur with  improper specimen collection/handling, submission of specimen  other than nasopharyngeal swab, presence of viral mutation(s) within the areas targeted by this assay, and inadequate number of viral copies(<138 copies/mL). A negative result must be combined with clinical observations, patient history, and epidemiological information. The expected result is Negative.  Fact Sheet for Patients:  EntrepreneurPulse.com.au  Fact Sheet for Healthcare Providers:  IncredibleEmployment.be  This test is no t yet approved or cleared by the [place] FDA and  has been authorized for detection and/or diagnosis of SARS-CoV-2 by FDA under an Emergency Use Authorization (EUA). This EUA will remain  in effect (meaning this test can be used) for the duration of the COVID-19 declaration under Section 564(b)(1) of the Act, 21 U.S.C.section 360bbb-3(b)(1), unless the authorization is terminated  or revoked sooner.       Influenza A by PCR NEGATIVE NEGATIVE Final   Influenza B by PCR NEGATIVE NEGATIVE Final    Comment: (NOTE) The Xpert Xpress SARS-CoV-2/FLU/RSV plus assay is intended as an aid in the diagnosis of influenza from Nasopharyngeal swab specimens and should not be used as a sole basis for treatment. Nasal washings and aspirates are unacceptable for Xpert Xpress SARS-CoV-2/FLU/RSV testing.  Fact Sheet for Patients: EntrepreneurPulse.com.au  Fact Sheet for Healthcare Providers: IncredibleEmployment.be  This test is not yet approved or cleared by the [place] FDA and has been authorized for detection and/or diagnosis of SARS-CoV-2 by FDA under an Emergency Use Authorization (EUA). This EUA will remain in effect (meaning this test can be used) for the duration of the COVID-19 declaration under Section 564(b)(1) of the Act, 21 U.S.C. section 360bbb-3(b)(1), unless the authorization is terminated or revoked.  Performed at [facility], [place] [address], [place],  [place] [address]        Radiology Studies: EEG adult  Result Date: 2021/08/15 Derek Jack, MD     08-15-21  8:36 PM Routine EEG Report Glen Barnett is a 87 y.o. male with a history of stroke who is undergoing an EEG to evaluate for seizures. Report: This EEG was acquired with electrodes placed according to the International 10-20 electrode system (including Fp1, Fp2, F3, F4, C3, C4, P3, P4, O1, O2, T3, T4, T5, T6, A1, A2, Fz, Cz, Pz). The following electrodes were missing or displaced: none. The occipital dominant rhythm was 8.5 Hz. This activity is reactive to stimulation. Drowsiness was manifested by background fragmentation; deeper stages of sleep were identified by  K complexes and sleep spindles. There was no focal slowing. There were no interictal epileptiform discharges. There were no electrographic seizures identified. Photic stimulation and hyperventilation were not performed. Impression: This EEG was obtained while awake and asleep and is normal.   Clinical Correlation: Normal EEGs, however, do not rule out epilepsy. Su Monks, MD [facility] [phone] If 7pm- 7am, please page neurology on call as listed in [place].   VAS [place] LOWER EXTREMITY VENOUS (DVT)  Result Date: 08/13/2021  Lower Venous DVT Study Patient Name:  Glen Barnett  Date of Exam:   08/13/2021 Medical Rec #: [id number]               Accession #:    [id number] Date of Birth: Jan 29, 1927               Patient Gender: M Patient Age:   64 years Exam Location:  [facility] Procedure:      VAS [place] LOWER EXTREMITY VENOUS (DVT) Referring Phys: Cornelius Moras XU --------------------------------------------------------------------------------  Indications: Stroke.  Comparison Study: No prior study on file Performing Technologist: Sharion Dove RVS  Examination Guidelines: A complete evaluation includes B-mode imaging, spectral Doppler, color Doppler, and power Doppler as needed of all accessible portions  of each vessel. Bilateral testing is considered an integral part of a complete examination. Limited examinations for reoccurring indications may be performed as noted. The reflux portion of the exam is performed with the patient in reverse Trendelenburg.  +---------+---------------+---------+-----------+----------+--------------+ RIGHT    CompressibilityPhasicitySpontaneityPropertiesThrombus Aging +---------+---------------+---------+-----------+----------+--------------+ CFV      Full           Yes      Yes                                 +---------+---------------+---------+-----------+----------+--------------+ SFJ      Full                                                        +---------+---------------+---------+-----------+----------+--------------+ FV Prox  Full                                                        +---------+---------------+---------+-----------+----------+--------------+ FV Mid   Full                                                        +---------+---------------+---------+-----------+----------+--------------+ FV DistalFull                                                        +---------+---------------+---------+-----------+----------+--------------+ PFV      Full                                                        +---------+---------------+---------+-----------+----------+--------------+  POP      Full           Yes      Yes                                 +---------+---------------+---------+-----------+----------+--------------+ PTV      Full                                                        +---------+---------------+---------+-----------+----------+--------------+ PERO     Full                                                        +---------+---------------+---------+-----------+----------+--------------+   +---------+---------------+---------+-----------+----------+--------------+ LEFT      CompressibilityPhasicitySpontaneityPropertiesThrombus Aging +---------+---------------+---------+-----------+----------+--------------+ CFV      Full           Yes      Yes                                 +---------+---------------+---------+-----------+----------+--------------+ SFJ      Full                                                        +---------+---------------+---------+-----------+----------+--------------+ FV Prox  Full                                                        +---------+---------------+---------+-----------+----------+--------------+ FV Mid   Full                                                        +---------+---------------+---------+-----------+----------+--------------+ FV DistalFull                                                        +---------+---------------+---------+-----------+----------+--------------+ PFV      Full                                                        +---------+---------------+---------+-----------+----------+--------------+ POP      Full           Yes      Yes                                 +---------+---------------+---------+-----------+----------+--------------+  PTV      Full                                                        +---------+---------------+---------+-----------+----------+--------------+ PERO     Full                                                        +---------+---------------+---------+-----------+----------+--------------+     Summary: BILATERAL: - No evidence of deep vein thrombosis seen in the lower extremities, bilaterally. -No evidence of popliteal cyst, bilaterally.   *See table(s) above for measurements and observations.    Preliminary        LOS: 2 days   [facility] Pager on [email or web address]  08/13/2021, 11:14 AM

## 2021-08-13 NOTE — Progress Notes (Signed)
Inpatient Rehab Admissions Coordinator:   I do not have a CIR bed for this Pt. I will continue to follow for potential admit pending bed availability.   Clemens Catholic, [place], [place] Admissions Coordinator  [phone] ([place]) [phone] (office)

## 2021-08-13 NOTE — Progress Notes (Signed)
Ordered 30 day Event Monitor for further work-up of CVA at the request of Neurology. Patient has never been seen by our office. Therefore, will arrange New Patient visit with Dr. Johney Frame in 6-8 weeks to review monitor results.  Darreld Mclean, PA-C 08/13/2021 12:17 PM

## 2021-08-14 ENCOUNTER — Inpatient Hospital Stay (HOSPITAL_COMMUNITY): Payer: Medicare Other

## 2021-08-14 DIAGNOSIS — E876 Hypokalemia: Secondary | ICD-10-CM | POA: Diagnosis not present

## 2021-08-14 DIAGNOSIS — I639 Cerebral infarction, unspecified: Secondary | ICD-10-CM | POA: Diagnosis not present

## 2021-08-14 DIAGNOSIS — I10 Essential (primary) hypertension: Secondary | ICD-10-CM | POA: Diagnosis not present

## 2021-08-14 DIAGNOSIS — D72829 Elevated white blood cell count, unspecified: Secondary | ICD-10-CM | POA: Diagnosis not present

## 2021-08-14 MED ORDER — CLONIDINE HCL 0.1 MG PO TABS
0.1000 mg | ORAL_TABLET | Freq: Two times a day (BID) | ORAL | Status: DC
  Administered 2021-08-14 – 2021-08-16 (×5): 0.1 mg via ORAL
  Filled 2021-08-14 (×5): qty 1

## 2021-08-14 MED ORDER — POTASSIUM CHLORIDE 20 MEQ PO PACK
20.0000 meq | PACK | Freq: Once | ORAL | Status: AC
  Administered 2021-08-14: 20 meq via ORAL
  Filled 2021-08-14: qty 1

## 2021-08-14 MED ORDER — CLONIDINE HCL 0.1 MG PO TABS: 0.1000 mg | ORAL_TABLET | Freq: Every day | ORAL | Status: DC

## 2021-08-14 NOTE — Progress Notes (Signed)
[facility] PROGRESS NOTE   Milo Schreier DXA:[id number] DOB: 1927/03/18 DOA: 08/10/2021  PCP: Tammi Sou, MD  Brief History/Interval Summary: 87 y.o. male with a past medical history of ambulatory dysfunction uses a cane to assist with ambulation at baseline, hypertension GERD, vitamin D deficiency presented to the emergency department after sustaining a fall at home.  He was found to have slurred speech and aphasia by his family.  There was left-sided facial droop.  Patient was brought into the hospital.  Concern was for acute stroke.  He was hospitalized for further management.   Reason for Visit: Acute stroke  Consultants: Neurology  Procedures:  Transthoracic echocardiogram   1. No LV mural thrombus observed with Definity contrast. Wall motion  abnormalities are suggestive of ischemic cardiomyopathy although stress  induced cardiomyopathy is not excluded.   2. Left ventricular ejection fraction, by estimation, is 50 to 55%. The  left ventricle has low normal function. The left ventricle demonstrates  regional wall motion abnormalities (see scoring diagram/findings for  description). There is moderate  concentric left ventricular hypertrophy. Left ventricular diastolic  parameters are indeterminate.   3. Right ventricular systolic function is normal. The right ventricular  size is normal. Tricuspid regurgitation signal is inadequate for assessing  PA pressure.   4. The mitral valve is grossly normal. Trivial mitral valve  regurgitation.   5. The aortic valve is tricuspid with significantly restricted motion of  the noncoronary cusp. There is moderate calcification of the aortic valve.  Aortic valve regurgitation is mild. Mild aortic valve stenosis. Aortic  valve mean gradient measures 8.0  mmHg. Dimentionless index 0.42.   6. The inferior vena cava is normal in size with greater than 50%  respiratory variability, suggesting right atrial pressure of 3  mmHg.     Subjective/Interval History: Patient mentions that he continues to improve but not back to his baseline.  Denies any chest pain shortness of breath.  No nausea vomiting.  Tolerating his diet.   Assessment/Plan:  Left-sided facial droop with aphasia Most likely secondary to acute stroke.  CT head did suggest a stroke in the right posterior frontal lobe.  MRI showed right MCA territory infarcts.  Underwent CT angiogram head and neck. Neurology was consulted.   Echocardiogram showed EF to be 50 to 55% with mild LVH. Patient is on aspirin and Plavix.  Neurology recommends aspirin and Plavix for 3 weeks followed by aspirin alone. LDL is 159.  HbA1c 5.8.  TSH 3.2.  EEG does not show any seizure activity Started on Lipitor 40 mg daily.  CK level noted to be normal.  LFTs have been normal previously.   Inpatient rehabilitation recommended by physical therapy Neurology also recommended lower extremity Doppler study which was negative for DVT. Also recommend 30-day cardiac event monitor as outpatient.  Message was sent to cardiology.  Essential hypertension Permissive hypertension is being allowed.   Prior to admission he was on Diovan Catapres verapamil and hydrochlorothiazide.  Would like to put him back on clonidine as soon as possible to avoid significant rebound hypertension. Blood pressure stable.  Will resume clonidine today.  Mild rhabdomyolysis Was elevated at 1185.  Noted to be normal subsequently.  Patient was hydrated.  Renal function stable.  Hypokalemia Potassium repleted.  Magnesium 2.0.    Leukocytosis No obvious source of infection identified.  He is afebrile.  Possibly reactive.  Improved to 11.2.    History of macular degeneration Stable.  History of vitamin D deficiency  Continue home medications.  History of GERD Continue PPI.  DVT Prophylaxis: Lovenox Code Status: Full code Family Communication: Discussed with patient.  No family at  bedside Disposition Plan: Inpatient rehabilitation recommended by physical therapy.  Status is: Inpatient  Remains inpatient appropriate because: Acute stroke work-up and need for rehabilitation      Medications: Scheduled:  aspirin EC  81 mg Oral Daily   atorvastatin  40 mg Oral Daily   cholecalciferol  2,000 Units Oral Daily   clopidogrel  75 mg Oral Daily   enoxaparin (LOVENOX) injection  40 mg Subcutaneous Q24H   guaiFENesin  600 mg Oral BID   multivitamin  1 tablet Oral Daily   pantoprazole  80 mg Oral Daily   Continuous:   HTD:SKAJGOTLXBWIO **OR** acetaminophen (TYLENOL) oral liquid 160 mg/5 mL **OR** acetaminophen, guaiFENesin, ondansetron (ZOFRAN) IV, senna-docusate  Antibiotics: Anti-infectives (From admission, onward)    None       Objective:  Vital Signs  Vitals:   08/13/21 2016 08/13/21 2356 08/14/21 0434 08/14/21 0754  BP: (!) 150/72 (!) 153/82 (!) 145/87 (!) 146/74  Pulse: 95 82 77 88  Resp: 16 18 20 16   Temp: 98.1 F (36.7 C) 98.7 F (37.1 C) 98.4 F (36.9 C) 98.1 F (36.7 C)  TempSrc: Oral Oral Oral Oral  SpO2: 94% 95% 97% 95%    Intake/Output Summary (Last 24 hours) at 08/14/2021 1235 Last data filed at 08/14/2021 0530 Gross per 24 hour  Intake 117 ml  Output 500 ml  Net -383 ml    There were no vitals filed for this visit.  General appearance: Awake alert.  In no distress Resp: Clear to auscultation bilaterally.  Normal effort Cardio: S1-S2 is normal regular.  No S3-S4.  No rubs murmurs or bruit GI: Abdomen is soft.  Nontender nondistended.  Bowel sounds are present normal.  No masses organomegaly Extremities: No edema.  Full range of motion of lower extremities. Neurologic: Left-sided facial droop.  No obvious motor strength deficits noted.   Lab Results:  Data Reviewed: I have personally reviewed following labs and imaging studies  CBC: Recent Labs  Lab 08/10/21 1556 08/10/21 1621 08/12/21 0222 08/13/21 0317  WBC  12.6*  --  17.9* 11.2*  NEUTROABS 10.3*  --   --   --   HGB 14.3 15.0 15.2 13.2  HCT 41.0 44.0 42.9 38.8*  MCV 95.6  --  94.7 97.5  PLT 356  --  319 310     Basic Metabolic Panel: Recent Labs  Lab 08/10/21 1556 08/10/21 1621 08/12/21 0222 08/13/21 0317  NA 134* 135 133* 137  K 3.3* 3.3* 2.9* 3.6  CL 98 98 98 106  CO2 25  --  24 23  GLUCOSE 120* 120* 128* 103*  BUN 18 24* 22 29*  CREATININE 0.94 0.90 0.91 0.87  CALCIUM 9.0  --  8.4* 8.2*  MG  --   --  2.0  --      GFR: CrCl cannot be calculated (Unknown ideal weight.).  Liver Function Tests: Recent Labs  Lab 08/10/21 1556 08/12/21 0222 08/13/21 0317  AST 58* 38 25  ALT 26 25 21   ALKPHOS 97 88 66  BILITOT 0.7 1.0 0.8  PROT 6.6 6.2* 5.7*  ALBUMIN 3.5 3.0* 2.8*       Coagulation Profile: Recent Labs  Lab 08/10/21 1556  INR 1.0     Cardiac Enzymes: Recent Labs  Lab 08/10/21 2000 08/11/21 0339 08/13/21 0317  CKTOTAL 1,185* 940* 128  Recent Results (from the past 240 hour(s))  Resp Panel by RT-PCR (Flu A&B, Covid) Nasopharyngeal Swab     Status: None   Collection Time: 08/10/21  3:56 PM   Specimen: Nasopharyngeal Swab; Nasopharyngeal(NP) swabs in vial transport medium  Result Value Ref Range Status   SARS Coronavirus 2 by RT PCR NEGATIVE NEGATIVE Final    Comment: (NOTE) SARS-CoV-2 target nucleic acids are NOT DETECTED.  The SARS-CoV-2 RNA is generally detectable in upper respiratory specimens during the acute phase of infection. The lowest concentration of SARS-CoV-2 viral copies this assay can detect is 138 copies/mL. A negative result does not preclude SARS-Cov-2 infection and should not be used as the sole basis for treatment or other patient management decisions. A negative result may occur with  improper specimen collection/handling, submission of specimen other than nasopharyngeal swab, presence of viral mutation(s) within the areas targeted by this assay, and inadequate number  of viral copies(<138 copies/mL). A negative result must be combined with clinical observations, patient history, and epidemiological information. The expected result is Negative.  Fact Sheet for Patients:  EntrepreneurPulse.com.au  Fact Sheet for Healthcare Providers:  IncredibleEmployment.be  This test is no t yet approved or cleared by the [place] FDA and  has been authorized for detection and/or diagnosis of SARS-CoV-2 by FDA under an Emergency Use Authorization (EUA). This EUA will remain  in effect (meaning this test can be used) for the duration of the COVID-19 declaration under Section 564(b)(1) of the Act, 21 U.S.C.section 360bbb-3(b)(1), unless the authorization is terminated  or revoked sooner.       Influenza A by PCR NEGATIVE NEGATIVE Final   Influenza B by PCR NEGATIVE NEGATIVE Final    Comment: (NOTE) The Xpert Xpress SARS-CoV-2/FLU/RSV plus assay is intended as an aid in the diagnosis of influenza from Nasopharyngeal swab specimens and should not be used as a sole basis for treatment. Nasal washings and aspirates are unacceptable for Xpert Xpress SARS-CoV-2/FLU/RSV testing.  Fact Sheet for Patients: EntrepreneurPulse.com.au  Fact Sheet for Healthcare Providers: IncredibleEmployment.be  This test is not yet approved or cleared by the [place] FDA and has been authorized for detection and/or diagnosis of SARS-CoV-2 by FDA under an Emergency Use Authorization (EUA). This EUA will remain in effect (meaning this test can be used) for the duration of the COVID-19 declaration under Section 564(b)(1) of the Act, 21 U.S.C. section 360bbb-3(b)(1), unless the authorization is terminated or revoked.  Performed at [facility], [place] [address], [place], [place] [address]        Radiology Studies: DG Swallowing Func-Speech Pathology  Result Date: 08/14/2021 Table formatting  from the original result was not included. Objective Swallowing Evaluation: Type of Study: MBS-Modified Barium Swallow Study  Patient Details Name: Glen Barnett MRN: [id number] Date of Birth: 21-Sep-1927 Today's Date: 08/14/2021 Time: SLP Start Time (ACUTE ONLY): 0930 -SLP Stop Time (ACUTE ONLY): 3244 SLP Time Calculation (min) (ACUTE ONLY): 20 min Past Medical History: Past Medical History: Diagnosis Date  Arthritis   Ataxia 2014/15  brain MRI 02/2014 showed encephalomalacia parietal and occip lobes c/w old infarcts, plus diffuse cerebral atrophy, o/w normal  Cataract   Chronic renal insufficiency, stage 2 (mild) 12/2016  GFR 60s-70s  DDD (degenerative disc disease)   L-spine  Dermatitis   ?Contact derm? per [facility] D[facility] MD: Dr. Threasa Alpha (05/09/16): punch bx 06/18/16: chronic eczematous derm/contact derm, neg fungal stain.  Allergy (RAST) testing NEG.  GERD (gastroesophageal reflux disease)   Heme positive  stool 02/2012  colonoscopy normal.  EGD showed small hiatal hernia with associated Cameron's erosions.  Hemoccults NEG x 3 09/2014. Hem + iron def 07/2019->conservative mgmt.  Hyperlipidemia   Hypertension   Insomnia   poor sleep hygiene.  Clonidine for his HTN has helped a little.  Iron deficiency anemia 02/2012; 07/2019  2013 Mild iron deficiency->small upper GI erosions, colonoscopy normal.  07/2019->Hb 7.5, heme+, GI eval->pt chose no endoscopy->IV iron and Hb monitoring, PO iron indefinitely: Hb/iron normal on recheck 10/2019 and 12/2019.  Macular degeneration, age related   exudative R eye; nonexudative L eye.  Vitreous degeneration bilat.  Obesity (BMI 30-39.9)   Retinal hemorrhage 2016  w/ retinal detachment (right eye) : anticoagulants stopped due to this  Venous insufficiency of both lower extremities  Past Surgical History: Past Surgical History: Procedure Laterality Date  APPENDECTOMY  1935  cataract surg    Bilateral/ 08-13  COLONOSCOPY  05/12/12  for iron def anemia--NORMAL RESULT   ESOPHAGOGASTRODUODENOSCOPY  06/17/12  Small hiatal hernia with associated Cameron's erosions--likely source of his mild iron def anemia.  NASAL SINUS SURGERY    1998  TONSILLECTOMY  1935 HPI: Pt is a 87 y.o. male who presented to the ED after sustaining a fall at home. He was found to have slurred speech and aphasia by his family. MRI showed right MCA affecting frontal and parietal regions. Failed [facility] Swallow Screen 08/10/21. PMH: ambulatory dysfunction uses a cane to assist with ambulation at baseline, hypertension GERD, vitamin D deficiency.  No data recorded  Recommendations for follow up therapy are one component of a multi-disciplinary discharge planning process, led by the attending physician.  Recommendations may be updated based on patient status, additional functional criteria and insurance authorization. Assessment / Plan / Recommendation Clinical Impressions 08/14/2021 Clinical Impression Pt demonstrates a mild to moderate oral dysphagia due to sensory and motor impairment of the left tongue and cheek. Pt have left anterior spillage and mild left buccal residue with thin and moderate with solids that he cleared with verbal cues and a liquid wash. Pt additionally had good airway protection and strength with single sips, but there were two or three instances of late laryngeal elevation with consecutive cup/straw sips resulting in silent trace penetration and silent trace flash aspiration. With supervision and assist for single sips and clearance of left side of mouth pt could potentially tolerate a dys 2/thin liquid diet. Will upgrade and monitor for tolerance with meals in particular. SLP Visit Diagnosis Dysphagia, oropharyngeal phase (R13.12) Attention and concentration deficit following -- Frontal lobe and executive function deficit following -- Impact on safety and function Mild aspiration risk   Treatment Recommendations 08/14/2021 Treatment Recommendations Therapy as outlined in treatment plan below    Prognosis 08/14/2021 Prognosis for Safe Diet Advancement Good Barriers to Reach Goals -- Barriers/Prognosis Comment -- Diet Recommendations 08/14/2021 SLP Diet Recommendations Dysphagia 2 (Fine chop) solids;Thin liquid Liquid Administration via Cup;Straw Medication Administration Whole meds with liquid Compensations Monitor for anterior loss;Lingual sweep for clearance of pocketing;Small sips/bites;Slow rate;Minimize environmental distractions;Follow solids with liquid Postural Changes --   Other Recommendations 08/14/2021 Recommended Consults -- Oral Care Recommendations Oral care BID Other Recommendations Have oral suction available Follow Up Recommendations Acute inpatient rehab (3hours/day) Assistance recommended at discharge Frequent or constant Supervision/Assistance Functional Status Assessment Patient has had a recent decline in their functional status and demonstrates the ability to make significant improvements in function in a reasonable and predictable amount of time. Frequency and Duration  08/14/2021 Speech Therapy Frequency (  ACUTE ONLY) min 2x/week Treatment Duration 2 weeks   Oral Phase 08/14/2021 Oral Phase Impaired Oral - Pudding Teaspoon -- Oral - Pudding Cup -- Oral - Honey Teaspoon -- Oral - Honey Cup -- Oral - Nectar Teaspoon -- Oral - Nectar Cup -- Oral - Nectar Straw -- Oral - Thin Teaspoon -- Oral - Thin Cup Left anterior bolus loss Oral - Thin Straw Left anterior bolus loss Oral - Puree Left anterior bolus loss;Left pocketing in lateral sulci Oral - Mech Soft -- Oral - Regular Left anterior bolus loss;Left pocketing in lateral sulci;Delayed oral transit;Reduced posterior propulsion Oral - Multi-Consistency -- Oral - Pill Delayed oral transit Oral Phase - Comment --  Pharyngeal Phase 08/14/2021 Pharyngeal Phase Impaired Pharyngeal- Pudding Teaspoon -- Pharyngeal -- Pharyngeal- Pudding Cup -- Pharyngeal -- Pharyngeal- Honey Teaspoon -- Pharyngeal -- Pharyngeal- Honey Cup -- Pharyngeal --  Pharyngeal- Nectar Teaspoon -- Pharyngeal -- Pharyngeal- Nectar Cup -- Pharyngeal -- Pharyngeal- Nectar Straw -- Pharyngeal -- Pharyngeal- Thin Teaspoon -- Pharyngeal -- Pharyngeal- Thin Cup Penetration/Aspiration before swallow;Trace aspiration Pharyngeal Material enters airway, passes BELOW cords then ejected out Pharyngeal- Thin Straw Penetration/Aspiration before swallow Pharyngeal Material enters airway, remains ABOVE vocal cords and not ejected out Pharyngeal- Puree WFL Pharyngeal -- Pharyngeal- Mechanical Soft -- Pharyngeal -- Pharyngeal- Regular WFL Pharyngeal -- Pharyngeal- Multi-consistency -- Pharyngeal -- Pharyngeal- Pill -- Pharyngeal -- Pharyngeal Comment --  No flowsheet data found. Herbie Baltimore, MA CCC-SLP Acute Rehabilitation Services Office [phone] Lynann Beaver 08/14/2021, 11:02 AM                     VAS [place] LOWER EXTREMITY VENOUS (DVT)  Result Date: 08/13/2021  Lower Venous DVT Study Patient Name:  ROMY MCGUE  Date of Exam:   08/13/2021 Medical Rec #: [id number]               Accession #:    [id number] Date of Birth: [birth year]               Patient Gender: M Patient Age:   64 years Exam Location:  [facility] Procedure:      VAS [place] LOWER EXTREMITY VENOUS (DVT) Referring Phys: Cornelius Moras XU --------------------------------------------------------------------------------  Indications: Stroke.  Comparison Study: No prior study on file Performing Technologist: Sharion Dove RVS  Examination Guidelines: A complete evaluation includes B-mode imaging, spectral Doppler, color Doppler, and power Doppler as needed of all accessible portions of each vessel. Bilateral testing is considered an integral part of a complete examination. Limited examinations for reoccurring indications may be performed as noted. The reflux portion of the exam is performed with the patient in reverse Trendelenburg.  +---------+---------------+---------+-----------+----------+--------------+  RIGHT    CompressibilityPhasicitySpontaneityPropertiesThrombus Aging +---------+---------------+---------+-----------+----------+--------------+ CFV      Full           Yes      Yes                                 +---------+---------------+---------+-----------+----------+--------------+ SFJ      Full                                                        +---------+---------------+---------+-----------+----------+--------------+ FV Prox  Full                                                        +---------+---------------+---------+-----------+----------+--------------+  FV Mid   Full                                                        +---------+---------------+---------+-----------+----------+--------------+ FV DistalFull                                                        +---------+---------------+---------+-----------+----------+--------------+ PFV      Full                                                        +---------+---------------+---------+-----------+----------+--------------+ POP      Full           Yes      Yes                                 +---------+---------------+---------+-----------+----------+--------------+ PTV      Full                                                        +---------+---------------+---------+-----------+----------+--------------+ PERO     Full                                                        +---------+---------------+---------+-----------+----------+--------------+   +---------+---------------+---------+-----------+----------+--------------+ LEFT     CompressibilityPhasicitySpontaneityPropertiesThrombus Aging +---------+---------------+---------+-----------+----------+--------------+ CFV      Full           Yes      Yes                                 +---------+---------------+---------+-----------+----------+--------------+ SFJ      Full                                                         +---------+---------------+---------+-----------+----------+--------------+ FV Prox  Full                                                        +---------+---------------+---------+-----------+----------+--------------+ FV Mid   Full                                                        +---------+---------------+---------+-----------+----------+--------------+  FV DistalFull                                                        +---------+---------------+---------+-----------+----------+--------------+ PFV      Full                                                        +---------+---------------+---------+-----------+----------+--------------+ POP      Full           Yes      Yes                                 +---------+---------------+---------+-----------+----------+--------------+ PTV      Full                                                        +---------+---------------+---------+-----------+----------+--------------+ PERO     Full                                                        +---------+---------------+---------+-----------+----------+--------------+     Summary: BILATERAL: - No evidence of deep vein thrombosis seen in the lower extremities, bilaterally. -No evidence of popliteal cyst, bilaterally.   *See table(s) above for measurements and observations. Electronically signed by Monica Martinez MD on 08/13/2021 at 12:04:06 PM.    Final        LOS: 3 days   [facility] Pager on [email or web address]  08/14/2021, 12:35 PM

## 2021-08-14 NOTE — Progress Notes (Signed)
Occupational Therapy Treatment Patient Details Name: Glen Barnett MRN: [id number] DOB: March 02, 1927 Today's Date: 08/14/2021   History of present illness 87 y.o. male presenting to the emergency department after sustaining a fall at home. MRI showed right MCA territory infarcts. PMH of ambulatory dysfunction uses a cane to assist with ambulation at baseline, hypertension GERD, vitamin D deficiency   OT comments  Pt progressing well towards acute OT goals. Pt completed functional ambulation with RW and toilet transfer with min A for verbal cues, pt had poor management of RW and poor problem solving. He required mod A for hygiene in standing, with little awareness that he was not being thorough after BM. Pt also required min/mod cues for grooming to locate items and sequence the task. Pt verbalized blurry vision with reading; clock draw task and line bi section tasks were complete with results listed below. Pt continues to benefit from OT acutely. D/c recommendation remains appropriate.    Recommendations for follow up therapy are one component of a multi-disciplinary discharge planning process, led by the attending physician.  Recommendations may be updated based on patient status, additional functional criteria and insurance authorization.    Follow Up Recommendations  Acute inpatient rehab (3hours/day)    Assistance Recommended at Discharge Frequent or constant Supervision/Assistance  Equipment Recommendations  Other (comment)       Precautions / Restrictions Precautions Precautions: Fall Restrictions Weight Bearing Restrictions: No       Mobility Bed Mobility Overal bed mobility: Needs Assistance Bed Mobility: Supine to Sit;Sit to Supine     Supine to sit: Min guard Sit to supine: Min assist   General bed mobility comments: pt OOB upon arrival    Transfers Overall transfer level: Needs assistance Equipment used: Rolling walker (2 wheels) Transfers: Sit to/from  Stand Sit to Stand: Min guard           General transfer comment: 2x for sit<>stand from toilet, no physical assist required     Balance Overall balance assessment: Needs assistance Sitting-balance support: No upper extremity supported;Feet supported Sitting balance-Leahy Scale: Good     Standing balance support: No upper extremity supported;During functional activity Standing balance-Leahy Scale: Fair Standing balance comment: Pt worried about L knee buckling while standing and ambulating, no overt buckling during session                           ADL either performed or assessed with clinical judgement   ADL Overall ADL's : Needs assistance/impaired     Grooming: Minimal assistance;Wash/dry hands;Standing Grooming Details (indicate cue type and reason): min A for verbal cues fro sequencing task and locating items                 Toilet Transfer: Minimal assistance Toilet Transfer Details (indicate cue type and reason): verbal cues for sequencing and safety Toileting- Clothing Manipulation and Hygiene: Moderate assistance;Sit to/from stand Toileting - Clothing Manipulation Details (indicate cue type and reason): pt able to reach bottom in sitting, required moderate assist in standing for thoroughness     Functional mobility during ADLs: Min guard;Rolling walker (2 wheels) General ADL Comments: pt with BM this session, limited throghout due to poor safety awareness and required min/mod verbal cues for all tasks    Extremity/Trunk Assessment Upper Extremity Assessment Upper Extremity Assessment: Generalized weakness   Lower Extremity Assessment Lower Extremity Assessment: Defer to PT evaluation        Vision   Vision Assessment?:  Vision impaired- to be further tested in functional context Additional Comments: line bi section test completed with 100% accuracy. pt with complaints of "blurry" vision when reading his magazine after a few minutes    Perception Perception Perception: Impaired (Complete clock draw assessment with pt, his 1-6 numbers were accurate and had poor placement of 7-12 wtih 12 being outside of the circle. For the time "10 past 11," pt drew 10:00.)   Praxis      Cognition Arousal/Alertness: Awake/alert Behavior During Therapy: WFL for tasks assessed/performed Overall Cognitive Status: No family/caregiver present to determine baseline cognitive functioning Area of Impairment: Attention;Following commands;Safety/judgement;Awareness;Problem solving                   Current Attention Level: Sustained   Following Commands: Follows one step commands inconsistently;Follows one step commands with increased time Safety/Judgement: Decreased awareness of safety;Decreased awareness of deficits Awareness: Intellectual Problem Solving: Slow processing;Difficulty sequencing;Requires verbal cues;Requires tactile cues General Comments: verbal cues for sequencing and problem solving all tasks, follows one step commands well but needs incrased cues fro multistep commands.          Exercises Exercises: Other exercises Other Exercises Other Exercises: Supine: ankle pumps x5, heel slides x5 Other Exercises: Standing: Marching x5 Other Exercises: STS x5   Shoulder Instructions       General Comments VSS on RA    Pertinent Vitals/ Pain       Pain Assessment: No/denies pain   Frequency  Min 2X/week        Progress Toward Goals  OT Goals(current goals can now be found in the care plan section)  Progress towards OT goals: Progressing toward goals  Acute Rehab OT Goals Patient Stated Goal: take a break OT Goal Formulation: With patient Time For Goal Achievement: 08/25/21 Potential to Achieve Goals: Good ADL Goals Pt Will Perform Grooming: with modified independence;standing Pt Will Perform Lower Body Bathing: with modified independence;sitting/lateral leans;sit to/from stand Pt Will Perform Lower  Body Dressing: with modified independence;sitting/lateral leans;sit to/from stand Pt Will Transfer to Toilet: with modified independence;ambulating Pt Will Perform Toileting - Clothing Manipulation and hygiene: with modified independence;sitting/lateral leans;sit to/from stand  Plan Discharge plan remains appropriate       AM-PAC OT "6 Clicks" Daily Activity     Outcome Measure   Help from another person eating meals?: A Lot Help from another person taking care of personal grooming?: A Little Help from another person toileting, which includes using toliet, bedpan, or urinal?: A Lot Help from another person bathing (including washing, rinsing, drying)?: A Lot Help from another person to put on and taking off regular upper body clothing?: A Little Help from another person to put on and taking off regular lower body clothing?: A Lot 6 Click Score: 14    End of Session Equipment Utilized During Treatment: Gait belt;Rolling walker (2 wheels)  OT Visit Diagnosis: Unsteadiness on feet (R26.81);Other abnormalities of gait and mobility (R26.89);Muscle weakness (generalized) (M62.81)   Activity Tolerance Patient tolerated treatment well   Patient Left in chair;with call bell/phone within reach;with chair alarm set   Nurse Communication Mobility status (condom cath off)        Time: 3545-6256 OT Time Calculation (min): 22 min  Charges: OT General Charges $OT Visit: 1 Visit OT Treatments $Self Care/Home Management : 8-22 mins  Maruice Pieroni A Rhys Lichty 08/14/2021, 4:58 PM

## 2021-08-14 NOTE — Progress Notes (Signed)
Modified Barium Swallow Progress Note  Patient Details  Name: Glen Barnett MRN: [id number] Date of Birth: 1927/09/07  Today's Date: 08/14/2021  Modified Barium Swallow completed.  Full report located under Chart Review in the Imaging Section.  Brief recommendations include the following:  Clinical Impression  Pt demonstrates a mild to moderate oral dysphagia due to sensory and motor impairment of the left tongue and cheek. Pt have left anterior spillage and mild left buccal reisdue with thin and moderate with solids that he cleared with verbal cues and a liquid wash. Pt additionally had good airway protection and strength with single sips, but there were two or three instances of late laryngeal elevation with consecutive cup/straw sips resulting in silent trace penetration and silent trace flash aspiration. With supervision and assist for single sips and clearance of left side of mouth pt could potentially tolerate a dys 2/thin liquid diet. Will upgrade and monitor for tolerance with meals in particular.   Swallow Evaluation Recommendations       SLP Diet Recommendations: Dysphagia 2 (Fine chop) solids;Thin liquid   Liquid Administration via: Cup;Straw   Medication Administration: Whole meds with liquid   Supervision: Patient able to self feed   Compensations: Monitor for anterior loss;Lingual sweep for clearance of pocketing;Small sips/bites;Slow rate;Minimize environmental distractions;Follow solids with liquid       Oral Care Recommendations: Oral care BID   Other Recommendations: Have oral suction available    Luman Holway, Katherene Ponto 08/14/2021,11:00 AM

## 2021-08-14 NOTE — Progress Notes (Signed)
Inpatient Rehab Admissions Coordinator:   I do not have a bed for this Pt. On CIR today. I will continue to pursue for potential admit pending bed availability.   Clemens Catholic, [place], [place] Admissions Coordinator  [phone] ([place]) [phone] (office)

## 2021-08-14 NOTE — Progress Notes (Addendum)
Physical Therapy Treatment Patient Details Name: Glen Barnett MRN: [id number] DOB: 09-Oct-1926 Today's Date: 08/14/2021   History of Present Illness 87 y.o. male presenting to the emergency department after sustaining a fall at home. MRI showed right MCA territory infarcts. PMH of ambulatory dysfunction uses a cane to assist with ambulation at baseline, hypertension GERD, vitamin D deficiency    PT Comments    Pt received in supine and agreeable to therapy session with good participation. Pt tolerated all exercises well today with no c/o dizziness and was adamant he was "not tired" at the end of the session. Emphasis on safe proximity to RW, upright posture, and opening eyes for environmental scanning with heavy cues needed. RN notified pt requesting a bath. Pt remains a good candidate for AIR to address remaining deficits as he is motivated to return to prior level of function and making good progress toward PT goals. Pt continues to benefit from PT services to progress toward functional mobility goals.       Recommendations for follow up therapy are one component of a multi-disciplinary discharge planning process, led by the attending physician.  Recommendations may be updated based on patient status, additional functional criteria and insurance authorization.  Follow Up Recommendations  Acute inpatient rehab (3hours/day)     Assistance Recommended at Discharge Intermittent Supervision/Assistance (Physical assist with OOB mobility)  Equipment Recommendations  Other (comment) (TBD next venue of care)    Recommendations for Other Services       Precautions / Restrictions Precautions Precautions: Fall Restrictions Weight Bearing Restrictions: No     Mobility  Bed Mobility Overal bed mobility: Needs Assistance Bed Mobility: Supine to Sit;Sit to Supine     Supine to sit: Min guard Sit to supine: Min assist   General bed mobility comments: minG for supine to sit with  cues for safe hand placement, requires extra time; up to minA for sit to supine for BLE management    Transfers Overall transfer level: Needs assistance Equipment used: Rolling walker (2 wheels) Transfers: Sit to/from Stand Sit to Stand: Min assist   General transfer comment: Min assist for boost to stand from bed, cues for hand placement and upright posture    Ambulation/Gait Ambulation/Gait assistance: Min assist Gait Distance (Feet): 70 Feet (70 ft, standing rest break, 70 ft) Assistive device: Rolling walker (2 wheels) Gait Pattern/deviations: Step-through pattern;Decreased stride length;Wide base of support;Trunk flexed Gait velocity: Decreased     General Gait Details: Slow gait, trunk flexed with frequent cues for upright posture and proximity to RW. Pt requires VC to open eyes and determine surroundings. Pt tending to hold RW outside BOS when turning, needs cues throughout trial for safe AD use.   Modified Rankin (Stroke Patients Only) Modified Rankin (Stroke Patients Only) Pre-Morbid Rankin Score: Moderate disability Modified Rankin: Moderately severe disability     Balance Overall balance assessment: Needs assistance Sitting-balance support: No upper extremity supported;Feet supported Sitting balance-Leahy Scale: Good    Standing balance support: Bilateral upper extremity supported;During functional activity;Reliant on assistive device for balance Standing balance-Leahy Scale: Fair Standing balance comment: Pt worried about L knee buckling while standing and ambulating, no overt buckling during session       Cognition Arousal/Alertness: Awake/alert Behavior During Therapy: WFL for tasks assessed/performed Overall Cognitive Status: No family/caregiver present to determine baseline cognitive functioning Area of Impairment: Attention;Following commands;Safety/judgement;Awareness;Problem solving     Current Attention Level: Sustained   Following Commands: Follows  one step commands inconsistently;Follows one step commands  with increased time Safety/Judgement: Decreased awareness of safety;Decreased awareness of deficits Awareness: Intellectual Problem Solving: Slow processing;Difficulty sequencing;Requires verbal cues;Requires tactile cues General Comments: Pt requires mod verbal cuing for all tasks       Exercises Other Exercises Other Exercises: Supine: ankle pumps x5, heel slides x5 Other Exercises: Standing: Marching x5 Other Exercises: STS x5    General Comments General comments (skin integrity, edema, etc.): Pt stated "I'm not tired" when asked if he's a little tired, VSS on RA HR max 108 bpm with exertion.     Pertinent Vitals/Pain Pain Assessment: No/denies pain     PT Goals (current goals can now be found in the care plan section) Acute Rehab PT Goals Patient Stated Goal: Get well PT Goal Formulation: With patient/family Time For Goal Achievement: 08/25/21    Frequency    Min 4X/week      PT Plan Current plan remains appropriate       AM-PAC PT "6 Clicks" Mobility   Outcome Measure  Help needed turning from your back to your side while in a flat bed without using bedrails?: A Little Help needed moving from lying on your back to sitting on the side of a flat bed without using bedrails?: A Little Help needed moving to and from a bed to a chair (including a wheelchair)?: A Little Help needed standing up from a chair using your arms (e.g., wheelchair or bedside chair)?: A Little Help needed to walk in hospital room?: A Little Help needed climbing 3-5 steps with a railing? : A Lot 6 Click Score: 17    End of Session Equipment Utilized During Treatment: Gait belt Activity Tolerance: Patient tolerated treatment well Patient left: in bed;with call bell/phone within reach;with bed alarm set Nurse Communication: Mobility status;Other (comment) (Pt requesting a bath, states he hasn't had one this week) PT Visit Diagnosis:  Other abnormalities of gait and mobility (R26.89);Unsteadiness on feet (R26.81);History of falling (Z91.81);Difficulty in walking, not elsewhere classified (R26.2);Other symptoms and signs involving the nervous system (R29.898)     Time: 2725-3664 PT Time Calculation (min) (ACUTE ONLY): 25 min  Charges:  $Gait Training: 8-22 mins $Therapeutic Activity: 8-22 mins                     Evelene Croon, Student PTA CI: Carly P., PTA  Carly M Poff 08/14/2021, 3:08 PM

## 2021-08-14 NOTE — TOC Initial Note (Signed)
Transition of Care [facility]) - Initial/Assessment Note    Patient Details  Name: Rowyn Spilde MRN: [id number] Date of Birth: 1927/04/01  [facility]) CM/SW Contact:    Pollie Friar, RN Phone Number: 08/14/2021, 2:14 PM  Clinical Narrative:                 Patient is from home with family. Recommendations are for IR. [facility] has no available beds today and are not sure if able to offer a bed tomorrow. CM has updated the patients son and he and patient are agreeable to having [facility] inpatient rehab review his information. CM has faxed information to [facility] and talked with Pam at [facility] and they do have available beds.  TOC following.  Expected Discharge Plan: IP Rehab Facility Barriers to Discharge: Continued Medical Work up   Patient Goals and CMS Choice   CMS Medicare.gov Compare Post Acute Care list provided to:: Patient Choice offered to / list presented to : Patient, Adult Children  Expected Discharge Plan and Services Expected Discharge Plan: [place]   Discharge Planning Services: CM Consult Post Acute Care Choice: IP Rehab Living arrangements for the past 2 months: Single Family Home                                      Prior Living Arrangements/Services Living arrangements for the past 2 months: Single Family Home Lives with:: Adult Children Patient language and need for interpreter reviewed:: Yes Do you feel safe going back to the place where you live?: Yes      Need for Family Participation in Patient Care: Yes (Comment) Care giver support system in place?: Yes (comment)   Criminal Activity/Legal Involvement Pertinent to Current Situation/Hospitalization: No - Comment as needed  Activities of Daily Living      Permission Sought/Granted                  Emotional Assessment Appearance:: Appears stated age Attitude/Demeanor/Rapport: Engaged   Orientation: : Oriented to Self, Oriented to Place, Oriented to  Time,  Oriented to Situation   Psych Involvement: No (comment)  Admission diagnosis:  Pain [R52] Acute ischemic stroke ([place]) [I63.9] Stroke-like symptoms [R29.90] Cough in adult [R05.9] Acute CVA (cerebrovascular accident) [facility]) [I63.9] Patient Active Problem List   Diagnosis Date Noted   Acute CVA (cerebrovascular accident) ([place]) 08/11/2021   Stroke-like symptoms 08/10/2021   Heme positive stool 07/21/2019   Ataxia 03/10/2014   History of iron deficiency anemia 03/10/2014   HTN (hypertension), benign 03/10/2014   Vasomotor rhinitis 06/07/2013   Insomnia 06/07/2013   Age-related physical debility 06/07/2013   GERD (gastroesophageal reflux disease) 11/14/2012   Skin lesion 10/08/2012   Iron deficiency anemia 08/11/2012   Chronic knee instability 09/12/2011   Health maintenance examination 03/12/2011   HYPERLIPIDEMIA 10/02/2010   ESSENTIAL HYPERTENSION, BENIGN 10/02/2010   PCP:  Tammi Sou, MD Pharmacy:   [id number] - [place], [address] [place] [address] Phone: [phone] Fax: [phone]     Social Determinants of Health (SDOH) Interventions    Readmission Risk Interventions No flowsheet data found.

## 2021-08-14 NOTE — Care Management Important Message (Signed)
Important Message  Patient Details  Name: Glen Barnett MRN: [id number] Date of Birth: [birth year]   Medicare Important Message Given:  Yes     Orbie Pyo 08/14/2021, 2:19 PM

## 2021-08-14 NOTE — Progress Notes (Addendum)
Inpatient Rehab Admissions Coordinator:   Pt. Accepted to [facility]. CIR will sign off.   Clemens Catholic, [place], [place] Admissions Coordinator  [phone] ([place]) [phone] (office)

## 2021-08-15 DIAGNOSIS — I639 Cerebral infarction, unspecified: Secondary | ICD-10-CM | POA: Diagnosis not present

## 2021-08-15 LAB — BASIC METABOLIC PANEL
Anion gap: 7 (ref 5–15)
BUN: 20 mg/dL (ref 8–23)
CO2: 24 mmol/L (ref 22–32)
Calcium: 8.2 mg/dL — ABNORMAL LOW (ref 8.9–10.3)
Chloride: 102 mmol/L (ref 98–111)
Creatinine, Ser: 0.7 mg/dL (ref 0.61–1.24)
GFR, Estimated: >60 mL/min (ref >60)
Glucose, Bld: 110 mg/dL — ABNORMAL HIGH (ref 70–99)
Potassium: 3.9 mmol/L (ref 3.5–5.1)
Sodium: 133 mmol/L — ABNORMAL LOW (ref 135–145)

## 2021-08-15 LAB — CBC
HCT: 39.2 % (ref 39.0–52.0)
Hemoglobin: 13.1 g/dL (ref 13.0–17.0)
MCH: 32.6 pg (ref 26.0–34.0)
MCHC: 33.4 g/dL (ref 30.0–36.0)
MCV: 97.5 fL (ref 80.0–100.0)
Platelets: 304 10*3/uL (ref 150–400)
RBC: 4.02 MIL/uL — ABNORMAL LOW (ref 4.22–5.81)
RDW: 14.2 % (ref 11.5–15.5)
WBC: 8.7 10*3/uL (ref 4.0–10.5)
nRBC: 0 % (ref 0.0–0.2)

## 2021-08-15 LAB — RESP PANEL BY RT-PCR (FLU A&B, COVID) ARPGX2
Influenza A by PCR: NEGATIVE
Influenza B by PCR: NEGATIVE
SARS Coronavirus 2 by RT PCR: NEGATIVE

## 2021-08-15 LAB — MAGNESIUM: Magnesium: 2 mg/dL (ref 1.7–2.4)

## 2021-08-15 NOTE — NC FL2 (Signed)
[place] MEDICAID FL2 LEVEL OF CARE SCREENING TOOL     IDENTIFICATION  Patient Name: Glen Barnett Birthdate: [birth year] Sex: male Admission Date (Current Location): 08/10/2021  [facility] and [place] Number:  Herbalist and Address:  [facility], [place] [address], [place], [place] [address]      Provider Number: [id number]  Attending Physician Name and Address:  Antonieta Pert, MD  Relative Name and Phone Number:       Current Level of Care: Hospital Recommended Level of Care: [place] Prior Approval Number:    Date Approved/Denied:   PASRR Number:    Discharge Plan: SNF    Current Diagnoses: Patient Active Problem List   Diagnosis Date Noted   Acute CVA (cerebrovascular accident) ([place]) 08/11/2021   Stroke-like symptoms 08/10/2021   Heme positive stool 07/21/2019   Ataxia 03/10/2014   History of iron deficiency anemia 03/10/2014   HTN (hypertension), benign 03/10/2014   Vasomotor rhinitis 06/07/2013   Insomnia 06/07/2013   Age-related physical debility 06/07/2013   GERD (gastroesophageal reflux disease) 11/14/2012   Skin lesion 10/08/2012   Iron deficiency anemia 08/11/2012   Chronic knee instability 09/12/2011   Health maintenance examination 03/12/2011   HYPERLIPIDEMIA 10/02/2010   ESSENTIAL HYPERTENSION, BENIGN 10/02/2010    Orientation RESPIRATION BLADDER Height & Weight     Self, Time, Situation, Place  Normal Incontinent Weight:   Height:     BEHAVIORAL SYMPTOMS/MOOD NEUROLOGICAL BOWEL NUTRITION STATUS      Continent Diet (dysphagia 2 with thin liquids)  AMBULATORY STATUS COMMUNICATION OF NEEDS Skin   Limited Assist Verbally Skin abrasions (Left leg)                       Personal Care Assistance Level of Assistance  Bathing, Feeding, Dressing Bathing Assistance: Limited assistance Feeding assistance: Independent Dressing Assistance: Limited assistance     Functional Limitations Info   Sight, Hearing, Speech Sight Info: Impaired Hearing Info: Adequate Speech Info: Impaired (dysarthria)    SPECIAL CARE FACTORS FREQUENCY  PT (By licensed PT), OT (By licensed OT), Speech therapy     PT Frequency: 5x/wk OT Frequency: 5x/wk     Speech Therapy Frequency: 5x/wk      Contractures Contractures Info: Not present    Additional Factors Info  Code Status, Allergies Code Status Info: Full Allergies Info: NKA           Current Medications (08/15/2021):  This is the current hospital active medication list Current Facility-Administered Medications  Medication Dose Route Frequency Provider Last Rate Last Admin   acetaminophen (TYLENOL) tablet 650 mg  650 mg Oral Q4H PRN Imagene Sheller S, DO       Or   acetaminophen (TYLENOL) 160 MG/5ML solution 650 mg  650 mg Per Tube Q4H PRN Imagene Sheller S, DO       Or   acetaminophen (TYLENOL) suppository 650 mg  650 mg Rectal Q4H PRN Imagene Sheller S, DO       aspirin EC tablet 81 mg  81 mg Oral Daily Imagene Sheller S, DO   81 mg at 08/14/21 0825   atorvastatin (LIPITOR) tablet 40 mg  40 mg Oral Daily Rosalin Hawking, MD   40 mg at 08/14/21 3154   cholecalciferol (VITAMIN D3) tablet 2,000 Units  2,000 Units Oral Daily Imagene Sheller S, DO   2,000 Units at 08/14/21 0825   cloNIDine (CATAPRES) tablet 0.1 mg  0.1 mg Oral BID  Reome, Craig Guess, RPH   0.1 mg at 08/14/21 2145   clopidogrel (PLAVIX) tablet 75 mg  75 mg Oral Daily Imagene Sheller S, DO   75 mg at 08/14/21 0825   enoxaparin (LOVENOX) injection 40 mg  40 mg Subcutaneous Q24H Imagene Sheller S, DO   40 mg at 08/14/21 0825   guaiFENesin (MUCINEX) 12 hr tablet 600 mg  600 mg Oral BID Bonnielee Haff, MD   600 mg at 08/14/21 2145   guaiFENesin (ROBITUSSIN) 100 MG/5ML liquid 5 mL  5 mL Oral Q4H PRN Bonnielee Haff, MD   5 mL at 08/12/21 1851   multivitamin (PROSIGHT) tablet 1 tablet  1 tablet Oral Daily Reome, Earle J, RPH   1 tablet at 08/14/21 0825   ondansetron (ZOFRAN) injection 4 mg  4 mg  Intravenous Q6H PRN Kristopher Oppenheim, DO       pantoprazole (PROTONIX) EC tablet 80 mg  80 mg Oral Daily Imagene Sheller S, DO   80 mg at 08/14/21 0825   senna-docusate (Senokot-S) tablet 1 tablet  1 tablet Oral QHS PRN Leslee Home, DO         Discharge Medications: Please see discharge summary for a list of discharge medications.  Relevant Imaging Results:  Relevant Lab Results:   Additional Information SS#: [id number]  Pollie Friar, RN

## 2021-08-15 NOTE — Progress Notes (Signed)
Speech Language Pathology Treatment: Dysphagia;Cognitive-Linquistic  Patient Details Name: Glen Barnett MRN: [id number] DOB: 07-Nov-1926 Today's Date: 08/15/2021 Time: 0930-0950 SLP Time Calculation (min) (ACUTE ONLY): 20 min  Assessment / Plan / Recommendation Clinical Impression  RN reports pt had coughing with intake overnight and today during meal. SLP observed pt self feeding sips of thin water from a straw with initial verbal cues provided to avoid consecutive sips and only take single sips, which pt carried over without reminders. No coughing occurred. Given pts oral dysphagia and need for liquid washes to clear oral residue, suspect increased difficulty with mixed consistencies and attention to precautions during a meal. Recommend pt consume nectar thick liquids with meals but be allowed sips of thin liquids in between meals when not eating. Hopefully this will improve pts coordination with swallowing. Pt was observed to have minimal aspiration and strong cough. Some coughing events could likely be tolerated so that pt can consume least restrictive diet and continue to enjoy food and drink as much as possible given advanced age. Will continue to follow closely to determine tolerance prior to transition to next level of care. Today in session directly addressed pt intellectual and emergent awareness of deficits with verbal and visual cueing with pt able to verbalize reasoning and some safety precautions. Recommend in patient rehabilitation.   HPI HPI: Pt is a 87 y.o. male who presented to the ED after sustaining a fall at home. He was found to have slurred speech and aphasia by his family. MRI showed right MCA affecting frontal and parietal regions. Failed [facility] Swallow Screen 08/10/21. PMH: ambulatory dysfunction uses a cane to assist with ambulation at baseline, hypertension GERD, vitamin D deficiency.      SLP Plan  Continue with current plan of care      Recommendations for  follow up therapy are one component of a multi-disciplinary discharge planning process, led by the attending physician.  Recommendations may be updated based on patient status, additional functional criteria and insurance authorization.    Recommendations  Diet recommendations: Dysphagia 2 (fine chop);Nectar-thick liquid;Thin liquid Liquids provided via: Cup;Straw Medication Administration: Whole meds with puree Supervision: Patient able to self feed                Follow Up Recommendations: Acute inpatient rehab (3hours/day) Assistance recommended at discharge: Frequent or constant Supervision/Assistance SLP Visit Diagnosis: Dysphagia, oropharyngeal phase (R13.12) Plan: Continue with current plan of care       GO                Ellijah Leffel, Katherene Ponto  08/15/2021, 12:02 PM

## 2021-08-15 NOTE — Progress Notes (Signed)
PROGRESS NOTE    Glen Barnett  KZS:[id number] DOB: [birth year] DOA: 08/10/2021 PCP: Tammi Sou, MD   Chief Complaint  Patient presents with   Fall  Brief Narrative/Hospital Course: Glen Barnett, 87 y.o. male with PMH of ambulatory dysfunction uses a cane to assist with ambulation at baseline, hypertension GERD, vitamin D deficiency presented to the emergency department after sustaining a fall at home.  Was also found to have slurred speech, aphasia by his family, left-sided facial droop and brought to the ED concern for acute stroke and patient was hospitalized seen by neurology. MRI showed right MCA territory infarcts, completed a stroke work-up with CT angio neck, echocardiogram lab with LDL 149 HbA1c 5.8 TSH 3.2 EEG no seizure-like activity, seen by PT OT advised CIR but family prefers to have a skilled nursing facility.  Neurology advised aspirin Plavix x3 weeks followed by aspirin alone. He had mild rhabdomyolysis with stable renal function, hypokalemia and leukocytosis-that improved.  Subjective: Seen and examined this morning.  No new complaints.  Resting comfortably. Overnight afebrile  Assessment & Plan:  Acute Right MCA territory infarcts:completed a stroke work-up with CT angio neck, echocardiogram- nl lvef,lab with LDL 149 HbA1c 5.8 TSH 3.2 EEG no seizure-like activity, seen by PT OT advised CIR but family prefers to have a skilled nursing facility.  Neurology advised aspirin Plavix x3 weeks followed by aspirin alone.  Started on Lipitor 40 mg  Essential hypertension: BP fairly stable although higher than diastolic this morning.  He is back on clonidine.  Diovan verapamil HCTZ on hold and resume in a day or 2  Mild rhabdomyolysis CK1 105 normalized subsequently with hydration. Hypokalemia resolved Leukocytosis likely reactive no fever.  Resolved Macular degeneration stable Vitamin D deficiency history continue cholecalciferol.   GERD continue PPI    DVT prophylaxis: enoxaparin (LOVENOX) injection 40 mg Start: 08/11/21 1000 Code Status:   Code Status: Full Code Family Communication: plan of care discussed with patient at bedside. Status is: Inpatient Remains inpatient appropriate because: AWAITING CIR Disposition: Currently is medically stable for discharge. Anticipated Disposition: CIR vs SNF-PT OT recommend SAR but son wants to pursue SNF.  Social worker working on placement.  Objective: Vitals last 24 hrs: Vitals:   08/14/21 2334 08/15/21 0000 08/15/21 0432 08/15/21 0802  BP: (!) 142/86  (!) 165/73 (!) 154/122  Pulse: 70   70  Resp: 18 20 16 14   Temp: (!) 97.5 F (36.4 C)  98.6 F (37 C) 99.4 F (37.4 C)  TempSrc: Oral  Oral Oral  SpO2: 96%  96% 97%   Weight change:   Intake/Output Summary (Last 24 hours) at 08/15/2021 1046 Last data filed at 08/15/2021 0641 Gross per 24 hour  Intake 594 ml  Output 825 ml  Net -231 ml   Net IO Since Admission: -40.45 mL [08/15/21 1046]   Physical Examination: General exam: Aa0x3, weakm, elderly,older than stated age. HEENT:Oral mucosa moist, Ear/Nose WNL grossly,dentition normal. Respiratory system: B/l diminished BS, no use of accessory muscle, non tender. Cardiovascular system: S1 & S2 +,No JVD. Gastrointestinal system: Abdomen soft, NT,ND, BS+. Nervous System:Alert, awake, moving extremities. Extremities: edema none, distal peripheral pulses palpable.  Skin: No rashes, no icterus. MSK: Normal muscle bulk, tone, power.  Medications reviewed:  Scheduled Meds:  aspirin EC  81 mg Oral Daily   atorvastatin  40 mg Oral Daily   cholecalciferol  2,000 Units Oral Daily   cloNIDine  0.1 mg Oral BID   clopidogrel  75  mg Oral Daily   enoxaparin (LOVENOX) injection  40 mg Subcutaneous Q24H   guaiFENesin  600 mg Oral BID   multivitamin  1 tablet Oral Daily   pantoprazole  80 mg Oral Daily   Continuous Infusions:  Diet Order             DIET DYS 2 Room service appropriate?  Yes with Assist; Fluid consistency: Nectar Thick  Diet effective now                          Weight change:   Wt Readings from Last 3 Encounters:  07/26/21 88.5 kg  04/26/21 88.4 kg  12/25/20 91.9 kg     Consultants:see note  Procedures:see note Antimicrobials: Anti-infectives (From admission, onward)    None      Culture/Microbiology No results found for: SDES, SPECREQUEST, CULT, REPTSTATUS  Other culture-see note  Unresulted Labs (From admission, onward)    None     Data Reviewed: I have personally reviewed following labs and imaging studies CBC: Recent Labs  Lab 08/10/21 1556 08/10/21 1621 08/12/21 0222 08/13/21 0317 08/15/21 0152  WBC 12.6*  --  17.9* 11.2* 8.7  NEUTROABS 10.3*  --   --   --   --   HGB 14.3 15.0 15.2 13.2 13.1  HCT 41.0 44.0 42.9 38.8* 39.2  MCV 95.6  --  94.7 97.5 97.5  PLT 356  --  319 310 973   Basic Metabolic Panel: Recent Labs  Lab 08/10/21 1556 08/10/21 1621 08/12/21 0222 08/13/21 0317 08/15/21 0152  NA 134* 135 133* 137 133*  K 3.3* 3.3* 2.9* 3.6 3.9  CL 98 98 98 106 102  CO2 25  --  24 23 24   GLUCOSE 120* 120* 128* 103* 110*  BUN 18 24* 22 29* 20  CREATININE 0.94 0.90 0.91 0.87 0.70  CALCIUM 9.0  --  8.4* 8.2* 8.2*  MG  --   --  2.0  --  2.0   GFR: CrCl cannot be calculated (Unknown ideal weight.). Liver Function Tests: Recent Labs  Lab 08/10/21 1556 08/12/21 0222 08/13/21 0317  AST 58* 38 25  ALT 26 25 21   ALKPHOS 97 88 66  BILITOT 0.7 1.0 0.8  PROT 6.6 6.2* 5.7*  ALBUMIN 3.5 3.0* 2.8*   No results for input(s): LIPASE, AMYLASE in the last 168 hours. No results for input(s): AMMONIA in the last 168 hours. Coagulation Profile: Recent Labs  Lab 08/10/21 1556  INR 1.0   Cardiac Enzymes: Recent Labs  Lab 08/10/21 2000 08/11/21 0339 08/13/21 0317  CKTOTAL 1,185* 940* 128   BNP (last 3 results) No results for input(s): PROBNP in the last 8760 hours. HbA1C: No results for input(s): HGBA1C  in the last 72 hours. CBG: No results for input(s): GLUCAP in the last 168 hours. Lipid Profile: No results for input(s): CHOL, HDL, LDLCALC, TRIG, CHOLHDL, LDLDIRECT in the last 72 hours. Thyroid Function Tests: No results for input(s): TSH, T4TOTAL, FREET4, T3FREE, THYROIDAB in the last 72 hours. Anemia Panel: No results for input(s): VITAMINB12, FOLATE, FERRITIN, TIBC, IRON, RETICCTPCT in the last 72 hours. Sepsis Labs: No results for input(s): PROCALCITON, LATICACIDVEN in the last 168 hours.  Recent Results (from the past 240 hour(s))  Resp Panel by RT-PCR (Flu A&B, Covid) Nasopharyngeal Swab     Status: None   Collection Time: 08/10/21  3:56 PM   Specimen: Nasopharyngeal Swab; Nasopharyngeal(NP) swabs in vial transport medium  Result Value  Ref Range Status   SARS Coronavirus 2 by RT PCR NEGATIVE NEGATIVE Final    Comment: (NOTE) SARS-CoV-2 target nucleic acids are NOT DETECTED.  The SARS-CoV-2 RNA is generally detectable in upper respiratory specimens during the acute phase of infection. The lowest concentration of SARS-CoV-2 viral copies this assay can detect is 138 copies/mL. A negative result does not preclude SARS-Cov-2 infection and should not be used as the sole basis for treatment or other patient management decisions. A negative result may occur with  improper specimen collection/handling, submission of specimen other than nasopharyngeal swab, presence of viral mutation(s) within the areas targeted by this assay, and inadequate number of viral copies(<138 copies/mL). A negative result must be combined with clinical observations, patient history, and epidemiological information. The expected result is Negative.  Fact Sheet for Patients:  EntrepreneurPulse.com.au  Fact Sheet for Healthcare Providers:  IncredibleEmployment.be  This test is no t yet approved or cleared by the [place] FDA and  has been authorized for detection  and/or diagnosis of SARS-CoV-2 by FDA under an Emergency Use Authorization (EUA). This EUA will remain  in effect (meaning this test can be used) for the duration of the COVID-19 declaration under Section 564(b)(1) of the Act, 21 U.S.C.section 360bbb-3(b)(1), unless the authorization is terminated  or revoked sooner.       Influenza A by PCR NEGATIVE NEGATIVE Final   Influenza B by PCR NEGATIVE NEGATIVE Final    Comment: (NOTE) The Xpert Xpress SARS-CoV-2/FLU/RSV plus assay is intended as an aid in the diagnosis of influenza from Nasopharyngeal swab specimens and should not be used as a sole basis for treatment. Nasal washings and aspirates are unacceptable for Xpert Xpress SARS-CoV-2/FLU/RSV testing.  Fact Sheet for Patients: EntrepreneurPulse.com.au  Fact Sheet for Healthcare Providers: IncredibleEmployment.be  This test is not yet approved or cleared by the [place] FDA and has been authorized for detection and/or diagnosis of SARS-CoV-2 by FDA under an Emergency Use Authorization (EUA). This EUA will remain in effect (meaning this test can be used) for the duration of the COVID-19 declaration under Section 564(b)(1) of the Act, 21 U.S.C. section 360bbb-3(b)(1), unless the authorization is terminated or revoked.  Performed at [facility], [place] [address] [place], [place] [address]   Resp Panel by RT-PCR (Flu A&B, Covid) Nasopharyngeal Swab     Status: None   Collection Time: 08/14/21  4:07 PM   Specimen: Nasopharyngeal Swab; Nasopharyngeal(NP) swabs in vial transport medium  Result Value Ref Range Status   SARS Coronavirus 2 by RT PCR NEGATIVE NEGATIVE Final    Comment: (NOTE) SARS-CoV-2 target nucleic acids are NOT DETECTED.  The SARS-CoV-2 RNA is generally detectable in upper respiratory specimens during the acute phase of infection. The lowest concentration of SARS-CoV-2 viral copies this assay can detect is 138  copies/mL. A negative result does not preclude SARS-Cov-2 infection and should not be used as the sole basis for treatment or other patient management decisions. A negative result may occur with  improper specimen collection/handling, submission of specimen other than nasopharyngeal swab, presence of viral mutation(s) within the areas targeted by this assay, and inadequate number of viral copies(<138 copies/mL). A negative result must be combined with clinical observations, patient history, and epidemiological information. The expected result is Negative.  Fact Sheet for Patients:  EntrepreneurPulse.com.au  Fact Sheet for Healthcare Providers:  IncredibleEmployment.be  This test is no t yet approved or cleared by the [place] FDA and  has been authorized for detection and/or  diagnosis of SARS-CoV-2 by FDA under an Emergency Use Authorization (EUA). This EUA will remain  in effect (meaning this test can be used) for the duration of the COVID-19 declaration under Section 564(b)(1) of the Act, 21 U.S.C.section 360bbb-3(b)(1), unless the authorization is terminated  or revoked sooner.       Influenza A by PCR NEGATIVE NEGATIVE Final   Influenza B by PCR NEGATIVE NEGATIVE Final    Comment: (NOTE) The Xpert Xpress SARS-CoV-2/FLU/RSV plus assay is intended as an aid in the diagnosis of influenza from Nasopharyngeal swab specimens and should not be used as a sole basis for treatment. Nasal washings and aspirates are unacceptable for Xpert Xpress SARS-CoV-2/FLU/RSV testing.  Fact Sheet for Patients: EntrepreneurPulse.com.au  Fact Sheet for Healthcare Providers: IncredibleEmployment.be  This test is not yet approved or cleared by the [place] FDA and has been authorized for detection and/or diagnosis of SARS-CoV-2 by FDA under an Emergency Use Authorization (EUA). This EUA will remain in effect (meaning  this test can be used) for the duration of the COVID-19 declaration under Section 564(b)(1) of the Act, 21 U.S.C. section 360bbb-3(b)(1), unless the authorization is terminated or revoked.  Performed at [facility], [place] [address], [place], [place] [address]      Radiology Studies: DG Swallowing Func-Speech Pathology  Result Date: 08/14/2021 Table formatting from the original result was not included. Objective Swallowing Evaluation: Type of Study: MBS-Modified Barium Swallow Study  Patient Details Name: Glen Barnett MRN: [id number] Date of Birth: Mar 22, 1927 Today's Date: 08/14/2021 Time: SLP Start Time (ACUTE ONLY): 0930 -SLP Stop Time (ACUTE ONLY): 9371 SLP Time Calculation (min) (ACUTE ONLY): 20 min Past Medical History: Past Medical History: Diagnosis Date  Arthritis   Ataxia 2014/15  brain MRI 02/2014 showed encephalomalacia parietal and occip lobes c/w old infarcts, plus diffuse cerebral atrophy, o/w normal  Cataract   Chronic renal insufficiency, stage 2 (mild) 12/2016  GFR 60s-70s  DDD (degenerative disc disease)   L-spine  Dermatitis   ?Contact derm? per [facility] MD: Dr. Threasa Alpha (05/09/16): punch bx 06/18/16: chronic eczematous derm/contact derm, neg fungal stain.  Allergy (RAST) testing NEG.  GERD (gastroesophageal reflux disease)   Heme positive stool 02/2012  colonoscopy normal.  EGD showed small hiatal hernia with associated Cameron's erosions.  Hemoccults NEG x 3 09/2014. Hem + iron def 07/2019->conservative mgmt.  Hyperlipidemia   Hypertension   Insomnia   poor sleep hygiene.  Clonidine for his HTN has helped a little.  Iron deficiency anemia 02/2012; 07/2019  2013 Mild iron deficiency->small upper GI erosions, colonoscopy normal.  07/2019->Hb 7.5, heme+, GI eval->pt chose no endoscopy->IV iron and Hb monitoring, PO iron indefinitely: Hb/iron normal on recheck 10/2019 and 12/2019.  Macular degeneration, age related   exudative R eye; nonexudative L eye.  Vitreous  degeneration bilat.  Obesity (BMI 30-39.9)   Retinal hemorrhage 2016  w/ retinal detachment (right eye) : anticoagulants stopped due to this  Venous insufficiency of both lower extremities  Past Surgical History: Past Surgical History: Procedure Laterality Date  APPENDECTOMY  1935  cataract surg    Bilateral/ 08-13  COLONOSCOPY  05/12/12  for iron def anemia--NORMAL RESULT  ESOPHAGOGASTRODUODENOSCOPY  06/17/12  Small hiatal hernia with associated Cameron's erosions--likely source of his mild iron def anemia.  NASAL SINUS SURGERY    1998  TONSILLECTOMY  1935 HPI: Pt is a 87 y.o. male who presented to the ED after sustaining a fall at home. He was found to have slurred  speech and aphasia by his family. MRI showed right MCA affecting frontal and parietal regions. Failed [facility] Swallow Screen 08/10/21. PMH: ambulatory dysfunction uses a cane to assist with ambulation at baseline, hypertension GERD, vitamin D deficiency.  No data recorded  Recommendations for follow up therapy are one component of a multi-disciplinary discharge planning process, led by the attending physician.  Recommendations may be updated based on patient status, additional functional criteria and insurance authorization. Assessment / Plan / Recommendation Clinical Impressions 08/14/2021 Clinical Impression Pt demonstrates a mild to moderate oral dysphagia due to sensory and motor impairment of the left tongue and cheek. Pt have left anterior spillage and mild left buccal residue with thin and moderate with solids that he cleared with verbal cues and a liquid wash. Pt additionally had good airway protection and strength with single sips, but there were two or three instances of late laryngeal elevation with consecutive cup/straw sips resulting in silent trace penetration and silent trace flash aspiration. With supervision and assist for single sips and clearance of left side of mouth pt could potentially tolerate a dys 2/thin liquid diet. Will upgrade and  monitor for tolerance with meals in particular. SLP Visit Diagnosis Dysphagia, oropharyngeal phase (R13.12) Attention and concentration deficit following -- Frontal lobe and executive function deficit following -- Impact on safety and function Mild aspiration risk   Treatment Recommendations 08/14/2021 Treatment Recommendations Therapy as outlined in treatment plan below   Prognosis 08/14/2021 Prognosis for Safe Diet Advancement Good Barriers to Reach Goals -- Barriers/Prognosis Comment -- Diet Recommendations 08/14/2021 SLP Diet Recommendations Dysphagia 2 (Fine chop) solids;Thin liquid Liquid Administration via Cup;Straw Medication Administration Whole meds with liquid Compensations Monitor for anterior loss;Lingual sweep for clearance of pocketing;Small sips/bites;Slow rate;Minimize environmental distractions;Follow solids with liquid Postural Changes --   Other Recommendations 08/14/2021 Recommended Consults -- Oral Care Recommendations Oral care BID Other Recommendations Have oral suction available Follow Up Recommendations Acute inpatient rehab (3hours/day) Assistance recommended at discharge Frequent or constant Supervision/Assistance Functional Status Assessment Patient has had a recent decline in their functional status and demonstrates the ability to make significant improvements in function in a reasonable and predictable amount of time. Frequency and Duration  08/14/2021 Speech Therapy Frequency (ACUTE ONLY) min 2x/week Treatment Duration 2 weeks   Oral Phase 08/14/2021 Oral Phase Impaired Oral - Pudding Teaspoon -- Oral - Pudding Cup -- Oral - Honey Teaspoon -- Oral - Honey Cup -- Oral - Nectar Teaspoon -- Oral - Nectar Cup -- Oral - Nectar Straw -- Oral - Thin Teaspoon -- Oral - Thin Cup Left anterior bolus loss Oral - Thin Straw Left anterior bolus loss Oral - Puree Left anterior bolus loss;Left pocketing in lateral sulci Oral - Mech Soft -- Oral - Regular Left anterior bolus loss;Left pocketing in  lateral sulci;Delayed oral transit;Reduced posterior propulsion Oral - Multi-Consistency -- Oral - Pill Delayed oral transit Oral Phase - Comment --  Pharyngeal Phase 08/14/2021 Pharyngeal Phase Impaired Pharyngeal- Pudding Teaspoon -- Pharyngeal -- Pharyngeal- Pudding Cup -- Pharyngeal -- Pharyngeal- Honey Teaspoon -- Pharyngeal -- Pharyngeal- Honey Cup -- Pharyngeal -- Pharyngeal- Nectar Teaspoon -- Pharyngeal -- Pharyngeal- Nectar Cup -- Pharyngeal -- Pharyngeal- Nectar Straw -- Pharyngeal -- Pharyngeal- Thin Teaspoon -- Pharyngeal -- Pharyngeal- Thin Cup Penetration/Aspiration before swallow;Trace aspiration Pharyngeal Material enters airway, passes BELOW cords then ejected out Pharyngeal- Thin Straw Penetration/Aspiration before swallow Pharyngeal Material enters airway, remains ABOVE vocal cords and not ejected out Pharyngeal- Puree WFL Pharyngeal -- Pharyngeal- Mechanical Soft -- Pharyngeal -- Pharyngeal-  Regular WFL Pharyngeal -- Pharyngeal- Multi-consistency -- Pharyngeal -- Pharyngeal- Pill -- Pharyngeal -- Pharyngeal Comment --  No flowsheet data found. Herbie Baltimore, MA CCC-SLP Acute Rehabilitation Services Office [phone] Lynann Beaver 08/14/2021, 11:02 AM                       LOS: 4 days   Antonieta Pert, MD [facility]  08/15/2021, 10:46 AM

## 2021-08-15 NOTE — TOC Progression Note (Signed)
Transition of Care [facility]) - Progression Note    Patient Details  Name: Glen Barnett MRN: [id number] Date of Birth: 03/24/1927  [facility]) CM/SW Contact  Pollie Friar, RN Phone Number: 08/15/2021, 3:00 PM  Clinical Narrative:    Son and patient have decided on [facility] SNF for rehab. CM has updated HPIR. Pt will have a bed at [facility] tomorrow. Current Covid test is acceptable per Estill Bamberg at [facility].  TOC following.   Expected Discharge Plan: IP Rehab Facility Barriers to Discharge: Continued Medical Work up  Expected Discharge Plan and Services Expected Discharge Plan: [place]   Discharge Planning Services: CM Consult Post Acute Care Choice: IP Rehab Living arrangements for the past 2 months: Single Family Home                                       Social Determinants of Health (SDOH) Interventions    Readmission Risk Interventions No flowsheet data found.

## 2021-08-15 NOTE — TOC Progression Note (Signed)
Transition of Care [facility]) - Progression Note    Patient Details  Name: Glen Barnett MRN: [id number] Date of Birth: Jan 14, 1927  [facility]) CM/SW Contact  Pollie Friar, RN Phone Number: 08/15/2021, 12:09 PM  Clinical Narrative:    [facility] inpatient rehab has offered the patient a bed. CM has updated the patients son, Glen Barnett. Plan is for d/c to [facility] tomorrow am. MD updated.  TOC following.   Expected Discharge Plan: IP Rehab Facility Barriers to Discharge: Continued Medical Work up  Expected Discharge Plan and Services Expected Discharge Plan: [place]   Discharge Planning Services: CM Consult Post Acute Care Choice: IP Rehab Living arrangements for the past 2 months: Single Family Home                                       Social Determinants of Health (SDOH) Interventions    Readmission Risk Interventions No flowsheet data found.

## 2021-08-15 NOTE — Progress Notes (Signed)
Physical Therapy Treatment Patient Details Name: Glen Barnett MRN: [id number] DOB: 10-Nov-1926 Today's Date: 08/15/2021   History of Present Illness 87 y.o. male presenting to the emergency department after sustaining a fall at home. MRI showed right MCA territory infarcts. PMH of ambulatory dysfunction uses a cane to assist with ambulation at baseline, hypertension, GERD, vitamin D deficiency    PT Comments    Pt received in supine, agreeable to therapy session with encouragement and with good tolerance for transfer and gait training as well as seated exercises in room. Pt needing up to minA and mod cues for safety/technique while navigating hallway and poor carryover of instructions when performing wayfinding task in hallway. Pt requesting wall clock for his room, unit secretary notified and a shower, RN notified. Pt continues to benefit from PT services to progress toward functional mobility goals.   Recommendations for follow up therapy are one component of a multi-disciplinary discharge planning process, led by the attending physician.  Recommendations may be updated based on patient status, additional functional criteria and insurance authorization.  Follow Up Recommendations  Acute inpatient rehab (3hours/day)     Assistance Recommended at Discharge Intermittent Supervision/Assistance (physical assist/supervision with standing tasks)  Equipment Recommendations  Other (comment) (TBD next venue of care)    Recommendations for Other Services       Precautions / Restrictions Precautions Precautions: Fall Precaution Comments: pt reports LLE buckles sometimes at home but not witnessed during session Restrictions Weight Bearing Restrictions: No     Mobility  Bed Mobility Overal bed mobility: Needs Assistance Bed Mobility: Supine to Sit     Supine to sit: Min guard     General bed mobility comments: increased time to perform, HOB elevated ~20 degrees and use of  rail.    Transfers Overall transfer level: Needs assistance Equipment used: Rolling walker (2 wheels) Transfers: Sit to/from Stand Sit to Stand: Min assist;Min guard           General transfer comment: from EOB>RW and RW<>chair, pt needing minA for controlled lowering due to decreased eccentric control to sit but min guard to rise from EOB    Ambulation/Gait Ambulation/Gait assistance: Min assist Gait Distance (Feet): 85 Feet (60ft, seated break, 20ft) Assistive device: Rolling walker (2 wheels) Gait Pattern/deviations: Step-through pattern;Decreased stride length;Wide base of support;Trunk flexed;Leaning posteriorly;Drifts right/left;Decreased dorsiflexion - right;Decreased dorsiflexion - left Gait velocity: Decreased Gait velocity interpretation: <1.31 ft/sec, indicative of household ambulator   General Gait Details: Slow gait, trunk flexed with frequent cues for upright posture and proximity to RW, pt tending to pick up RW from floor when turning needs reminder to keep it on floor. Pt given multiple small wayfinding tasks in hallway and unable to perform without hints/reminders, likely significant short term/working memory deficit.   Stairs             Wheelchair Mobility    Modified Rankin (Stroke Patients Only) Modified Rankin (Stroke Patients Only) Pre-Morbid Rankin Score: Moderate disability Modified Rankin: Moderately severe disability     Balance Overall balance assessment: Needs assistance Sitting-balance support: No upper extremity supported;Feet supported Sitting balance-Leahy Scale: Good     Standing balance support: No upper extremity supported;During functional activity Standing balance-Leahy Scale: Fair                              Cognition Arousal/Alertness: Awake/alert Behavior During Therapy: WFL for tasks assessed/performed Overall Cognitive Status: No family/caregiver present to determine  baseline cognitive functioning Area  of Impairment: Attention;Following commands;Safety/judgement;Awareness;Problem solving                   Current Attention Level: Sustained   Following Commands: Follows one step commands inconsistently;Follows one step commands with increased time Safety/Judgement: Decreased awareness of safety;Decreased awareness of deficits Awareness: Intellectual Problem Solving: Slow processing;Difficulty sequencing;Requires verbal cues;Requires tactile cues General Comments: verbal cues for sequencing and problem solving all tasks, pt with fair carryover of some things (reaching back prior to sitting) but poor carryover of wayfinding instructions within session and possibly some decrease R vision/awareness. Pt denies needing to wear glasses for ambulation.        Exercises Other Exercises Other Exercises: seated BLE AROM: ankle pumps, LAQ, hip flexion x10 reps ea Other Exercises: STS x3 for BLE strengthening Other Exercises: portion of gait billed as TE for BLE strengthening    General Comments General comments (skin integrity, edema, etc.): VSS on RA, HR to 106 bpm with exertion, HR 99 bpm seated EOB initially      Pertinent Vitals/Pain Pain Assessment: No/denies pain    Home Living                          Prior Function            PT Goals (current goals can now be found in the care plan section) Acute Rehab PT Goals Patient Stated Goal: to go to rehab PT Goal Formulation: With patient/family Time For Goal Achievement: 08/25/21 Progress towards PT goals: Progressing toward goals    Frequency    Min 4X/week      PT Plan Current plan remains appropriate    Co-evaluation              AM-PAC PT "6 Clicks" Mobility   Outcome Measure  Help needed turning from your back to your side while in a flat bed without using bedrails?: A Little Help needed moving from lying on your back to sitting on the side of a flat bed without using bedrails?: A Little Help  needed moving to and from a bed to a chair (including a wheelchair)?: A Lot (mod cues for this and all items below) Help needed standing up from a chair using your arms (e.g., wheelchair or bedside chair)?: A Lot Help needed to walk in hospital room?: A Lot Help needed climbing 3-5 steps with a railing? : A Lot 6 Click Score: 14    End of Session Equipment Utilized During Treatment: Gait belt Activity Tolerance: Patient tolerated treatment well Patient left: in chair;with call bell/phone within reach;with chair alarm set;Other (comment) (pt set up with tray table to eat his lunch in recliner) Nurse Communication: Mobility status;Other (comment) (pt requesting a bath and needs frequent supervision during meal) PT Visit Diagnosis: Other abnormalities of gait and mobility (R26.89);Unsteadiness on feet (R26.81);History of falling (Z91.81);Difficulty in walking, not elsewhere classified (R26.2);Other symptoms and signs involving the nervous system (R29.898)     Time: 9735-3299 PT Time Calculation (min) (ACUTE ONLY): 36 min  Charges:  $Gait Training: 8-22 mins $Therapeutic Exercise: 8-22 mins                     Glen Barnett P., PTA Acute Rehabilitation Services Pager: [phone] Office: [place] 08/15/2021, 2:01 PM

## 2021-08-16 DIAGNOSIS — N182 Chronic kidney disease, stage 2 (mild): Secondary | ICD-10-CM | POA: Diagnosis not present

## 2021-08-16 DIAGNOSIS — N189 Chronic kidney disease, unspecified: Secondary | ICD-10-CM | POA: Diagnosis not present

## 2021-08-16 DIAGNOSIS — E559 Vitamin D deficiency, unspecified: Secondary | ICD-10-CM | POA: Diagnosis not present

## 2021-08-16 DIAGNOSIS — I872 Venous insufficiency (chronic) (peripheral): Secondary | ICD-10-CM | POA: Diagnosis not present

## 2021-08-16 DIAGNOSIS — D649 Anemia, unspecified: Secondary | ICD-10-CM | POA: Diagnosis not present

## 2021-08-16 DIAGNOSIS — M259 Joint disorder, unspecified: Secondary | ICD-10-CM | POA: Diagnosis not present

## 2021-08-16 DIAGNOSIS — I69351 Hemiplegia and hemiparesis following cerebral infarction affecting right dominant side: Secondary | ICD-10-CM | POA: Diagnosis not present

## 2021-08-16 DIAGNOSIS — R279 Unspecified lack of coordination: Secondary | ICD-10-CM | POA: Diagnosis not present

## 2021-08-16 DIAGNOSIS — K219 Gastro-esophageal reflux disease without esophagitis: Secondary | ICD-10-CM | POA: Diagnosis not present

## 2021-08-16 DIAGNOSIS — I10 Essential (primary) hypertension: Secondary | ICD-10-CM | POA: Diagnosis not present

## 2021-08-16 DIAGNOSIS — Z743 Need for continuous supervision: Secondary | ICD-10-CM | POA: Diagnosis not present

## 2021-08-16 DIAGNOSIS — E785 Hyperlipidemia, unspecified: Secondary | ICD-10-CM | POA: Diagnosis not present

## 2021-08-16 DIAGNOSIS — H3530 Unspecified macular degeneration: Secondary | ICD-10-CM | POA: Diagnosis not present

## 2021-08-16 DIAGNOSIS — I639 Cerebral infarction, unspecified: Secondary | ICD-10-CM | POA: Diagnosis not present

## 2021-08-16 MED ORDER — ASPIRIN 81 MG PO TBEC: 81.0000 mg | DELAYED_RELEASE_TABLET | Freq: Every day | ORAL | 11 refills | Status: DC

## 2021-08-16 MED ORDER — ATORVASTATIN CALCIUM 40 MG PO TABS: 40.0000 mg | ORAL_TABLET | Freq: Every day | ORAL | Status: DC

## 2021-08-16 MED ORDER — CLOPIDOGREL BISULFATE 75 MG PO TABS: 75.0000 mg | ORAL_TABLET | Freq: Every day | ORAL | Status: AC

## 2021-08-16 NOTE — Progress Notes (Signed)
Speech Language Pathology Treatment: Dysphagia;Cognitive-Linquistic  Patient Details Name: Glen Barnett MRN: [id number] DOB: 08/26/1927 Today's Date: 08/16/2021 Time: 9741-6384 SLP Time Calculation (min) (ACUTE ONLY): 18 min  Assessment / Plan / Recommendation Clinical Impression  Mr. Glen Barnett awareness continues to improve. He is able to describe problem of pocketed food in left buccal cavity and his sensory loss. He is concerned and wants this to improve. Verbally reinforced compensatory strategies to sweep buccal cavity and use a nectar thick liquid wash with meals. He states he had some coughing, but no significant choking episodes with meal. He is also tolerating thin liquids in between meals with no coughing or wet vocal quality. His voice is no longer wet at baseline. He is concerned about his intelligibility and is noticing his communication breakdowns with staff. Encouraged  restating needs differently/explicitly and over articulation and slow rate, though to me he is 100% intelligible at conversation level. Pt to continue dys 2 (finely chopped diet) with nectar thick liquids with meals and thin liquids allowed between meals.   HPI HPI: Pt is a 87 y.o. male who presented to the ED after sustaining a fall at home. He was found to have slurred speech and aphasia by his family. MRI showed right MCA affecting frontal and parietal regions. Failed [facility] Swallow Screen 08/10/21. PMH: ambulatory dysfunction uses a cane to assist with ambulation at baseline, hypertension GERD, vitamin D deficiency.      SLP Plan  Continue with current plan of care      Recommendations for follow up therapy are one component of a multi-disciplinary discharge planning process, led by the attending physician.  Recommendations may be updated based on patient status, additional functional criteria and insurance authorization.    Recommendations  Diet recommendations: Dysphagia 2 (fine chop);Nectar-thick  liquid;Thin liquid Liquids provided via: Cup;Straw Medication Administration: Whole meds with puree Supervision: Patient able to self feed Compensations: Monitor for anterior loss;Lingual sweep for clearance of pocketing;Small sips/bites;Slow rate;Minimize environmental distractions;Follow solids with liquid Postural Changes and/or Swallow Maneuvers: Seated upright 90 degrees                Follow Up Recommendations: Skilled nursing-short term rehab (<3 hours/day) Assistance recommended at discharge: Frequent or constant Supervision/Assistance SLP Visit Diagnosis: Dysphagia, oropharyngeal phase (R13.12) Plan: Continue with current plan of care       GO                Harriett Azar, Katherene Ponto  08/16/2021, 10:24 AM

## 2021-08-16 NOTE — TOC Transition Note (Signed)
Transition of Care [facility]) - CM/SW Discharge Note   Patient Details  Name: Zakkery Dorian MRN: [id number] Date of Birth: [birth year]  [facility] CM/SW Contact:  Pollie Friar, RN Phone Number: 08/16/2021, 10:20 AM   Clinical Narrative:    Patient is discharging to [facility] today. Pt will transport via [facility]. Bedside RN updated and d/c packet is at the desk.   Room: 303-B Number for report: [phone]   Final next level of care: Skilled Nursing Facility Barriers to Discharge: No Barriers Identified   Patient Goals and CMS Choice   CMS Medicare.gov Compare Post Acute Care list provided to:: Patient Represenative (must comment) Choice offered to / list presented to : Adult Children  Discharge Placement PASRR number recieved: 08/15/21            Patient chooses bed at: [facility] Patient to be transferred to facility by: [place] Name of family member notified: John-son Patient and family notified of of transfer: 08/16/21  Discharge Plan and Services   Discharge Planning Services: CM Consult Post Acute Care Choice: IP Rehab                               Social Determinants of Health (SDOH) Interventions     Readmission Risk Interventions No flowsheet data found.

## 2021-08-16 NOTE — Plan of Care (Signed)
  Problem: Education: Goal: Knowledge of disease or condition will improve Outcome: Progressing Goal: Knowledge of secondary prevention will improve (SELECT ALL) Outcome: Progressing   Problem: Education: Goal: Knowledge of General Education information will improve Description: Including pain rating scale, medication(s)/side effects and non-pharmacologic comfort measures Outcome: Progressing   Problem: Health Behavior/Discharge Planning: Goal: Ability to manage health-related needs will improve Outcome: Progressing   Problem: Clinical Measurements: Goal: Ability to maintain clinical measurements within normal limits will improve Outcome: Progressing Goal: Will remain free from infection Outcome: Progressing Goal: Diagnostic test results will improve Outcome: Progressing Goal: Respiratory complications will improve Outcome: Progressing Goal: Cardiovascular complication will be avoided Outcome: Progressing   Problem: Activity: Goal: Risk for activity intolerance will decrease Outcome: Progressing   Problem: Nutrition: Goal: Adequate nutrition will be maintained Outcome: Progressing   Problem: Coping: Goal: Level of anxiety will decrease Outcome: Progressing   Problem: Elimination: Goal: Will not experience complications related to bowel motility Outcome: Progressing Goal: Will not experience complications related to urinary retention Outcome: Progressing   Problem: Pain Managment: Goal: General experience of comfort will improve Outcome: Progressing   Problem: Safety: Goal: Ability to remain free from injury will improve Outcome: Progressing   Problem: Skin Integrity: Goal: Risk for impaired skin integrity will decrease Outcome: Progressing

## 2021-08-16 NOTE — Discharge Summary (Signed)
Physician Discharge Summary  Jabaree Mercado OBS:[id number] DOB: [birth year] DOA: 08/10/2021  PCP: Tammi Sou, MD  Admit date: 08/10/2021 Discharge date: 08/16/2021  Admitted From: home Disposition:  SNF  Recommendations for Outpatient Follow-up:  Follow up with PCP in 1-2 weeks Please obtain BMP/CBC in one week Please follow up on the following pending results:  Home Health:NO  Equipment/Devices: NONE  Discharge Condition: Stable Code Status:   Code Status: Full Code Diet recommendation:  Diet Order             DIET DYS 2 Room service appropriate? Yes with Assist; Fluid consistency: Nectar Thick  Diet effective now                    Brief/Interim Summary: , 87 y.o. male with PMH of ambulatory dysfunction uses a cane to assist with ambulation at baseline, hypertension GERD, vitamin D deficiency presented to the emergency department after sustaining a fall at home.  Was also found to have slurred speech, aphasia by his family, left-sided facial droop and brought to the ED concern for acute stroke and patient was hospitalized seen by neurology. MRI showed right MCA territory infarcts, completed a stroke work-up with CT angio neck, echocardiogram lab with LDL 149 HbA1c 5.8 TSH 3.2 EEG no seizure-like activity, seen by PT OT advised CIR but family prefers to have a skilled nursing facility.  Neurology advised aspirin Plavix x3 weeks followed by aspirin alone. He had mild rhabdomyolysis with stable renal function, hypokalemia and leukocytosis. At this time patient clinically optimized, he will continue on aspirin Plavix x3 weeks followed by aspirin alone, Lipitor, he will continue with exclusive acid placement rehabilitation. Is medically stable for discharge Discharge Diagnoses:  Acute Right MCA territory infarcts:completed a stroke work-up with CT angio neck, echocardiogram- nl lvef,lab with LDL 149 HbA1c 5.8 TSH 3.2 EEG no seizure-like activity, seen by PT OT  advised CIR but family prefers to have a skilled nursing facility.  Neurology advised aspirin Plavix x3 weeks followed by aspirin alone.  Started on Lipitor 40 mg   Essential hypertension: BP fairly stable although higher than diastolic this morning.  He is back on clonidine.  On discharge teaching Diovan HCTZ , reassess for verapamil resumption as outpatient   Mild rhabdomyolysis CK1105 normalized subsequently with hydration. Hypokalemia resolved Leukocytosis likely reactive no fever.  Resolved Macular degeneration stable Vitamin D deficiency history continue cholecalciferol.   GERD continue PPI   Consults: neurology  Subjective: Aa0 pleasant, ready for snf today  Discharge Exam: Vitals:   08/16/21 0447 08/16/21 0741  BP: 126/72 (!) 151/81  Pulse: 68 70  Resp: 18 14  Temp: 98.5 F (36.9 C) 98.4 F (36.9 C)  SpO2: 96% 99%   General: Pt is alert, awake, not in acute distress Cardiovascular: RRR, S1/S2 +, no rubs, no gallops Respiratory: CTA bilaterally, no wheezing, no rhonchi Abdominal: Soft, NT, ND, bowel sounds + Extremities: no edema, no cyanosis  Discharge Instructions  Discharge Instructions     Ambulatory referral to Neurology   Complete by: As directed    Follow up with stroke clinic NP (Jessica Vanschaick or Cecille Rubin, if both not available, consider Zachery Dauer, or Ahern) at [facility] in about 4 weeks. Thanks.   Discharge instructions   Complete by: As directed    Continue Plavix therapy until 09/03/2021 Follow-up with neurology  Please call call MD or return to ER for similar or worsening recurring problem that brought you to hospital or if  any fever,nausea/vomiting,abdominal pain, uncontrolled pain, chest pain,  shortness of breath or any other alarming symptoms.  Please follow-up your doctor as instructed in a week time and call the office for appointment.  Please avoid alcohol, smoking, or any other illicit substance and maintain healthy habits  including taking your regular medications as prescribed.  You were cared for by a hospitalist during your hospital stay. If you have any questions about your discharge medications or the care you received while you were in the hospital after you are discharged, you can call the unit and ask to speak with the hospitalist on call if the hospitalist that took care of you is not available.  Once you are discharged, your primary care physician will handle any further medical issues. Please note that NO REFILLS for any discharge medications will be authorized once you are discharged, as it is imperative that you return to your primary care physician (or establish a relationship with a primary care physician if you do not have one) for your aftercare needs so that they can reassess your need for medications and monitor your lab values      Allergies as of 08/16/2021   No Known Allergies      Medication List     STOP taking these medications    verapamil 240 MG CR tablet Commonly known as: CALAN-SR       TAKE these medications    aspirin 81 MG EC tablet Take 1 tablet (81 mg total) by mouth daily. Swallow whole.   atorvastatin 40 MG tablet Commonly known as: LIPITOR Take 1 tablet (40 mg total) by mouth daily.   cloNIDine 0.1 MG tablet Commonly known as: CATAPRES TAKE 1 TABLET BY MOUTH TWICE A DAY   clopidogrel 75 MG tablet Commonly known as: PLAVIX Take 1 tablet (75 mg total) by mouth daily for 18 days. Plavix therapy will be stopped on 09/03/2021   EYLEA IO Inject into the eye. One injection into right eye every month   hydrochlorothiazide 50 MG tablet Commonly known as: HYDRODIURIL Take 1 tablet (50 mg total) by mouth daily.   omeprazole 40 MG capsule Commonly known as: PRILOSEC TAKE 1 CAPSULE BY MOUTH EVERY DAY What changed: how much to take   PRESERVISION AREDS 2 PO Take 1 tablet by mouth daily.   valsartan 320 MG tablet Commonly known as: DIOVAN TAKE 1 TABLET BY  MOUTH EVERY DAY   Vitamin D3 50 MCG (2000 UT) Tabs Take 1 tablet by mouth daily.        Follow-up Information     [facility]. Schedule an appointment as soon as possible for a visit in 1 month(s).   Specialty: Neurology Why: stroke clinic Contact information: [place] [phone]        Freada Bergeron, MD Follow up.   Specialties: Cardiology, Radiology Why: New Patient Visit with Cardiology scheduled for 10/11/2021 at 9:40am to discuss heart monitor results. Please arrive 15 mintues early for check-in. If this date/time does not work for you, please call our office to reschedule. Contact information: [address] [place] [address] [phone]                No Known Allergies  The results of significant diagnostics from this hospitalization (including imaging, microbiology, ancillary and laboratory) are listed below for reference.    Microbiology: Recent Results (from the past 240 hour(s))  Resp Panel by RT-PCR (Flu A&B,  Covid) Nasopharyngeal Swab     Status: None   Collection Time: 08/10/21  3:56 PM   Specimen: Nasopharyngeal Swab; Nasopharyngeal(NP) swabs in vial transport medium  Result Value Ref Range Status   SARS Coronavirus 2 by RT PCR NEGATIVE NEGATIVE Final    Comment: (NOTE) SARS-CoV-2 target nucleic acids are NOT DETECTED.  The SARS-CoV-2 RNA is generally detectable in upper respiratory specimens during the acute phase of infection. The lowest concentration of SARS-CoV-2 viral copies this assay can detect is 138 copies/mL. A negative result does not preclude SARS-Cov-2 infection and should not be used as the sole basis for treatment or other patient management decisions. A negative result may occur with  improper specimen collection/handling, submission of specimen other than nasopharyngeal swab, presence of viral mutation(s) within the areas  targeted by this assay, and inadequate number of viral copies(<138 copies/mL). A negative result must be combined with clinical observations, patient history, and epidemiological information. The expected result is Negative.  Fact Sheet for Patients:  EntrepreneurPulse.com.au  Fact Sheet for Healthcare Providers:  IncredibleEmployment.be  This test is no t yet approved or cleared by the [place] FDA and  has been authorized for detection and/or diagnosis of SARS-CoV-2 by FDA under an Emergency Use Authorization (EUA). This EUA will remain  in effect (meaning this test can be used) for the duration of the COVID-19 declaration under Section 564(b)(1) of the Act, 21 U.S.C.section 360bbb-3(b)(1), unless the authorization is terminated  or revoked sooner.       Influenza A by PCR NEGATIVE NEGATIVE Final   Influenza B by PCR NEGATIVE NEGATIVE Final    Comment: (NOTE) The Xpert Xpress SARS-CoV-2/FLU/RSV plus assay is intended as an aid in the diagnosis of influenza from Nasopharyngeal swab specimens and should not be used as a sole basis for treatment. Nasal washings and aspirates are unacceptable for Xpert Xpress SARS-CoV-2/FLU/RSV testing.  Fact Sheet for Patients: EntrepreneurPulse.com.au  Fact Sheet for Healthcare Providers: IncredibleEmployment.be  This test is not yet approved or cleared by the [place] FDA and has been authorized for detection and/or diagnosis of SARS-CoV-2 by FDA under an Emergency Use Authorization (EUA). This EUA will remain in effect (meaning this test can be used) for the duration of the COVID-19 declaration under Section 564(b)(1) of the Act, 21 U.S.C. section 360bbb-3(b)(1), unless the authorization is terminated or revoked.  Performed at [facility], [place] [address], [place], [place] [address]   Resp Panel by RT-PCR (Flu A&B, Covid) Nasopharyngeal Swab      Status: None   Collection Time: 08/14/21  4:07 PM   Specimen: Nasopharyngeal Swab; Nasopharyngeal(NP) swabs in vial transport medium  Result Value Ref Range Status   SARS Coronavirus 2 by RT PCR NEGATIVE NEGATIVE Final    Comment: (NOTE) SARS-CoV-2 target nucleic acids are NOT DETECTED.  The SARS-CoV-2 RNA is generally detectable in upper respiratory specimens during the acute phase of infection. The lowest concentration of SARS-CoV-2 viral copies this assay can detect is 138 copies/mL. A negative result does not preclude SARS-Cov-2 infection and should not be used as the sole basis for treatment or other patient management decisions. A negative result may occur with  improper specimen collection/handling, submission of specimen other than nasopharyngeal swab, presence of viral mutation(s) within the areas targeted by this assay, and inadequate number of viral copies(<138 copies/mL). A negative result must be combined with clinical observations, patient history, and epidemiological information. The expected result is Negative.  Fact Sheet for Patients:  EntrepreneurPulse.com.au  Fact Sheet for Healthcare Providers:  IncredibleEmployment.be  This test is no t yet approved or cleared by the [place] FDA and  has been authorized for detection and/or diagnosis of SARS-CoV-2 by FDA under an Emergency Use Authorization (EUA). This EUA will remain  in effect (meaning this test can be used) for the duration of the COVID-19 declaration under Section 564(b)(1) of the Act, 21 U.S.C.section 360bbb-3(b)(1), unless the authorization is terminated  or revoked sooner.       Influenza A by PCR NEGATIVE NEGATIVE Final   Influenza B by PCR NEGATIVE NEGATIVE Final    Comment: (NOTE) The Xpert Xpress SARS-CoV-2/FLU/RSV plus assay is intended as an aid in the diagnosis of influenza from Nasopharyngeal swab specimens and should not be used as a sole basis  for treatment. Nasal washings and aspirates are unacceptable for Xpert Xpress SARS-CoV-2/FLU/RSV testing.  Fact Sheet for Patients: EntrepreneurPulse.com.au  Fact Sheet for Healthcare Providers: IncredibleEmployment.be  This test is not yet approved or cleared by the [place] FDA and has been authorized for detection and/or diagnosis of SARS-CoV-2 by FDA under an Emergency Use Authorization (EUA). This EUA will remain in effect (meaning this test can be used) for the duration of the COVID-19 declaration under Section 564(b)(1) of the Act, 21 U.S.C. section 360bbb-3(b)(1), unless the authorization is terminated or revoked.  Performed at [facility], [place] [address] [place], [place] [address]     Procedures/Studies: CT ANGIO HEAD NECK W WO CM  Result Date: 08/10/2021 CLINICAL DATA:  Stroke suspected, neck pain, trauma EXAM: CT ANGIOGRAPHY HEAD AND NECK CT CERVICAL SPINE WITHOUT CONTRAST TECHNIQUE: Multidetector CT imaging of the head and neck was performed using the standard protocol during bolus administration of intravenous contrast. Multiplanar CT image reconstructions and MIPs were obtained to evaluate the vascular anatomy. Carotid stenosis measurements (when applicable) are obtained utilizing NASCET criteria, using the distal internal carotid diameter as the denominator. Multidetector CT imaging of the cervical spine was performed without intravenous contrast. Multiplanar CT image reconstructions were also generated. CONTRAST:  29mL OMNIPAQUE IOHEXOL 350 MG/ML SOLN COMPARISON:  No prior CT of the head or neck, correlation is made with MRI head 03/12/2014. FINDINGS: CT HEAD FINDINGS Brain: Area of low density in the lateral right posterior frontal lobe (series 5, image 24), which is new compared to the prior exam and may represent acute or subacute infarct. No acute hemorrhage, mass, mass effect, or midline shift. Advanced cerebral volume  loss, not significantly changed compared to 2015. No extra-axial collection. Unchanged size and configuration of the ventricles. Vascular: No hyperdense vessel. Skull: No acute osseous abnormality. Sinuses: Imaged portions are clear. Orbits: Status post bilateral lens replacements. Review of the MIP images confirms the above findings CT NECK FINDINGS Alignment: Trace anterolisthesis C7 on T1, which appears degenerative. Skull base and vertebrae: No acute fracture. No primary bone lesion or focal pathologic process. Soft tissues and spinal canal: No prevertebral fluid or swelling. No visible canal hematoma. Disc levels: Severe disc height loss C3-C7. moderate spinal canal stenosis at C6-C7. Multilevel facet and uncovertebral hypertrophy, with severe neural foraminal narrowing on the right at C3-C4 and C5-C6. Other: None. CTA NECK FINDINGS Aortic arch: Standard branching. Imaged portion shows no evidence of aneurysm or dissection. No significant stenosis of the major arch vessel origins. Right carotid system: No evidence of dissection, stenosis (50% or greater) or occlusion. Retropharyngeal course. Left carotid system: No evidence of dissection, stenosis (50% or greater) or occlusion. Vertebral arteries: Codominant. No  evidence of dissection, stenosis (50% or greater) or occlusion. Other neck: Negative Upper chest: No focal pulmonary opacity or pleural effusion. Review of the MIP images confirms the above findings CTA HEAD FINDINGS Anterior circulation: Both internal carotid arteries are patent to the termini, with mild irregularity but without stenosis or other abnormality. A1 segments patent. Normal anterior communicating artery. Anterior cerebral arteries are patent to their distal aspects. No M1 stenosis or occlusion. Normal MCA bifurcations. Distal MCA branches are slightly asymmetric but appear perfused. Posterior circulation: Vertebral arteries widely patent to the vertebrobasilar junction without stenosis. The  right posteroinferior cerebellar artery is not visualized. Basilar patent to its distal aspect. Superior cerebral arteries patent bilaterally. Focal narrowing of the left P1-P2 junction (series 11, images 105-108). PCAs are otherwise perfused to their distal aspects without stenosis. The posterior communicating arteries are not visualized. Venous sinuses: As permitted by contrast timing, patent. Anatomic variants: None significant Review of the MIP images confirms the above findings IMPRESSION: 1. Area of low density in the lateral right posterior frontal lobe, which is new compared to 2015 but is of indeterminate acuity. If there is concern for infarct in this territory, an MRI is recommended. 2. No acute fracture or traumatic subluxation in the cervical spine. 3. Focal narrowing of the left P1-P2 junction. Otherwise no significant intracranial stenosis. No large vessel occlusion. 4. No hemodynamically significant stenosis in the neck. These results were called by telephone at the time of interpretation on 08/10/2021 at 6:40 pm to provider [facility] , who verbally acknowledged these results. Electronically Signed   By: Merilyn Baba M.D.   On: 08/10/2021 18:40   DG Chest 1 View  Result Date: 08/10/2021 CLINICAL DATA:  Cough. EXAM: CHEST  1 VIEW COMPARISON:  Chest x-ray 07/13/2019. FINDINGS: There is a large hiatal hernia similar to the prior study. Cardiomediastinal silhouette is stable and within normal limits. There are atherosclerotic calcifications of the aorta. The lungs and costophrenic angles are clear. There is no pneumothorax. No acute fractures are seen. IMPRESSION: 1. Stable large hiatal hernia. 2. No acute cardiopulmonary process. Electronically Signed   By: Ronney Asters M.D.   On: 08/10/2021 21:26   MR BRAIN WO CONTRAST  Result Date: 08/11/2021 CLINICAL DATA:  Neuro deficit with acute stroke suspected. EXAM: MRI HEAD WITHOUT CONTRAST TECHNIQUE: Multiplanar, multiecho pulse sequences of  the brain and surrounding structures were obtained without intravenous contrast. COMPARISON:  Head CT from yesterday FINDINGS: Brain: Acute cortically based infarcts in the right posterior frontal and separately in the right parietal lobes, both MCA territory. No hemorrhage, hydrocephalus, or masslike finding. Generalized brain atrophy with variable sulcal widening of indeterminate cause. Chronic small vessel ischemia in the hemispheric white matter and pons, mild for age. Vascular: Major flow voids are preserved Skull and upper cervical spine: Negative Sinuses/Orbits: Bilateral cataract resection and staphyloma. IMPRESSION: 1. Acute right MCA branch infarcts affecting frontal and parietal cortex. 2. Significant brain atrophy. Electronically Signed   By: Jorje Guild M.D.   On: 08/11/2021 08:19   CT C-SPINE NO CHARGE  Result Date: 08/10/2021 CLINICAL DATA:  Stroke suspected, neck pain, trauma EXAM: CT ANGIOGRAPHY HEAD AND NECK CT CERVICAL SPINE WITHOUT CONTRAST TECHNIQUE: Multidetector CT imaging of the head and neck was performed using the standard protocol during bolus administration of intravenous contrast. Multiplanar CT image reconstructions and MIPs were obtained to evaluate the vascular anatomy. Carotid stenosis measurements (when applicable) are obtained utilizing NASCET criteria, using the distal internal carotid diameter as the denominator.  Multidetector CT imaging of the cervical spine was performed without intravenous contrast. Multiplanar CT image reconstructions were also generated. CONTRAST:  62mL OMNIPAQUE IOHEXOL 350 MG/ML SOLN COMPARISON:  No prior CT of the head or neck, correlation is made with MRI head 03/12/2014. FINDINGS: CT HEAD FINDINGS Brain: Area of low density in the lateral right posterior frontal lobe (series 5, image 24), which is new compared to the prior exam and may represent acute or subacute infarct. No acute hemorrhage, mass, mass effect, or midline shift. Advanced  cerebral volume loss, not significantly changed compared to 2015. No extra-axial collection. Unchanged size and configuration of the ventricles. Vascular: No hyperdense vessel. Skull: No acute osseous abnormality. Sinuses: Imaged portions are clear. Orbits: Status post bilateral lens replacements. Review of the MIP images confirms the above findings CT NECK FINDINGS Alignment: Trace anterolisthesis C7 on T1, which appears degenerative. Skull base and vertebrae: No acute fracture. No primary bone lesion or focal pathologic process. Soft tissues and spinal canal: No prevertebral fluid or swelling. No visible canal hematoma. Disc levels: Severe disc height loss C3-C7. moderate spinal canal stenosis at C6-C7. Multilevel facet and uncovertebral hypertrophy, with severe neural foraminal narrowing on the right at C3-C4 and C5-C6. Other: None. CTA NECK FINDINGS Aortic arch: Standard branching. Imaged portion shows no evidence of aneurysm or dissection. No significant stenosis of the major arch vessel origins. Right carotid system: No evidence of dissection, stenosis (50% or greater) or occlusion. Retropharyngeal course. Left carotid system: No evidence of dissection, stenosis (50% or greater) or occlusion. Vertebral arteries: Codominant. No evidence of dissection, stenosis (50% or greater) or occlusion. Other neck: Negative Upper chest: No focal pulmonary opacity or pleural effusion. Review of the MIP images confirms the above findings CTA HEAD FINDINGS Anterior circulation: Both internal carotid arteries are patent to the termini, with mild irregularity but without stenosis or other abnormality. A1 segments patent. Normal anterior communicating artery. Anterior cerebral arteries are patent to their distal aspects. No M1 stenosis or occlusion. Normal MCA bifurcations. Distal MCA branches are slightly asymmetric but appear perfused. Posterior circulation: Vertebral arteries widely patent to the vertebrobasilar junction  without stenosis. The right posteroinferior cerebellar artery is not visualized. Basilar patent to its distal aspect. Superior cerebral arteries patent bilaterally. Focal narrowing of the left P1-P2 junction (series 11, images 105-108). PCAs are otherwise perfused to their distal aspects without stenosis. The posterior communicating arteries are not visualized. Venous sinuses: As permitted by contrast timing, patent. Anatomic variants: None significant Review of the MIP images confirms the above findings IMPRESSION: 1. Area of low density in the lateral right posterior frontal lobe, which is new compared to 2015 but is of indeterminate acuity. If there is concern for infarct in this territory, an MRI is recommended. 2. No acute fracture or traumatic subluxation in the cervical spine. 3. Focal narrowing of the left P1-P2 junction. Otherwise no significant intracranial stenosis. No large vessel occlusion. 4. No hemodynamically significant stenosis in the neck. These results were called by telephone at the time of interpretation on 08/10/2021 at 6:40 pm to provider [facility] , who verbally acknowledged these results. Electronically Signed   By: Merilyn Baba M.D.   On: 08/10/2021 18:40   DG Swallowing Func-Speech Pathology  Result Date: 08/14/2021 Table formatting from the original result was not included. Objective Swallowing Evaluation: Type of Study: MBS-Modified Barium Swallow Study  Patient Details Name: Nil Xiong MRN: [id number] Date of Birth: 08-30-1927 Today's Date: 08/14/2021 Time: SLP Start Time (ACUTE ONLY):  0930 -SLP Stop Time (ACUTE ONLY): 0950 SLP Time Calculation (min) (ACUTE ONLY): 20 min Past Medical History: Past Medical History: Diagnosis Date  Arthritis   Ataxia 2014/15  brain MRI 02/2014 showed encephalomalacia parietal and occip lobes c/w old infarcts, plus diffuse cerebral atrophy, o/w normal  Cataract   Chronic renal insufficiency, stage 2 (mild) 12/2016  GFR 60s-70s  DDD  (degenerative disc disease)   L-spine  Dermatitis   ?Contact derm? per [facility] MD: Dr. Threasa Alpha (05/09/16): punch bx 06/18/16: chronic eczematous derm/contact derm, neg fungal stain.  Allergy (RAST) testing NEG.  GERD (gastroesophageal reflux disease)   Heme positive stool 02/2012  colonoscopy normal.  EGD showed small hiatal hernia with associated Cameron's erosions.  Hemoccults NEG x 3 09/2014. Hem + iron def 07/2019->conservative mgmt.  Hyperlipidemia   Hypertension   Insomnia   poor sleep hygiene.  Clonidine for his HTN has helped a little.  Iron deficiency anemia 02/2012; 07/2019  2013 Mild iron deficiency->small upper GI erosions, colonoscopy normal.  07/2019->Hb 7.5, heme+, GI eval->pt chose no endoscopy->IV iron and Hb monitoring, PO iron indefinitely: Hb/iron normal on recheck 10/2019 and 12/2019.  Macular degeneration, age related   exudative R eye; nonexudative L eye.  Vitreous degeneration bilat.  Obesity (BMI 30-39.9)   Retinal hemorrhage 2016  w/ retinal detachment (right eye) : anticoagulants stopped due to this  Venous insufficiency of both lower extremities  Past Surgical History: Past Surgical History: Procedure Laterality Date  APPENDECTOMY  1935  cataract surg    Bilateral/ 08-13  COLONOSCOPY  05/12/12  for iron def anemia--NORMAL RESULT  ESOPHAGOGASTRODUODENOSCOPY  06/17/12  Small hiatal hernia with associated Cameron's erosions--likely source of his mild iron def anemia.  NASAL SINUS SURGERY    1998  TONSILLECTOMY  1935 HPI: Pt is a 87 y.o. male who presented to the ED after sustaining a fall at home. He was found to have slurred speech and aphasia by his family. MRI showed right MCA affecting frontal and parietal regions. Failed [facility] Swallow Screen 08/10/21. PMH: ambulatory dysfunction uses a cane to assist with ambulation at baseline, hypertension GERD, vitamin D deficiency.  No data recorded  Recommendations for follow up therapy are one component of a multi-disciplinary discharge planning  process, led by the attending physician.  Recommendations may be updated based on patient status, additional functional criteria and insurance authorization. Assessment / Plan / Recommendation Clinical Impressions 08/14/2021 Clinical Impression Pt demonstrates a mild to moderate oral dysphagia due to sensory and motor impairment of the left tongue and cheek. Pt have left anterior spillage and mild left buccal residue with thin and moderate with solids that he cleared with verbal cues and a liquid wash. Pt additionally had good airway protection and strength with single sips, but there were two or three instances of late laryngeal elevation with consecutive cup/straw sips resulting in silent trace penetration and silent trace flash aspiration. With supervision and assist for single sips and clearance of left side of mouth pt could potentially tolerate a dys 2/thin liquid diet. Will upgrade and monitor for tolerance with meals in particular. SLP Visit Diagnosis Dysphagia, oropharyngeal phase (R13.12) Attention and concentration deficit following -- Frontal lobe and executive function deficit following -- Impact on safety and function Mild aspiration risk   Treatment Recommendations 08/14/2021 Treatment Recommendations Therapy as outlined in treatment plan below   Prognosis 08/14/2021 Prognosis for Safe Diet Advancement Good Barriers to Reach Goals -- Barriers/Prognosis Comment -- Diet Recommendations 08/14/2021 SLP Diet Recommendations Dysphagia 2 (  Fine chop) solids;Thin liquid Liquid Administration via Cup;Straw Medication Administration Whole meds with liquid Compensations Monitor for anterior loss;Lingual sweep for clearance of pocketing;Small sips/bites;Slow rate;Minimize environmental distractions;Follow solids with liquid Postural Changes --   Other Recommendations 08/14/2021 Recommended Consults -- Oral Care Recommendations Oral care BID Other Recommendations Have oral suction available Follow Up  Recommendations Acute inpatient rehab (3hours/day) Assistance recommended at discharge Frequent or constant Supervision/Assistance Functional Status Assessment Patient has had a recent decline in their functional status and demonstrates the ability to make significant improvements in function in a reasonable and predictable amount of time. Frequency and Duration  08/14/2021 Speech Therapy Frequency (ACUTE ONLY) min 2x/week Treatment Duration 2 weeks   Oral Phase 08/14/2021 Oral Phase Impaired Oral - Pudding Teaspoon -- Oral - Pudding Cup -- Oral - Honey Teaspoon -- Oral - Honey Cup -- Oral - Nectar Teaspoon -- Oral - Nectar Cup -- Oral - Nectar Straw -- Oral - Thin Teaspoon -- Oral - Thin Cup Left anterior bolus loss Oral - Thin Straw Left anterior bolus loss Oral - Puree Left anterior bolus loss;Left pocketing in lateral sulci Oral - Mech Soft -- Oral - Regular Left anterior bolus loss;Left pocketing in lateral sulci;Delayed oral transit;Reduced posterior propulsion Oral - Multi-Consistency -- Oral - Pill Delayed oral transit Oral Phase - Comment --  Pharyngeal Phase 08/14/2021 Pharyngeal Phase Impaired Pharyngeal- Pudding Teaspoon -- Pharyngeal -- Pharyngeal- Pudding Cup -- Pharyngeal -- Pharyngeal- Honey Teaspoon -- Pharyngeal -- Pharyngeal- Honey Cup -- Pharyngeal -- Pharyngeal- Nectar Teaspoon -- Pharyngeal -- Pharyngeal- Nectar Cup -- Pharyngeal -- Pharyngeal- Nectar Straw -- Pharyngeal -- Pharyngeal- Thin Teaspoon -- Pharyngeal -- Pharyngeal- Thin Cup Penetration/Aspiration before swallow;Trace aspiration Pharyngeal Material enters airway, passes BELOW cords then ejected out Pharyngeal- Thin Straw Penetration/Aspiration before swallow Pharyngeal Material enters airway, remains ABOVE vocal cords and not ejected out Pharyngeal- Puree WFL Pharyngeal -- Pharyngeal- Mechanical Soft -- Pharyngeal -- Pharyngeal- Regular WFL Pharyngeal -- Pharyngeal- Multi-consistency -- Pharyngeal -- Pharyngeal- Pill -- Pharyngeal  -- Pharyngeal Comment --  No flowsheet data found. Herbie Baltimore, MA CCC-SLP Acute Rehabilitation Services Office [phone] Lynann Beaver 08/14/2021, 11:02 AM                     EEG adult  Result Date: 08/11/2021 Derek Jack, MD     08/11/2021  8:36 PM Routine EEG Report Zeven Kocak is a 87 y.o. male with a history of stroke who is undergoing an EEG to evaluate for seizures. Report: This EEG was acquired with electrodes placed according to the International 10-20 electrode system (including Fp1, Fp2, F3, F4, C3, C4, P3, P4, O1, O2, T3, T4, T5, T6, A1, A2, Fz, Cz, Pz). The following electrodes were missing or displaced: none. The occipital dominant rhythm was 8.5 Hz. This activity is reactive to stimulation. Drowsiness was manifested by background fragmentation; deeper stages of sleep were identified by K complexes and sleep spindles. There was no focal slowing. There were no interictal epileptiform discharges. There were no electrographic seizures identified. Photic stimulation and hyperventilation were not performed. Impression: This EEG was obtained while awake and asleep and is normal.   Clinical Correlation: Normal EEGs, however, do not rule out epilepsy. Su Monks, MD [facility] [phone] If 7pm- 7am, please page neurology on call as listed in [place].   ECHOCARDIOGRAM COMPLETE  Result Date: 08/11/2021    ECHOCARDIOGRAM REPORT   Patient Name:   OLEN EAVES Date of Exam: 08/11/2021 Medical Rec #:  [id number]              Height:       70.0 in Accession #:    [id number]             Weight:       195.2 lb Date of Birth:  Jun 13, 1927              BSA:          2.066 m Patient Age:    87 years               BP:           163/94 mmHg Patient Gender: M                      HR:           94 bpm. Exam Location:  Inpatient Procedure: 2D Echo, Cardiac Doppler and Color Doppler Indications:    Stroke  History:        Patient has no prior history of  Echocardiogram examinations.                 Stroke; Risk Factors:Hyperlipidemia and Hypertension.  Sonographer:    Glo Herring Referring Phys: [place]  1. No LV mural thrombus observed with Definity contrast. Wall motion abnormalities are suggestive of ischemic cardiomyopathy although stress induced cardiomyopathy is not excluded.  2. Left ventricular ejection fraction, by estimation, is 50 to 55%. The left ventricle has low normal function. The left ventricle demonstrates regional wall motion abnormalities (see scoring diagram/findings for description). There is moderate concentric left ventricular hypertrophy. Left ventricular diastolic parameters are indeterminate.  3. Right ventricular systolic function is normal. The right ventricular size is normal. Tricuspid regurgitation signal is inadequate for assessing PA pressure.  4. The mitral valve is grossly normal. Trivial mitral valve regurgitation.  5. The aortic valve is tricuspid with significantly restricted motion of the noncoronary cusp. There is moderate calcification of the aortic valve. Aortic valve regurgitation is mild. Mild aortic valve stenosis. Aortic valve mean gradient measures 8.0 mmHg. Dimentionless index 0.42.  6. The inferior vena cava is normal in size with greater than 50% respiratory variability, suggesting right atrial pressure of 3 mmHg. Comparison(s): No prior Echocardiogram. FINDINGS  Left Ventricle: Left ventricular ejection fraction, by estimation, is 50 to 55%. The left ventricle has low normal function. The left ventricle demonstrates regional wall motion abnormalities. The left ventricular internal cavity size was normal in size. There is moderate concentric left ventricular hypertrophy. Left ventricular diastolic parameters are indeterminate.  LV Wall Scoring: The entire apex is akinetic. The mid anteroseptal segment is hypokinetic. The anterior wall, antero-lateral wall, inferior wall, posterior  wall, basal anteroseptal segment, mid inferoseptal segment, and basal inferoseptal segment are normal. Right Ventricle: The right ventricular size is normal. No increase in right ventricular wall thickness. Right ventricular systolic function is normal. Tricuspid regurgitation signal is inadequate for assessing PA pressure. Left Atrium: Left atrial size was normal in size. Right Atrium: Right atrial size was normal in size. Pericardium: There is no evidence of pericardial effusion. Mitral Valve: The mitral valve is grossly normal. Trivial mitral valve regurgitation. Tricuspid Valve: The tricuspid valve is not well visualized. Tricuspid valve regurgitation is trivial. Aortic Valve: The aortic valve is tricuspid. There is moderate calcification of the aortic valve. There is mild aortic valve annular calcification. Aortic valve regurgitation is mild. Mild aortic stenosis is present. Aortic valve  mean gradient measures 8.0 mmHg. Aortic valve peak gradient measures 15.5 mmHg. Aortic valve area, by VTI measures 1.31 cm. Pulmonic Valve: The pulmonic valve was not well visualized. Pulmonic valve regurgitation is trivial. Aorta: The aortic root is normal in size and structure. Venous: The inferior vena cava is normal in size with greater than 50% respiratory variability, suggesting right atrial pressure of 3 mmHg. IAS/Shunts: No atrial level shunt detected by color flow Doppler.  LEFT VENTRICLE PLAX 2D LVIDd:         4.10 cm LVIDs:         2.70 cm LV PW:         1.30 cm LV IVS:        1.30 cm LVOT diam:     2.00 cm LV SV:         46 LV SV Index:   22 LVOT Area:     3.14 cm  LV Volumes (MOD) LV vol d, MOD A2C: 118.0 ml LV vol d, MOD A4C: 223.0 ml LV vol s, MOD A2C: 76.0 ml LV vol s, MOD A4C: 82.3 ml LV SV MOD A2C:     42.0 ml LV SV MOD A4C:     223.0 ml LV SV MOD BP:      82.1 ml IVC IVC diam: 1.90 cm LEFT ATRIUM             Index LA diam:        4.90 cm 2.37 cm/m LA Vol (A2C):   99.8 ml 48.31 ml/m LA Vol (A4C):   77.8 ml  37.66 ml/m LA Biplane Vol: 87.9 ml 42.55 ml/m  AORTIC VALVE                     PULMONIC VALVE AV Area (Vmax):    1.40 cm      PV Vmax:       0.83 m/s AV Area (Vmean):   1.40 cm      PV Peak grad:  2.8 mmHg AV Area (VTI):     1.31 cm AV Vmax:           197.00 cm/s AV Vmean:          131.000 cm/s AV VTI:            0.350 m AV Peak Grad:      15.5 mmHg AV Mean Grad:      8.0 mmHg LVOT Vmax:         87.55 cm/s LVOT Vmean:        58.350 cm/s LVOT VTI:          0.146 m LVOT/AV VTI ratio: 0.42  AORTA Ao Root diam: 3.60 cm Ao Asc diam:  3.10 cm  SHUNTS Systemic VTI:  0.15 m Systemic Diam: 2.00 cm Rozann Lesches MD Electronically signed by Rozann Lesches MD Signature Date/Time: 08/11/2021/12:31:23 PM    Final    VAS [place] LOWER EXTREMITY VENOUS (DVT)  Result Date: 08/13/2021  Lower Venous DVT Study Patient Name:  HORRIS SPEROS  Date of Exam:   08/13/2021 Medical Rec #: [id number]               Accession #:    [id number] Date of Birth: 1927/07/23               Patient Gender: M Patient Age:   28 years Exam Location:  [facility] Procedure:      VAS [place] LOWER EXTREMITY VENOUS (DVT)  Referring Phys: Rosalin Hawking --------------------------------------------------------------------------------  Indications: Stroke.  Comparison Study: No prior study on file Performing Technologist: Sharion Dove RVS  Examination Guidelines: A complete evaluation includes B-mode imaging, spectral Doppler, color Doppler, and power Doppler as needed of all accessible portions of each vessel. Bilateral testing is considered an integral part of a complete examination. Limited examinations for reoccurring indications may be performed as noted. The reflux portion of the exam is performed with the patient in reverse Trendelenburg.  +---------+---------------+---------+-----------+----------+--------------+ RIGHT    CompressibilityPhasicitySpontaneityPropertiesThrombus Aging  +---------+---------------+---------+-----------+----------+--------------+ CFV      Full           Yes      Yes                                 +---------+---------------+---------+-----------+----------+--------------+ SFJ      Full                                                        +---------+---------------+---------+-----------+----------+--------------+ FV Prox  Full                                                        +---------+---------------+---------+-----------+----------+--------------+ FV Mid   Full                                                        +---------+---------------+---------+-----------+----------+--------------+ FV DistalFull                                                        +---------+---------------+---------+-----------+----------+--------------+ PFV      Full                                                        +---------+---------------+---------+-----------+----------+--------------+ POP      Full           Yes      Yes                                 +---------+---------------+---------+-----------+----------+--------------+ PTV      Full                                                        +---------+---------------+---------+-----------+----------+--------------+ PERO     Full                                                        +---------+---------------+---------+-----------+----------+--------------+   +---------+---------------+---------+-----------+----------+--------------+  LEFT     CompressibilityPhasicitySpontaneityPropertiesThrombus Aging +---------+---------------+---------+-----------+----------+--------------+ CFV      Full           Yes      Yes                                 +---------+---------------+---------+-----------+----------+--------------+ SFJ      Full                                                         +---------+---------------+---------+-----------+----------+--------------+ FV Prox  Full                                                        +---------+---------------+---------+-----------+----------+--------------+ FV Mid   Full                                                        +---------+---------------+---------+-----------+----------+--------------+ FV DistalFull                                                        +---------+---------------+---------+-----------+----------+--------------+ PFV      Full                                                        +---------+---------------+---------+-----------+----------+--------------+ POP      Full           Yes      Yes                                 +---------+---------------+---------+-----------+----------+--------------+ PTV      Full                                                        +---------+---------------+---------+-----------+----------+--------------+ PERO     Full                                                        +---------+---------------+---------+-----------+----------+--------------+     Summary: BILATERAL: - No evidence of deep vein thrombosis seen in the lower extremities, bilaterally. -No evidence of popliteal cyst, bilaterally.   *See table(s) above for measurements and observations. Electronically signed by Monica Martinez MD on 08/13/2021 at 12:04:06 PM.  Final     Labs: BNP (last 3 results) No results for input(s): BNP in the last 8760 hours. Basic Metabolic Panel: Recent Labs  Lab 08/10/21 1556 08/10/21 1621 08/12/21 0222 08/13/21 0317 08/15/21 0152  NA 134* 135 133* 137 133*  K 3.3* 3.3* 2.9* 3.6 3.9  CL 98 98 98 106 102  CO2 25  --  24 23 24   GLUCOSE 120* 120* 128* 103* 110*  BUN 18 24* 22 29* 20  CREATININE 0.94 0.90 0.91 0.87 0.70  CALCIUM 9.0  --  8.4* 8.2* 8.2*  MG  --   --  2.0  --  2.0   Liver Function Tests: Recent Labs  Lab  08/10/21 1556 08/12/21 0222 08/13/21 0317  AST 58* 38 25  ALT 26 25 21   ALKPHOS 97 88 66  BILITOT 0.7 1.0 0.8  PROT 6.6 6.2* 5.7*  ALBUMIN 3.5 3.0* 2.8*   No results for input(s): LIPASE, AMYLASE in the last 168 hours. No results for input(s): AMMONIA in the last 168 hours. CBC: Recent Labs  Lab 08/10/21 1556 08/10/21 1621 08/12/21 0222 08/13/21 0317 08/15/21 0152  WBC 12.6*  --  17.9* 11.2* 8.7  NEUTROABS 10.3*  --   --   --   --   HGB 14.3 15.0 15.2 13.2 13.1  HCT 41.0 44.0 42.9 38.8* 39.2  MCV 95.6  --  94.7 97.5 97.5  PLT 356  --  319 310 304   Cardiac Enzymes: Recent Labs  Lab 08/10/21 2000 08/11/21 0339 08/13/21 0317  CKTOTAL 1,185* 940* 128   BNP: Invalid input(s): POCBNP CBG: No results for input(s): GLUCAP in the last 168 hours. D-Dimer No results for input(s): DDIMER in the last 72 hours. Hgb A1c No results for input(s): HGBA1C in the last 72 hours. Lipid Profile No results for input(s): CHOL, HDL, LDLCALC, TRIG, CHOLHDL, LDLDIRECT in the last 72 hours. Thyroid function studies No results for input(s): TSH, T4TOTAL, T3FREE, THYROIDAB in the last 72 hours.  Invalid input(s): FREET3 Anemia work up No results for input(s): VITAMINB12, FOLATE, FERRITIN, TIBC, IRON, RETICCTPCT in the last 72 hours. Urinalysis    Component Value Date/Time   COLORURINE STRAW (A) 08/10/2021 1556   APPEARANCEUR CLEAR 08/10/2021 1556   LABSPEC 1.024 08/10/2021 1556   PHURINE 7.0 08/10/2021 1556   GLUCOSEU NEGATIVE 08/10/2021 1556   HGBUR MODERATE (A) 08/10/2021 1556   BILIRUBINUR NEGATIVE 08/10/2021 1556   BILIRUBINUR neg 07/08/2019 1547   KETONESUR 20 (A) 08/10/2021 1556   PROTEINUR NEGATIVE 08/10/2021 1556   UROBILINOGEN 1.0 07/08/2019 1547   NITRITE NEGATIVE 08/10/2021 1556   LEUKOCYTESUR NEGATIVE 08/10/2021 1556   Sepsis Labs Invalid input(s): PROCALCITONIN,  WBC,  LACTICIDVEN Microbiology Recent Results (from the past 240 hour(s))  Resp Panel by RT-PCR  (Flu A&B, Covid) Nasopharyngeal Swab     Status: None   Collection Time: 08/10/21  3:56 PM   Specimen: Nasopharyngeal Swab; Nasopharyngeal(NP) swabs in vial transport medium  Result Value Ref Range Status   SARS Coronavirus 2 by RT PCR NEGATIVE NEGATIVE Final    Comment: (NOTE) SARS-CoV-2 target nucleic acids are NOT DETECTED.  The SARS-CoV-2 RNA is generally detectable in upper respiratory specimens during the acute phase of infection. The lowest concentration of SARS-CoV-2 viral copies this assay can detect is 138 copies/mL. A negative result does not preclude SARS-Cov-2 infection and should not be used as the sole basis for treatment or other patient management decisions. A negative result may occur with  improper specimen  collection/handling, submission of specimen other than nasopharyngeal swab, presence of viral mutation(s) within the areas targeted by this assay, and inadequate number of viral copies(<138 copies/mL). A negative result must be combined with clinical observations, patient history, and epidemiological information. The expected result is Negative.  Fact Sheet for Patients:  EntrepreneurPulse.com.au  Fact Sheet for Healthcare Providers:  IncredibleEmployment.be  This test is no t yet approved or cleared by the [place] FDA and  has been authorized for detection and/or diagnosis of SARS-CoV-2 by FDA under an Emergency Use Authorization (EUA). This EUA will remain  in effect (meaning this test can be used) for the duration of the COVID-19 declaration under Section 564(b)(1) of the Act, 21 U.S.C.section 360bbb-3(b)(1), unless the authorization is terminated  or revoked sooner.       Influenza A by PCR NEGATIVE NEGATIVE Final   Influenza B by PCR NEGATIVE NEGATIVE Final    Comment: (NOTE) The Xpert Xpress SARS-CoV-2/FLU/RSV plus assay is intended as an aid in the diagnosis of influenza from Nasopharyngeal swab specimens  and should not be used as a sole basis for treatment. Nasal washings and aspirates are unacceptable for Xpert Xpress SARS-CoV-2/FLU/RSV testing.  Fact Sheet for Patients: EntrepreneurPulse.com.au  Fact Sheet for Healthcare Providers: IncredibleEmployment.be  This test is not yet approved or cleared by the [place] FDA and has been authorized for detection and/or diagnosis of SARS-CoV-2 by FDA under an Emergency Use Authorization (EUA). This EUA will remain in effect (meaning this test can be used) for the duration of the COVID-19 declaration under Section 564(b)(1) of the Act, 21 U.S.C. section 360bbb-3(b)(1), unless the authorization is terminated or revoked.  Performed at [facility], [place] [address] [place], [place] [address]   Resp Panel by RT-PCR (Flu A&B, Covid) Nasopharyngeal Swab     Status: None   Collection Time: 08/14/21  4:07 PM   Specimen: Nasopharyngeal Swab; Nasopharyngeal(NP) swabs in vial transport medium  Result Value Ref Range Status   SARS Coronavirus 2 by RT PCR NEGATIVE NEGATIVE Final    Comment: (NOTE) SARS-CoV-2 target nucleic acids are NOT DETECTED.  The SARS-CoV-2 RNA is generally detectable in upper respiratory specimens during the acute phase of infection. The lowest concentration of SARS-CoV-2 viral copies this assay can detect is 138 copies/mL. A negative result does not preclude SARS-Cov-2 infection and should not be used as the sole basis for treatment or other patient management decisions. A negative result may occur with  improper specimen collection/handling, submission of specimen other than nasopharyngeal swab, presence of viral mutation(s) within the areas targeted by this assay, and inadequate number of viral copies(<138 copies/mL). A negative result must be combined with clinical observations, patient history, and epidemiological information. The expected result is Negative.  Fact Sheet  for Patients:  EntrepreneurPulse.com.au  Fact Sheet for Healthcare Providers:  IncredibleEmployment.be  This test is no t yet approved or cleared by the [place] FDA and  has been authorized for detection and/or diagnosis of SARS-CoV-2 by FDA under an Emergency Use Authorization (EUA). This EUA will remain  in effect (meaning this test can be used) for the duration of the COVID-19 declaration under Section 564(b)(1) of the Act, 21 U.S.C.section 360bbb-3(b)(1), unless the authorization is terminated  or revoked sooner.       Influenza A by PCR NEGATIVE NEGATIVE Final   Influenza B by PCR NEGATIVE NEGATIVE Final    Comment: (NOTE) The Xpert Xpress SARS-CoV-2/FLU/RSV plus assay is intended as an aid in the diagnosis  of influenza from Nasopharyngeal swab specimens and should not be used as a sole basis for treatment. Nasal washings and aspirates are unacceptable for Xpert Xpress SARS-CoV-2/FLU/RSV testing.  Fact Sheet for Patients: EntrepreneurPulse.com.au  Fact Sheet for Healthcare Providers: IncredibleEmployment.be  This test is not yet approved or cleared by the [place] FDA and has been authorized for detection and/or diagnosis of SARS-CoV-2 by FDA under an Emergency Use Authorization (EUA). This EUA will remain in effect (meaning this test can be used) for the duration of the COVID-19 declaration under Section 564(b)(1) of the Act, 21 U.S.C. section 360bbb-3(b)(1), unless the authorization is terminated or revoked.  Performed at [facility], [place] [address] [place], [place] [address]      Time coordinating discharge: 25 minutes  SIGNED: Antonieta Pert, MD  [facility] 08/16/2021, 9:27 AM  If 7PM-7AM, please contact night-coverage [email or web address]

## 2021-08-16 NOTE — Progress Notes (Signed)
Handoff report given to receiving facility. Pt waiting transport via [facility]. Discharge packet ready.

## 2021-08-17 DIAGNOSIS — I69351 Hemiplegia and hemiparesis following cerebral infarction affecting right dominant side: Secondary | ICD-10-CM | POA: Diagnosis not present

## 2021-08-17 DIAGNOSIS — N189 Chronic kidney disease, unspecified: Secondary | ICD-10-CM | POA: Diagnosis not present

## 2021-08-17 DIAGNOSIS — H3530 Unspecified macular degeneration: Secondary | ICD-10-CM | POA: Diagnosis not present

## 2021-08-17 DIAGNOSIS — E785 Hyperlipidemia, unspecified: Secondary | ICD-10-CM | POA: Diagnosis not present

## 2021-08-17 DIAGNOSIS — K219 Gastro-esophageal reflux disease without esophagitis: Secondary | ICD-10-CM | POA: Diagnosis not present

## 2021-08-17 DIAGNOSIS — D649 Anemia, unspecified: Secondary | ICD-10-CM | POA: Diagnosis not present

## 2021-08-17 DIAGNOSIS — I872 Venous insufficiency (chronic) (peripheral): Secondary | ICD-10-CM | POA: Diagnosis not present

## 2021-08-17 DIAGNOSIS — I10 Essential (primary) hypertension: Secondary | ICD-10-CM | POA: Diagnosis not present

## 2021-08-21 ENCOUNTER — Other Ambulatory Visit: Payer: Self-pay | Admitting: Family Medicine

## 2021-08-22 ENCOUNTER — Encounter: Payer: Self-pay | Admitting: Family Medicine

## 2021-08-22 ENCOUNTER — Encounter: Payer: Self-pay | Admitting: *Deleted

## 2021-08-22 NOTE — Progress Notes (Signed)
Patient ID: Glen Barnett, male   DOB: 1927-03-20, 87 y.o.   MRN: [id number] Patient enrolled for [employer] to ship a 30 day cardiac event monitor to [facility] in care of Giannis [facility] [place], [address],  [place], [place]  [address] Attn: charge nurse, Nicholaus Corolla, RN  Letter with instructions mailed to same address.

## 2021-08-28 DIAGNOSIS — R2689 Other abnormalities of gait and mobility: Secondary | ICD-10-CM | POA: Diagnosis not present

## 2021-08-28 DIAGNOSIS — I69351 Hemiplegia and hemiparesis following cerebral infarction affecting right dominant side: Secondary | ICD-10-CM | POA: Diagnosis not present

## 2021-08-28 DIAGNOSIS — R260 Ataxic gait: Secondary | ICD-10-CM | POA: Diagnosis not present

## 2021-08-28 DIAGNOSIS — R4701 Aphasia: Secondary | ICD-10-CM | POA: Diagnosis not present

## 2021-08-28 DIAGNOSIS — I639 Cerebral infarction, unspecified: Secondary | ICD-10-CM | POA: Diagnosis not present

## 2021-08-28 DIAGNOSIS — K219 Gastro-esophageal reflux disease without esophagitis: Secondary | ICD-10-CM | POA: Diagnosis not present

## 2021-08-28 DIAGNOSIS — R41841 Cognitive communication deficit: Secondary | ICD-10-CM | POA: Diagnosis not present

## 2021-08-28 DIAGNOSIS — N189 Chronic kidney disease, unspecified: Secondary | ICD-10-CM | POA: Diagnosis not present

## 2021-08-28 DIAGNOSIS — R1312 Dysphagia, oropharyngeal phase: Secondary | ICD-10-CM | POA: Diagnosis not present

## 2021-08-28 DIAGNOSIS — Z8673 Personal history of transient ischemic attack (TIA), and cerebral infarction without residual deficits: Secondary | ICD-10-CM | POA: Diagnosis not present

## 2021-08-28 DIAGNOSIS — M6259 Muscle wasting and atrophy, not elsewhere classified, multiple sites: Secondary | ICD-10-CM | POA: Diagnosis not present

## 2021-08-28 DIAGNOSIS — D649 Anemia, unspecified: Secondary | ICD-10-CM | POA: Diagnosis not present

## 2021-08-28 DIAGNOSIS — R2990 Unspecified symptoms and signs involving the nervous system: Secondary | ICD-10-CM | POA: Diagnosis not present

## 2021-08-28 DIAGNOSIS — I10 Essential (primary) hypertension: Secondary | ICD-10-CM | POA: Diagnosis not present

## 2021-08-28 DIAGNOSIS — I872 Venous insufficiency (chronic) (peripheral): Secondary | ICD-10-CM | POA: Diagnosis not present

## 2021-08-28 DIAGNOSIS — M5136 Other intervertebral disc degeneration, lumbar region: Secondary | ICD-10-CM | POA: Diagnosis not present

## 2021-08-28 DIAGNOSIS — Z8739 Personal history of other diseases of the musculoskeletal system and connective tissue: Secondary | ICD-10-CM | POA: Diagnosis not present

## 2021-08-28 DIAGNOSIS — M6281 Muscle weakness (generalized): Secondary | ICD-10-CM | POA: Diagnosis not present

## 2021-08-28 DIAGNOSIS — E785 Hyperlipidemia, unspecified: Secondary | ICD-10-CM | POA: Diagnosis not present

## 2021-08-28 DIAGNOSIS — I679 Cerebrovascular disease, unspecified: Secondary | ICD-10-CM | POA: Diagnosis not present

## 2021-08-28 DIAGNOSIS — U071 COVID-19: Secondary | ICD-10-CM | POA: Diagnosis not present

## 2021-08-30 ENCOUNTER — Other Ambulatory Visit: Payer: Self-pay | Admitting: *Deleted

## 2021-08-30 DIAGNOSIS — M5136 Other intervertebral disc degeneration, lumbar region: Secondary | ICD-10-CM | POA: Diagnosis not present

## 2021-08-30 DIAGNOSIS — I10 Essential (primary) hypertension: Secondary | ICD-10-CM | POA: Diagnosis not present

## 2021-08-30 DIAGNOSIS — Z8739 Personal history of other diseases of the musculoskeletal system and connective tissue: Secondary | ICD-10-CM | POA: Diagnosis not present

## 2021-08-30 DIAGNOSIS — I872 Venous insufficiency (chronic) (peripheral): Secondary | ICD-10-CM | POA: Diagnosis not present

## 2021-08-30 DIAGNOSIS — I679 Cerebrovascular disease, unspecified: Secondary | ICD-10-CM | POA: Diagnosis not present

## 2021-08-30 DIAGNOSIS — R260 Ataxic gait: Secondary | ICD-10-CM | POA: Diagnosis not present

## 2021-08-30 DIAGNOSIS — E785 Hyperlipidemia, unspecified: Secondary | ICD-10-CM | POA: Diagnosis not present

## 2021-08-30 DIAGNOSIS — K219 Gastro-esophageal reflux disease without esophagitis: Secondary | ICD-10-CM | POA: Diagnosis not present

## 2021-08-30 NOTE — Patient Outreach (Signed)
Member screened for [facility] Care Management needs. Per [place] Mr. Marvel Plan resides in [facility].   Communication sent to facility SW to inquire about transition plans.   Will continue to follow for potential [facility] care management needs.    Marthenia Rolling, MSN, RN,BSN [place] Acute Care Coordinator [phone] [facility]) [phone]  (Toll free office)

## 2021-08-31 ENCOUNTER — Other Ambulatory Visit: Payer: Self-pay | Admitting: *Deleted

## 2021-08-31 NOTE — Patient Outreach (Signed)
[place] Coordinator follow up. Member screened for potential [facility] Care Management needs.   Update received from [place]. Glen Barnett's care plan meeting is scheduled for today 08/31/21. Writer to follow up later time regarding transition plans and potential need for [facility] services.   Will continue to follow.  Marthenia Rolling, MSN, RN,BSN [place] Acute Care Coordinator [phone] [facility]) [phone]  (Toll free office)

## 2021-09-10 DIAGNOSIS — N189 Chronic kidney disease, unspecified: Secondary | ICD-10-CM | POA: Diagnosis not present

## 2021-09-10 DIAGNOSIS — E785 Hyperlipidemia, unspecified: Secondary | ICD-10-CM | POA: Diagnosis not present

## 2021-09-10 DIAGNOSIS — I10 Essential (primary) hypertension: Secondary | ICD-10-CM | POA: Diagnosis not present

## 2021-09-10 DIAGNOSIS — U071 COVID-19: Secondary | ICD-10-CM | POA: Diagnosis not present

## 2021-09-10 DIAGNOSIS — I69351 Hemiplegia and hemiparesis following cerebral infarction affecting right dominant side: Secondary | ICD-10-CM | POA: Diagnosis not present

## 2021-09-10 DIAGNOSIS — I679 Cerebrovascular disease, unspecified: Secondary | ICD-10-CM | POA: Diagnosis not present

## 2021-09-10 DIAGNOSIS — D649 Anemia, unspecified: Secondary | ICD-10-CM | POA: Diagnosis not present

## 2021-09-14 ENCOUNTER — Other Ambulatory Visit: Payer: Self-pay | Admitting: *Deleted

## 2021-09-14 NOTE — Patient Outreach (Signed)
[place] Coordinator follow up. Member screened for potential [facility] care coordination needs.  Communication sent to [facility] SW to inquire about transition plans.   Will continue to follow.    Marthenia Rolling, MSN, RN,BSN [place] Acute Care Coordinator [phone] [facility]) [phone]  (Toll free office)

## 2021-09-17 ENCOUNTER — Other Ambulatory Visit: Payer: Self-pay | Admitting: *Deleted

## 2021-09-17 NOTE — Patient Outreach (Addendum)
[place] Coordinator follow up. Mr. Glen Barnett resides in [facility].   Update received from SNF SW [place] stating Mr. Glen Barnett is transitioning to [facility] on 09/19/21.  No identifiable [facility] Care Management needs.    Marthenia Rolling, MSN, RN,BSN [place] Acute Care Coordinator [phone] [facility]) [phone]  (Toll free office)

## 2021-09-19 DIAGNOSIS — N189 Chronic kidney disease, unspecified: Secondary | ICD-10-CM | POA: Diagnosis not present

## 2021-09-19 DIAGNOSIS — I69351 Hemiplegia and hemiparesis following cerebral infarction affecting right dominant side: Secondary | ICD-10-CM | POA: Diagnosis not present

## 2021-09-19 DIAGNOSIS — Z8673 Personal history of transient ischemic attack (TIA), and cerebral infarction without residual deficits: Secondary | ICD-10-CM | POA: Diagnosis not present

## 2021-09-19 DIAGNOSIS — K219 Gastro-esophageal reflux disease without esophagitis: Secondary | ICD-10-CM | POA: Diagnosis not present

## 2021-09-19 DIAGNOSIS — I10 Essential (primary) hypertension: Secondary | ICD-10-CM | POA: Diagnosis not present

## 2021-09-19 DIAGNOSIS — E785 Hyperlipidemia, unspecified: Secondary | ICD-10-CM | POA: Diagnosis not present

## 2021-09-19 DIAGNOSIS — U071 COVID-19: Secondary | ICD-10-CM | POA: Diagnosis not present

## 2021-09-26 ENCOUNTER — Telehealth: Payer: Self-pay | Admitting: Adult Health

## 2021-09-26 ENCOUNTER — Encounter: Payer: Self-pay | Admitting: Adult Health

## 2021-09-26 ENCOUNTER — Other Ambulatory Visit: Payer: Self-pay

## 2021-09-26 ENCOUNTER — Ambulatory Visit (INDEPENDENT_AMBULATORY_CARE_PROVIDER_SITE_OTHER): Payer: Medicare Other | Admitting: Adult Health

## 2021-09-26 VITALS — BP 142/76 | HR 76 | Ht 71.0 in | Wt 198.0 lb

## 2021-09-26 DIAGNOSIS — I69322 Dysarthria following cerebral infarction: Secondary | ICD-10-CM

## 2021-09-26 DIAGNOSIS — I10 Essential (primary) hypertension: Secondary | ICD-10-CM | POA: Diagnosis not present

## 2021-09-26 DIAGNOSIS — I639 Cerebral infarction, unspecified: Secondary | ICD-10-CM

## 2021-09-26 DIAGNOSIS — E785 Hyperlipidemia, unspecified: Secondary | ICD-10-CM

## 2021-09-26 NOTE — Telephone Encounter (Signed)
Cardiac monitor shipped to prior SNF on 11/23. He is now residing at [facility] since 12/21. Cardiac monitor not completed during SNF stay and son does not believe this was sent with him when he left SNF. Would you be able to follow up on this? Thank you!

## 2021-09-26 NOTE — Progress Notes (Signed)
[facility] [address] [place]. [place] [address] [phone]       HOSPITAL FOLLOW UP NOTE  Mr. Glen Barnett Date of Birth:  05-04-1927 Medical Record Number:  [id number]   Reason for Referral:  hospital stroke follow up    SUBJECTIVE:   CHIEF COMPLAINT:  Chief Complaint  Patient presents with   Follow-up    Rm 2 with son Glen Barnett) here for hsp f/u. Reports he has been doing ok currently living at [facility] therapy visits will hopefully start soon.     HPI:   Glen Barnett is a 87 year old male with history of hypertension, hyperlipidemia, CKD, anemia who presented on 08/10/2021 with left facial weakness, slurred speech, and found down on the floor.  Personally reviewed hospitalization pertinent progress notes, lab work and imaging.  CT showed brain atrophy and right frontal parietal small cortical infarct.  MRI showed right MCA scattered infarcts.  CTA head and neck left P1/P2 high-grade stenosis.  EF 50 to 55%, UDS negative, A1c 5.8, LDL 159.  EEG no seizure activity.  Etiology embolic secondary to unclear source however concerning for occult A. fib.  Recommended 30-day cardiac event monitor and if negative, consider placement of loop recorder.  Recommended DAPT for 3 weeks and aspirin alone and initiate atorvastatin 40 mg daily. Evidence of mild rhabdo with CK 1105 subsequently improved with hydration.  Therapy evals but family preferred SNF for ongoing rehab with residual dysphagia, moderate dysarthria and gait impairment with functional decline. D/c'd to [facility] SNF on 11/17   Today, 09/26/2021, patient being seen for initial hospital follow-up accompanied by his son.  Now residing at [facility] since 12/21. Overall doing well.  Continue dysarthria but overall greatly improving.  Chronic gait impairment with imbalance still slightly worse than baseline but also improving. Use of cane (baseline) - no recent falls. Denies any  swallowing difficulties -maintaining regular diet without difficulty.  Has not yet restarted therapies but son working on this as it should have already been started. Denies new stroke/TIA symptoms. Reports short term memory loss but stable without worsening post stroke.   Completed 3 weeks DAPT -remains on aspirin alone as well as atorvastatin without side effects.  Blood pressure today 142/76. Has f/u visit with PCP 1/30 and cardiology 1/12.  Cardiac monitor mailed to [facility] SNF on 11/23 but was never completed per son (he was not aware monitor was sent).   No further concerns at this time     PERTINENT IMAGING  CTA head & neck area of low density in lateral right posterior frontal lobe, focal narrowing of left p1-p2 junction and no large vessel occlusion MRI  Acute right MCA branch infarcts affecting frontal and parietal cortex, significant atrophy 2D Echo no LV mural thrombus, EF 50-55%, calcification of aortic valve, no atrial level shunt Recommend 30-day cardiac event monitoring as outpatient to rule out A. fib.  If negative, may consider loop recorder. EEG negative LDL 159 HgbA1c 5.8    ROS:   14 system review of systems performed and negative with exception of those listed in HPI  PMH:  Past Medical History:  Diagnosis Date   Ataxia 2014/15   brain MRI 02/2014 showed encephalomalacia parietal and occip lobes c/w old infarcts, plus diffuse cerebral atrophy, o/w normal   Cataract    Chronic renal insufficiency, stage 2 (mild) 12/2016   GFR 60s-70s   CVA (cerebral vascular accident) ([place]) 07/2021   R MCA territory.  No obstruction/stenosis on CTA head and neck.  No a-fib in hosp.  Echo with low-normal LV fxn.  Outpt rhythm monitoring planned.  ASA + plavix x 3wks, then ASA alone.   DDD (degenerative disc disease)    L-spine   Dermatitis    ?Contact derm? per Glen Barnett Derm MD: Dr. Threasa Barnett (05/09/16): punch bx 06/18/16: chronic eczematous derm/contact derm, neg fungal  stain.  Allergy (RAST) testing NEG.   GERD (gastroesophageal reflux disease)    Heme positive stool 02/2012   colonoscopy normal.  EGD showed small hiatal hernia with associated Cameron's erosions.  Hemoccults NEG x 3 09/2014. Hem + iron def 07/2019->conservative mgmt.   Hyperlipidemia    Hypertension    Insomnia    poor sleep hygiene.  Clonidine for his HTN has helped a little.   Iron deficiency anemia 02/2012; 07/2019   2013 Mild iron deficiency->small upper GI erosions, colonoscopy normal.  07/2019->Hb 7.5, heme+, GI eval->pt chose no endoscopy->IV iron and Hb monitoring, PO iron indefinitely: Hb/iron normal on recheck 10/2019 and 12/2019.   Macular degeneration, age related    exudative R eye; nonexudative L eye.  Vitreous degeneration bilat.   Obesity (BMI 30-39.9)    Osteoarthritis, multiple sites    Retinal hemorrhage 2016   w/ retinal detachment (right eye) : anticoagulants stopped due to this   Venous insufficiency of both lower extremities     PSH:  Past Surgical History:  Procedure Laterality Date   APPENDECTOMY  09/30/1933   cataract surg     Bilateral/ 08-13   COLONOSCOPY  05/12/2012   for iron def anemia--NORMAL RESULT   ESOPHAGOGASTRODUODENOSCOPY  06/17/2012   Small hiatal hernia with associated Cameron's erosions--likely source of his mild iron def anemia.   NASAL SINUS SURGERY     1998   TONSILLECTOMY  09/30/1933   TRANSTHORACIC ECHOCARDIOGRAM  08/11/2021   2022->(in setting of acute CVA) EF 50-55%, no thrombus, WMA c/w ischemic CM vs stress-induced CM.  Valves ok.    Social History:  Social History   Socioeconomic History   Marital status: Widowed    Spouse name: Not on file   Number of children: Not on file   Years of education: Not on file   Highest education level: Not on file  Occupational History   Not on file  Tobacco Use   Smoking status: Never   Smokeless tobacco: Never  Vaping Use   Vaping Use: Never used  Substance and Sexual Activity    Alcohol use: No   Drug use: No   Sexual activity: Not Currently  Other Topics Concern   Not on file  Social History Narrative   Widower.  Wife died 2011/09/05 of brain cancer.   He relocated from [place] to live near son.   NO exercise.  No T/A/Ds.   Retired Chief Financial Officer.   Social Determinants of Health   Financial Resource Strain: Low Risk    Difficulty of Paying Living Expenses: Not hard at all  Food Insecurity: No Food Insecurity   Worried About Charity fundraiser in the Last Year: Never true   [place] in the Last Year: Never true  Transportation Needs: No Transportation Needs   Lack of Transportation (Medical): No   Lack of Transportation (Non-Medical): No  Physical Activity: Inactive   Days of Exercise per Week: 0 days   Minutes of Exercise per Session: 0 min  Stress: No Stress Concern Present   Feeling of Stress : Not at all  Social Connections: Socially Isolated   Frequency of Communication with Friends and Family: More than three times a week   Frequency of Social Gatherings with Friends and Family: More than three times a week   Attends Religious Services: Never   Marine scientist or Organizations: No   Attends Archivist Meetings: Never   Marital Status: Widowed  Human resources officer Violence: Not At Risk   Fear of Current or Ex-Partner: No   Emotionally Abused: No   Physically Abused: No   Sexually Abused: No    Family History:  Family History  Problem Relation Age of Onset   Liver cancer Mother     Medications:   Current Outpatient Medications on File Prior to Visit  Medication Sig Dispense Refill   Aflibercept (EYLEA IO) Inject into the eye. One injection into right eye every month     aspirin EC 81 MG EC tablet Take 1 tablet (81 mg total) by mouth daily. Swallow whole. 30 tablet 11   atorvastatin (LIPITOR) 40 MG tablet Take 1 tablet (40 mg total) by mouth daily.     Cholecalciferol (VITAMIN D3) 2000 UNITS TABS Take 1 tablet by  mouth daily.     cloNIDine (CATAPRES) 0.1 MG tablet TAKE 1 TABLET BY MOUTH TWICE A DAY 180 tablet 0   hydrochlorothiazide (HYDRODIURIL) 50 MG tablet Take 1 tablet (50 mg total) by mouth daily. 90 tablet 3   Multiple Vitamins-Minerals (PRESERVISION AREDS 2 PO) Take 1 tablet by mouth daily.     omeprazole (PRILOSEC) 40 MG capsule TAKE 1 CAPSULE BY MOUTH EVERY DAY (Patient taking differently: Take 40 mg by mouth daily.) 90 capsule 1   valsartan (DIOVAN) 320 MG tablet TAKE 1 TABLET BY MOUTH EVERY DAY (Patient taking differently: Take 320 mg by mouth daily.) 90 tablet 1   No current facility-administered medications on file prior to visit.    Allergies:  No Known Allergies    OBJECTIVE:  Physical Exam  Vitals:   09/26/21 1316  BP: (!) 142/76  Pulse: 76  SpO2: 96%  Weight: 198 lb (89.8 kg)  Height: 5\' 11"  (1.803 m)   Body mass index is 27.62 kg/m. No results found.  Post stroke PHQ 2/9 Depression screen PHQ 2/9 09/26/2021  Decreased Interest 0  Down, Depressed, Hopeless 0  PHQ - 2 Score 0     General: well developed, well nourished, very pleasant elderly Caucasian male, seated, in no evident distress Head: head normocephalic and atraumatic.   Neck: supple with no carotid or supraclavicular bruits Cardiovascular: regular rate and rhythm, no murmurs Musculoskeletal: no deformity Skin:  no rash/petichiae Vascular:  Normal pulses all extremities   Neurologic Exam Mental Status: Awake and fully alert.  Mild to moderate dysarthria.  No evidence of aphasia.  Follows commands without difficulty.  Oriented to place and time. Recent memory impaired and remote memory intact. Attention span, concentration and fund of knowledge appropriate during visit. Mood and affect appropriate.  Cranial Nerves: Fundoscopic exam reveals sharp disc margins. Pupils equal, briskly reactive to light. Extraocular movements full without nystagmus. Visual fields full to confrontation.  HOH bilaterally. Facial  sensation intact.  Left lower facial weakness.  Tongue, palate moves normally and symmetrically.  Motor: Normal bulk and tone. Normal strength in all tested extremity muscles Sensory.: intact to touch , pinprick , position and vibratory sensation.  Coordination: Rapid alternating movements normal in all extremities. Finger-to-nose and heel-to-shin performed accurately bilaterally. Gait and Station: Arises from chair without difficulty. Stance  is normal. Gait demonstrates decreased stride length and step height of LLE and mild imbalance/unsteadiness with use of cane.  Tandem walk and heel toe not attempted.  Reflexes: 1+ and symmetric. Toes downgoing.     NIHSS  2 Modified Rankin  3      ASSESSMENT: Glen Barnett is a 87 y.o. year old male with recent lateral right posterior frontal lobe infarct on 67/67/2094 possibly embolic secondary to unclear source concerning for occult A. fib. Vascular risk factors include HTN, HLD, history of prior stroke and advanced age.      PLAN:  Right posterior frontal lobe stroke:  Residual deficit: Dysarthria and left lower facial weakness. Restart therapies once able at ALF Will advise cardiology re: cardiac monitor being sent to prior SNF where he is no longer at. Needs to complete 30 day cardiac monitor and if negative, consider ILR placement to further evaluate for atrial fibrillation as potential etiology Continue aspirin 81 mg daily  and atorvastatin 40 mg daily for secondary stroke prevention.   Discussed secondary stroke prevention measures and importance of close PCP follow up for aggressive stroke risk factor management. I have gone over the pathophysiology of stroke, warning signs and symptoms, risk factors and their management in some detail with instructions to go to the closest emergency room for symptoms of concern. HTN: BP goal <130/90.  Stable on current regimen per PCP HLD: LDL goal <70. Recent LDL 159.  Continue atorvastatin 40  mg daily - request repeat lipid panel at f/u visit with PCP next month (not currently fasting)     Follow up in 4 months or call earlier if needed   CC:  [facility] provider: Dr. Leonie Man PCP: Tammi Sou, MD    I spent 57 minutes of face-to-face and non-face-to-face time with patient and son.  This included previsit chart review including review of recent hospitalization, lab review, study review, electronic health record documentation, patient and son education regarding recent stroke including potential etiology, indication for further cardiac monitoring, secondary stroke prevention measures and importance of managing stroke risk factors, residual deficits and typical recovery time and answered all other questions to patient and sons satisfaction  Frann Rider, AGNP-BC  [facility] [address] [place], [place] [address]  Phone [phone] Fax [phone] Note: This document was prepared with digital dictation and possible smart phrase technology. Any transcriptional errors that result from this process are unintentional.

## 2021-09-26 NOTE — Patient Instructions (Signed)
Restart therapies as soon as possible for hopeful ongoing recovery   Continue aspirin 81 mg daily  and atorvastatin 40 mg daily for secondary stroke prevention - request repeat lab work in the next 1-2 months to check cholesterol levels   Will follow up regarding cardiac monitor - if negative, would recommend longer monitoring to evaluate for possible atrial fibrillation with a loop recorder   Continue to follow up with PCP regarding cholesterol and blood pressure management  Maintain strict control of hypertension with blood pressure goal below 130/90 and cholesterol with LDL cholesterol (bad cholesterol) goal below 70 mg/dL.   Signs of a Stroke? Follow the BEFAST method:  Balance Watch for a sudden loss of balance, trouble with coordination or vertigo Eyes Is there a sudden loss of vision in one or both eyes? Or double vision?  Face: Ask the person to smile. Does one side of the face droop or is it numb?  Arms: Ask the person to raise both arms. Does one arm drift downward? Is there weakness or numbness of a leg? Speech: Ask the person to repeat a simple phrase. Does the speech sound slurred/strange? Is the person confused ? Time: If you observe any of these signs, call 911.     Followup in the future with me in 4 months or call earlier if needed       Thank you for coming to see [place] at [facility]. I hope we have been able to provide you high quality care today.  You may receive a patient satisfaction survey over the next few weeks. We would appreciate your feedback and comments so that we may continue to improve ourselves and the health of our patients.   Stroke Prevention Some medical conditions and lifestyle choices can lead to a higher risk for a stroke. You can help to prevent a stroke by eating healthy foods and exercising. It also helps to not smoke and to manage any health problems you may have. How can this condition affect me? A stroke is an  emergency. It should be treated right away. A stroke can lead to brain damage or threaten your life. There is a better chance of surviving and getting better after a stroke if you get medical help right away. What can increase my risk? The following medical conditions may increase your risk of a stroke: Diseases of the heart and blood vessels (cardiovascular disease). High blood pressure (hypertension). Diabetes. High cholesterol. Sickle cell disease. Problems with blood clotting. Being very overweight. Sleeping problems (obstructivesleep apnea). Other risk factors include: Being older than age 68. A history of blood clots, stroke, or mini-stroke (TIA). Race, ethnic background, or a family history of stroke. Smoking or using tobacco products. Taking birth control pills, especially if you smoke. Heavy alcohol and drug use. Not being active. What actions can I take to prevent this? Manage your health conditions High cholesterol. Eat a healthy diet. If this is not enough to manage your cholesterol, you may need to take medicines. Take medicines as told by your doctor. High blood pressure. Try to keep your blood pressure below 130/80. If your blood pressure cannot be managed through a healthy diet and regular exercise, you may need to take medicines. Take medicines as told by your doctor. Ask your doctor if you should check your blood pressure at home. Have your blood pressure checked every year. Diabetes. Eat a healthy diet and get regular exercise. If your blood sugar (glucose) cannot be managed through diet  and exercise, you may need to take medicines. Take medicines as told by your doctor. Talk to your doctor about getting checked for sleeping problems. Signs of a problem can include: Snoring a lot. Feeling very tired. Make sure that you manage any other conditions you have. Nutrition  Follow instructions from your doctor about what to eat or drink. You may be told to: Eat and  drink fewer calories each day. Limit how much salt (sodium) you use to 1,500 milligrams (mg) each day. Use only healthy fats for cooking, such as olive oil, canola oil, and sunflower oil. Eat healthy foods. To do this: Choose foods that are high in fiber. These include whole grains, and fresh fruits and vegetables. Eat at least 5 servings of fruits and vegetables a day. Try to fill one-half of your plate with fruits and vegetables at each meal. Choose low-fat (lean) proteins. These include low-fat cuts of meat, chicken without skin, fish, tofu, beans, and nuts. Eat low-fat dairy products. Avoid foods that: Are high in salt. Have saturated fat. Have trans fat. Have cholesterol. Are processed or pre-made. Count how many carbohydrates you eat and drink each day. Lifestyle If you drink alcohol: Limit how much you have to: 0-1 drink a day for women who are not pregnant. 0-2 drinks a day for men. Know how much alcohol is in your drink. In the [place] one drink equals one 12 oz bottle of beer (324mL), one 5 oz glass of wine (19mL), or one 1 oz glass of hard liquor (48mL). Do not smoke or use any products that have nicotine or tobacco. If you need help quitting, ask your doctor. Avoid secondhand smoke. Do not use drugs. Activity  Try to stay at a healthy weight. Get at least 30 minutes of exercise on most days, such as: Fast walking. Biking. Swimming. Medicines Take over-the-counter and prescription medicines only as told by your doctor. Avoid taking birth control pills. Talk to your doctor about the risks of taking birth control pills if: You are over 53 years old. You smoke. You get very bad headaches. You have had a blood clot. Where to find more information American Stroke Association: www.strokeassociation.org Get help right away if: You or a loved one has any signs of a stroke. "BE FAST" is an easy way to remember the warning signs: B - Balance. Dizziness, sudden trouble  walking, or loss of balance. E - Eyes. Trouble seeing or a change in how you see. F - Face. Sudden weakness or loss of feeling of the face. The face or eyelid may droop on one side. A - Arms. Weakness or loss of feeling in an arm. This happens all of a sudden and most often on one side of the body. S - Speech. Sudden trouble speaking, slurred speech, or trouble understanding what people say. T - Time. Time to call emergency services. Write down what time symptoms started. You or a loved one has other signs of a stroke, such as: A sudden, very bad headache with no known cause. Feeling like you may vomit (nausea). Vomiting. A seizure. These symptoms may be an emergency. Get help right away. Call your local emergency services (911 in the [place] Do not wait to see if the symptoms will go away. Do not drive yourself to the hospital. Summary You can help to prevent a stroke by eating healthy, exercising, and not smoking. It also helps to manage any health problems you have. Do not smoke or use any products that  contain nicotine or tobacco. Get help right away if you or a loved one has any signs of a stroke. This information is not intended to replace advice given to you by your health care provider. Make sure you discuss any questions you have with your health care provider. Document Revised: 04/17/2020 Document Reviewed: 04/17/2020 Elsevier Patient Education  [place].

## 2021-09-27 ENCOUNTER — Other Ambulatory Visit: Payer: Self-pay | Admitting: Student

## 2021-09-27 DIAGNOSIS — H353122 Nonexudative age-related macular degeneration, left eye, intermediate dry stage: Secondary | ICD-10-CM | POA: Diagnosis not present

## 2021-09-27 DIAGNOSIS — I639 Cerebral infarction, unspecified: Secondary | ICD-10-CM

## 2021-09-27 DIAGNOSIS — H35431 Paving stone degeneration of retina, right eye: Secondary | ICD-10-CM | POA: Diagnosis not present

## 2021-09-27 DIAGNOSIS — I4891 Unspecified atrial fibrillation: Secondary | ICD-10-CM

## 2021-09-27 DIAGNOSIS — H353211 Exudative age-related macular degeneration, right eye, with active choroidal neovascularization: Secondary | ICD-10-CM | POA: Diagnosis not present

## 2021-09-27 DIAGNOSIS — H43813 Vitreous degeneration, bilateral: Secondary | ICD-10-CM | POA: Diagnosis not present

## 2021-09-27 NOTE — Telephone Encounter (Signed)
Son will bring patient in Wednesday, 10/03/20 at 9:30 AM to have cardiac event monitor applied. Patient may consider rescheduling monitor follow up appoint with Dr. Johney Frame on 10/11/20 to a later date.

## 2021-09-28 DIAGNOSIS — I872 Venous insufficiency (chronic) (peripheral): Secondary | ICD-10-CM | POA: Diagnosis not present

## 2021-09-28 DIAGNOSIS — L259 Unspecified contact dermatitis, unspecified cause: Secondary | ICD-10-CM | POA: Diagnosis not present

## 2021-09-28 DIAGNOSIS — R4781 Slurred speech: Secondary | ICD-10-CM | POA: Diagnosis not present

## 2021-09-28 DIAGNOSIS — N189 Chronic kidney disease, unspecified: Secondary | ICD-10-CM | POA: Diagnosis not present

## 2021-09-28 DIAGNOSIS — Z9181 History of falling: Secondary | ICD-10-CM | POA: Diagnosis not present

## 2021-09-28 DIAGNOSIS — I69398 Other sequelae of cerebral infarction: Secondary | ICD-10-CM | POA: Diagnosis not present

## 2021-09-28 DIAGNOSIS — Z7982 Long term (current) use of aspirin: Secondary | ICD-10-CM | POA: Diagnosis not present

## 2021-09-28 DIAGNOSIS — D509 Iron deficiency anemia, unspecified: Secondary | ICD-10-CM | POA: Diagnosis not present

## 2021-09-28 DIAGNOSIS — I69320 Aphasia following cerebral infarction: Secondary | ICD-10-CM | POA: Diagnosis not present

## 2021-09-28 DIAGNOSIS — M6282 Rhabdomyolysis: Secondary | ICD-10-CM | POA: Diagnosis not present

## 2021-09-28 DIAGNOSIS — I69393 Ataxia following cerebral infarction: Secondary | ICD-10-CM | POA: Diagnosis not present

## 2021-09-28 DIAGNOSIS — M1990 Unspecified osteoarthritis, unspecified site: Secondary | ICD-10-CM | POA: Diagnosis not present

## 2021-09-28 DIAGNOSIS — Z7902 Long term (current) use of antithrombotics/antiplatelets: Secondary | ICD-10-CM | POA: Diagnosis not present

## 2021-09-28 DIAGNOSIS — E785 Hyperlipidemia, unspecified: Secondary | ICD-10-CM | POA: Diagnosis not present

## 2021-09-28 DIAGNOSIS — G4700 Insomnia, unspecified: Secondary | ICD-10-CM | POA: Diagnosis not present

## 2021-09-28 DIAGNOSIS — K219 Gastro-esophageal reflux disease without esophagitis: Secondary | ICD-10-CM | POA: Diagnosis not present

## 2021-09-28 DIAGNOSIS — H3530 Unspecified macular degeneration: Secondary | ICD-10-CM | POA: Diagnosis not present

## 2021-09-28 DIAGNOSIS — I129 Hypertensive chronic kidney disease with stage 1 through stage 4 chronic kidney disease, or unspecified chronic kidney disease: Secondary | ICD-10-CM | POA: Diagnosis not present

## 2021-09-28 DIAGNOSIS — D631 Anemia in chronic kidney disease: Secondary | ICD-10-CM | POA: Diagnosis not present

## 2021-09-28 DIAGNOSIS — M5136 Other intervertebral disc degeneration, lumbar region: Secondary | ICD-10-CM | POA: Diagnosis not present

## 2021-09-28 DIAGNOSIS — Z9089 Acquired absence of other organs: Secondary | ICD-10-CM | POA: Diagnosis not present

## 2021-09-28 DIAGNOSIS — I69392 Facial weakness following cerebral infarction: Secondary | ICD-10-CM | POA: Diagnosis not present

## 2021-10-01 NOTE — Progress Notes (Signed)
I agree with the above plan 

## 2021-10-02 ENCOUNTER — Other Ambulatory Visit: Payer: Self-pay | Admitting: Family Medicine

## 2021-10-02 DIAGNOSIS — I69393 Ataxia following cerebral infarction: Secondary | ICD-10-CM | POA: Diagnosis not present

## 2021-10-02 DIAGNOSIS — I69320 Aphasia following cerebral infarction: Secondary | ICD-10-CM | POA: Diagnosis not present

## 2021-10-02 DIAGNOSIS — I10 Essential (primary) hypertension: Secondary | ICD-10-CM

## 2021-10-02 DIAGNOSIS — I69392 Facial weakness following cerebral infarction: Secondary | ICD-10-CM | POA: Diagnosis not present

## 2021-10-02 DIAGNOSIS — I129 Hypertensive chronic kidney disease with stage 1 through stage 4 chronic kidney disease, or unspecified chronic kidney disease: Secondary | ICD-10-CM | POA: Diagnosis not present

## 2021-10-02 DIAGNOSIS — R4781 Slurred speech: Secondary | ICD-10-CM | POA: Diagnosis not present

## 2021-10-02 DIAGNOSIS — I69398 Other sequelae of cerebral infarction: Secondary | ICD-10-CM | POA: Diagnosis not present

## 2021-10-03 ENCOUNTER — Encounter: Payer: Self-pay | Admitting: *Deleted

## 2021-10-03 ENCOUNTER — Other Ambulatory Visit: Payer: Self-pay

## 2021-10-03 ENCOUNTER — Ambulatory Visit (INDEPENDENT_AMBULATORY_CARE_PROVIDER_SITE_OTHER): Payer: Medicare Other

## 2021-10-03 ENCOUNTER — Other Ambulatory Visit: Payer: Self-pay | Admitting: Student

## 2021-10-03 DIAGNOSIS — I4891 Unspecified atrial fibrillation: Secondary | ICD-10-CM

## 2021-10-03 DIAGNOSIS — I639 Cerebral infarction, unspecified: Secondary | ICD-10-CM

## 2021-10-03 DIAGNOSIS — I69398 Other sequelae of cerebral infarction: Secondary | ICD-10-CM | POA: Diagnosis not present

## 2021-10-03 DIAGNOSIS — I69393 Ataxia following cerebral infarction: Secondary | ICD-10-CM | POA: Diagnosis not present

## 2021-10-03 DIAGNOSIS — I69320 Aphasia following cerebral infarction: Secondary | ICD-10-CM | POA: Diagnosis not present

## 2021-10-03 DIAGNOSIS — I129 Hypertensive chronic kidney disease with stage 1 through stage 4 chronic kidney disease, or unspecified chronic kidney disease: Secondary | ICD-10-CM | POA: Diagnosis not present

## 2021-10-03 DIAGNOSIS — R4781 Slurred speech: Secondary | ICD-10-CM | POA: Diagnosis not present

## 2021-10-03 DIAGNOSIS — I69392 Facial weakness following cerebral infarction: Secondary | ICD-10-CM | POA: Diagnosis not present

## 2021-10-03 NOTE — Progress Notes (Unsigned)
[id number]  14 day ZIO AT applied from office inventory. Order changed from cardiac event monitor to Long term monitor Live Telemetry by Frann Rider, NP.   Patients family states daily maintenance of cardiac event monitor would not be possible due to patients circumstances.

## 2021-10-04 DIAGNOSIS — I639 Cerebral infarction, unspecified: Secondary | ICD-10-CM | POA: Diagnosis not present

## 2021-10-04 DIAGNOSIS — I4891 Unspecified atrial fibrillation: Secondary | ICD-10-CM | POA: Diagnosis not present

## 2021-10-09 DIAGNOSIS — I69392 Facial weakness following cerebral infarction: Secondary | ICD-10-CM | POA: Diagnosis not present

## 2021-10-09 DIAGNOSIS — I69320 Aphasia following cerebral infarction: Secondary | ICD-10-CM | POA: Diagnosis not present

## 2021-10-09 DIAGNOSIS — I69393 Ataxia following cerebral infarction: Secondary | ICD-10-CM | POA: Diagnosis not present

## 2021-10-09 DIAGNOSIS — I129 Hypertensive chronic kidney disease with stage 1 through stage 4 chronic kidney disease, or unspecified chronic kidney disease: Secondary | ICD-10-CM | POA: Diagnosis not present

## 2021-10-09 DIAGNOSIS — I69398 Other sequelae of cerebral infarction: Secondary | ICD-10-CM | POA: Diagnosis not present

## 2021-10-09 DIAGNOSIS — R4781 Slurred speech: Secondary | ICD-10-CM | POA: Diagnosis not present

## 2021-10-10 DIAGNOSIS — I69398 Other sequelae of cerebral infarction: Secondary | ICD-10-CM | POA: Diagnosis not present

## 2021-10-10 DIAGNOSIS — I69320 Aphasia following cerebral infarction: Secondary | ICD-10-CM | POA: Diagnosis not present

## 2021-10-10 DIAGNOSIS — R4781 Slurred speech: Secondary | ICD-10-CM | POA: Diagnosis not present

## 2021-10-10 DIAGNOSIS — I69392 Facial weakness following cerebral infarction: Secondary | ICD-10-CM | POA: Diagnosis not present

## 2021-10-10 DIAGNOSIS — I129 Hypertensive chronic kidney disease with stage 1 through stage 4 chronic kidney disease, or unspecified chronic kidney disease: Secondary | ICD-10-CM | POA: Diagnosis not present

## 2021-10-10 DIAGNOSIS — I69393 Ataxia following cerebral infarction: Secondary | ICD-10-CM | POA: Diagnosis not present

## 2021-10-11 ENCOUNTER — Ambulatory Visit: Payer: Medicare Other | Admitting: Cardiology

## 2021-10-11 DIAGNOSIS — I129 Hypertensive chronic kidney disease with stage 1 through stage 4 chronic kidney disease, or unspecified chronic kidney disease: Secondary | ICD-10-CM | POA: Diagnosis not present

## 2021-10-11 DIAGNOSIS — I69398 Other sequelae of cerebral infarction: Secondary | ICD-10-CM | POA: Diagnosis not present

## 2021-10-11 DIAGNOSIS — I69392 Facial weakness following cerebral infarction: Secondary | ICD-10-CM | POA: Diagnosis not present

## 2021-10-11 DIAGNOSIS — R4781 Slurred speech: Secondary | ICD-10-CM | POA: Diagnosis not present

## 2021-10-11 DIAGNOSIS — I69320 Aphasia following cerebral infarction: Secondary | ICD-10-CM | POA: Diagnosis not present

## 2021-10-11 DIAGNOSIS — I69393 Ataxia following cerebral infarction: Secondary | ICD-10-CM | POA: Diagnosis not present

## 2021-10-16 DIAGNOSIS — I69320 Aphasia following cerebral infarction: Secondary | ICD-10-CM | POA: Diagnosis not present

## 2021-10-16 DIAGNOSIS — I69393 Ataxia following cerebral infarction: Secondary | ICD-10-CM | POA: Diagnosis not present

## 2021-10-16 DIAGNOSIS — R4781 Slurred speech: Secondary | ICD-10-CM | POA: Diagnosis not present

## 2021-10-16 DIAGNOSIS — I69392 Facial weakness following cerebral infarction: Secondary | ICD-10-CM | POA: Diagnosis not present

## 2021-10-16 DIAGNOSIS — I69398 Other sequelae of cerebral infarction: Secondary | ICD-10-CM | POA: Diagnosis not present

## 2021-10-16 DIAGNOSIS — I129 Hypertensive chronic kidney disease with stage 1 through stage 4 chronic kidney disease, or unspecified chronic kidney disease: Secondary | ICD-10-CM | POA: Diagnosis not present

## 2021-10-17 DIAGNOSIS — R4781 Slurred speech: Secondary | ICD-10-CM | POA: Diagnosis not present

## 2021-10-17 DIAGNOSIS — I69320 Aphasia following cerebral infarction: Secondary | ICD-10-CM | POA: Diagnosis not present

## 2021-10-17 DIAGNOSIS — I69393 Ataxia following cerebral infarction: Secondary | ICD-10-CM | POA: Diagnosis not present

## 2021-10-17 DIAGNOSIS — I69392 Facial weakness following cerebral infarction: Secondary | ICD-10-CM | POA: Diagnosis not present

## 2021-10-17 DIAGNOSIS — I129 Hypertensive chronic kidney disease with stage 1 through stage 4 chronic kidney disease, or unspecified chronic kidney disease: Secondary | ICD-10-CM | POA: Diagnosis not present

## 2021-10-17 DIAGNOSIS — I69398 Other sequelae of cerebral infarction: Secondary | ICD-10-CM | POA: Diagnosis not present

## 2021-10-19 DIAGNOSIS — I69393 Ataxia following cerebral infarction: Secondary | ICD-10-CM | POA: Diagnosis not present

## 2021-10-19 DIAGNOSIS — R4781 Slurred speech: Secondary | ICD-10-CM | POA: Diagnosis not present

## 2021-10-19 DIAGNOSIS — I129 Hypertensive chronic kidney disease with stage 1 through stage 4 chronic kidney disease, or unspecified chronic kidney disease: Secondary | ICD-10-CM | POA: Diagnosis not present

## 2021-10-19 DIAGNOSIS — I69398 Other sequelae of cerebral infarction: Secondary | ICD-10-CM | POA: Diagnosis not present

## 2021-10-19 DIAGNOSIS — I69392 Facial weakness following cerebral infarction: Secondary | ICD-10-CM | POA: Diagnosis not present

## 2021-10-19 DIAGNOSIS — I69320 Aphasia following cerebral infarction: Secondary | ICD-10-CM | POA: Diagnosis not present

## 2021-10-23 DIAGNOSIS — I69392 Facial weakness following cerebral infarction: Secondary | ICD-10-CM | POA: Diagnosis not present

## 2021-10-23 DIAGNOSIS — I69393 Ataxia following cerebral infarction: Secondary | ICD-10-CM | POA: Diagnosis not present

## 2021-10-23 DIAGNOSIS — I129 Hypertensive chronic kidney disease with stage 1 through stage 4 chronic kidney disease, or unspecified chronic kidney disease: Secondary | ICD-10-CM | POA: Diagnosis not present

## 2021-10-23 DIAGNOSIS — I69398 Other sequelae of cerebral infarction: Secondary | ICD-10-CM | POA: Diagnosis not present

## 2021-10-23 DIAGNOSIS — R4781 Slurred speech: Secondary | ICD-10-CM | POA: Diagnosis not present

## 2021-10-23 DIAGNOSIS — I69320 Aphasia following cerebral infarction: Secondary | ICD-10-CM | POA: Diagnosis not present

## 2021-10-24 DIAGNOSIS — I129 Hypertensive chronic kidney disease with stage 1 through stage 4 chronic kidney disease, or unspecified chronic kidney disease: Secondary | ICD-10-CM | POA: Diagnosis not present

## 2021-10-24 DIAGNOSIS — I69320 Aphasia following cerebral infarction: Secondary | ICD-10-CM | POA: Diagnosis not present

## 2021-10-24 DIAGNOSIS — I69392 Facial weakness following cerebral infarction: Secondary | ICD-10-CM | POA: Diagnosis not present

## 2021-10-24 DIAGNOSIS — I69398 Other sequelae of cerebral infarction: Secondary | ICD-10-CM | POA: Diagnosis not present

## 2021-10-24 DIAGNOSIS — R4781 Slurred speech: Secondary | ICD-10-CM | POA: Diagnosis not present

## 2021-10-24 DIAGNOSIS — I69393 Ataxia following cerebral infarction: Secondary | ICD-10-CM | POA: Diagnosis not present

## 2021-10-25 DIAGNOSIS — I69392 Facial weakness following cerebral infarction: Secondary | ICD-10-CM | POA: Diagnosis not present

## 2021-10-25 DIAGNOSIS — I129 Hypertensive chronic kidney disease with stage 1 through stage 4 chronic kidney disease, or unspecified chronic kidney disease: Secondary | ICD-10-CM | POA: Diagnosis not present

## 2021-10-25 DIAGNOSIS — I69320 Aphasia following cerebral infarction: Secondary | ICD-10-CM | POA: Diagnosis not present

## 2021-10-25 DIAGNOSIS — R4781 Slurred speech: Secondary | ICD-10-CM | POA: Diagnosis not present

## 2021-10-25 DIAGNOSIS — I69398 Other sequelae of cerebral infarction: Secondary | ICD-10-CM | POA: Diagnosis not present

## 2021-10-25 DIAGNOSIS — I69393 Ataxia following cerebral infarction: Secondary | ICD-10-CM | POA: Diagnosis not present

## 2021-10-27 NOTE — Progress Notes (Deleted)
Cardiology Office Note:    Date:  10/27/2021   ID:  Henry Russel, DOB [birth year], MRN [id number]  PCP:  Tammi Sou, MD   [facility] Providers Cardiologist:  None {   Referring MD: Tammi Sou, MD    History of Present Illness:    Glen Barnett is a 88 y.o. male with a hx of recent right MCA stroke, HTN, and GERD who was referred to clinic by Dr. Anitra Lauth for further management of stroke.  Patient was admitted from 08/10/21-08/16/21 where he presented after a fall where he was found to have slurred speech, aphasia, left sided facial droop. MRI showed right MCA territory infarcts. TTE 11/12 with EF 50-55%, moderate LVH, normal RV, mild AR, mild AS. EEG without seizures.   Past Medical History:  Diagnosis Date   Ataxia 2014/15   brain MRI 02/2014 showed encephalomalacia parietal and occip lobes c/w old infarcts, plus diffuse cerebral atrophy, o/w normal   Cataract    Chronic renal insufficiency, stage 2 (mild) 12/2016   GFR 60s-70s   CVA (cerebral vascular accident) ([place]) 07/2021   R MCA territory. No obstruction/stenosis on CTA head and neck.  No a-fib in hosp.  Echo with low-normal LV fxn.  Outpt rhythm monitoring planned.  ASA + plavix x 3wks, then ASA alone.   DDD (degenerative disc disease)    L-spine   Dermatitis    ?Contact derm? per [facility] D[facility] MD: Dr. Threasa Alpha (05/09/16): punch bx 06/18/16: chronic eczematous derm/contact derm, neg fungal stain.  Allergy (RAST) testing NEG.   GERD (gastroesophageal reflux disease)    Heme positive stool 02/2012   colonoscopy normal.  EGD showed small hiatal hernia with associated Cameron's erosions.  Hemoccults NEG x 3 09/2014. Hem + iron def 07/2019->conservative mgmt.   Hyperlipidemia    Hypertension    Insomnia    poor sleep hygiene.  Clonidine for his HTN has helped a little.   Iron deficiency anemia 02/2012; 07/2019   2013 Mild iron deficiency->small upper GI erosions, colonoscopy normal.   07/2019->Hb 7.5, heme+, GI eval->pt chose no endoscopy->IV iron and Hb monitoring, PO iron indefinitely: Hb/iron normal on recheck 10/2019 and 12/2019.   Macular degeneration, age related    exudative R eye; nonexudative L eye.  Vitreous degeneration bilat.   Obesity (BMI 30-39.9)    Osteoarthritis, multiple sites    Retinal hemorrhage 2016   w/ retinal detachment (right eye) : anticoagulants stopped due to this   Venous insufficiency of both lower extremities     Past Surgical History:  Procedure Laterality Date   APPENDECTOMY  09/30/1933   cataract surg     Bilateral/ 08-13   COLONOSCOPY  05/12/2012   for iron def anemia--NORMAL RESULT   ESOPHAGOGASTRODUODENOSCOPY  06/17/2012   Small hiatal hernia with associated Cameron's erosions--likely source of his mild iron def anemia.   NASAL SINUS SURGERY     1998   TONSILLECTOMY  09/30/1933   TRANSTHORACIC ECHOCARDIOGRAM  08/11/2021   2022->(in setting of acute CVA) EF 50-55%, no thrombus, WMA c/w ischemic CM vs stress-induced CM.  Valves ok.    Current Medications: No outpatient medications have been marked as taking for the 10/31/21 encounter (Appointment) with Freada Bergeron, MD.     Allergies:   Patient has no known allergies.   Social History   Socioeconomic History   Marital status: Widowed    Spouse name: Not on file   Number of children: Not on file  Years of education: Not on file   Highest education level: Not on file  Occupational History   Not on file  Tobacco Use   Smoking status: Never   Smokeless tobacco: Never  Vaping Use   Vaping Use: Never used  Substance and Sexual Activity   Alcohol use: No   Drug use: No   Sexual activity: Not Currently  Other Topics Concern   Not on file  Social History Narrative   Widower.  Wife died 08-26-11 of brain cancer.   He relocated from [place] to live near son.   NO exercise.  No T/A/Ds.   Retired Chief Financial Officer.   Social Determinants of Health   Financial  Resource Strain: Low Risk    Difficulty of Paying Living Expenses: Not hard at all  Food Insecurity: No Food Insecurity   Worried About Charity fundraiser in the Last Year: Never true   [place] in the Last Year: Never true  Transportation Needs: No Transportation Needs   Lack of Transportation (Medical): No   Lack of Transportation (Non-Medical): No  Physical Activity: Inactive   Days of Exercise per Week: 0 days   Minutes of Exercise per Session: 0 min  Stress: No Stress Concern Present   Feeling of Stress : Not at all  Social Connections: Socially Isolated   Frequency of Communication with Friends and Family: More than three times a week   Frequency of Social Gatherings with Friends and Family: More than three times a week   Attends Religious Services: Never   Marine scientist or Organizations: No   Attends Archivist Meetings: Never   Marital Status: Widowed     Family History: The patient's ***family history includes Liver cancer in his mother.  ROS:   Please see the history of present illness.    *** All other systems reviewed and are negative.  EKGs/Labs/Other Studies Reviewed:    The following studies were reviewed today: TTE 21-Aug-2021: IMPRESSIONS     1. No LV mural thrombus observed with Definity contrast. Wall motion  abnormalities are suggestive of ischemic cardiomyopathy although stress  induced cardiomyopathy is not excluded.   2. Left ventricular ejection fraction, by estimation, is 50 to 55%. The  left ventricle has low normal function. The left ventricle demonstrates  regional wall motion abnormalities (see scoring diagram/findings for  description). There is moderate  concentric left ventricular hypertrophy. Left ventricular diastolic  parameters are indeterminate.   3. Right ventricular systolic function is normal. The right ventricular  size is normal. Tricuspid regurgitation signal is inadequate for assessing  PA pressure.    4. The mitral valve is grossly normal. Trivial mitral valve  regurgitation.   5. The aortic valve is tricuspid with significantly restricted motion of  the noncoronary cusp. There is moderate calcification of the aortic valve.  Aortic valve regurgitation is mild. Mild aortic valve stenosis. Aortic  valve mean gradient measures 8.0  mmHg. Dimentionless index 0.42.   6. The inferior vena cava is normal in size with greater than 50%  respiratory variability, suggesting right atrial pressure of 3 mmHg.  EKG:  EKG is *** ordered today.  The ekg ordered today demonstrates ***  Recent Labs: 08-21-21: TSH 3.260 08/13/2021: ALT 21 08/25/21: BUN 20; Creatinine, Ser 0.70; Hemoglobin 13.1; Magnesium 2.0; Platelets 304; Potassium 3.9; Sodium 133  Recent Lipid Panel    Component Value Date/Time   CHOL 243 (H) 2021/08/21 0339   TRIG 81 08/21/2021 0339  HDL 68 08/11/2021 0339   CHOLHDL 3.6 08/11/2021 0339   VLDL 16 08/11/2021 0339   LDLCALC 159 (H) 08/11/2021 0339   LDLDIRECT 112.5 09/11/2011 1218     Risk Assessment/Calculations:   {Does this patient have ATRIAL FIBRILLATION?[phone]       Physical Exam:    VS:  There were no vitals taken for this visit.    Wt Readings from Last 3 Encounters:  09/26/21 198 lb (89.8 kg)  07/26/21 195 lb 3.2 oz (88.5 kg)  04/26/21 194 lb 12.8 oz (88.4 kg)     GEN: *** Well nourished, well developed in no acute distress HEENT: Normal NECK: No JVD; No carotid bruits LYMPHATICS: No lymphadenopathy CARDIAC: ***RRR, no murmurs, rubs, gallops RESPIRATORY:  Clear to auscultation without rales, wheezing or rhonchi  ABDOMEN: Soft, non-tender, non-distended MUSCULOSKELETAL:  No edema; No deformity  SKIN: Warm and dry NEUROLOGIC:  Alert and oriented x 3 PSYCHIATRIC:  Normal affect   ASSESSMENT:    No diagnosis found. PLAN:    In order of problems listed above:  #Right MCA Stroke: Admitted to [facility] hospital 07/2021 with fall and slurred  speech, aphasia and left sided weakness found to have right MCA stroke. TTE with EF 50-55%, mild AS, mild AR, normal RV. Cardiac monitor -Continue ASA 81mg  daily -Continue lipitor 40mg  daily  #HTN: -Continue valsartan 320mg  daily -Continue HCTZ 50mg  daily -?clonidine 0.1mg  BID  #HLD: -Continue lipitor 40mg  daily      {Are you ordering a CV Procedure (e.g. stress test, cath, DCCV, TEE, etc)?   Press F2        :[id number]}    Medication Adjustments/Labs and Tests Ordered: Current medicines are reviewed at length with the patient today.  Concerns regarding medicines are outlined above.  No orders of the defined types were placed in this encounter.  No orders of the defined types were placed in this encounter.   There are no Patient Instructions on file for this visit.   Signed, Freada Bergeron, MD  10/27/2021 9:02 PM    [place]

## 2021-10-28 DIAGNOSIS — M6282 Rhabdomyolysis: Secondary | ICD-10-CM | POA: Diagnosis not present

## 2021-10-28 DIAGNOSIS — I69393 Ataxia following cerebral infarction: Secondary | ICD-10-CM | POA: Diagnosis not present

## 2021-10-28 DIAGNOSIS — M1990 Unspecified osteoarthritis, unspecified site: Secondary | ICD-10-CM | POA: Diagnosis not present

## 2021-10-28 DIAGNOSIS — D631 Anemia in chronic kidney disease: Secondary | ICD-10-CM | POA: Diagnosis not present

## 2021-10-28 DIAGNOSIS — N189 Chronic kidney disease, unspecified: Secondary | ICD-10-CM | POA: Diagnosis not present

## 2021-10-28 DIAGNOSIS — M5136 Other intervertebral disc degeneration, lumbar region: Secondary | ICD-10-CM | POA: Diagnosis not present

## 2021-10-28 DIAGNOSIS — Z7902 Long term (current) use of antithrombotics/antiplatelets: Secondary | ICD-10-CM | POA: Diagnosis not present

## 2021-10-28 DIAGNOSIS — G4700 Insomnia, unspecified: Secondary | ICD-10-CM | POA: Diagnosis not present

## 2021-10-28 DIAGNOSIS — I872 Venous insufficiency (chronic) (peripheral): Secondary | ICD-10-CM | POA: Diagnosis not present

## 2021-10-28 DIAGNOSIS — Z7982 Long term (current) use of aspirin: Secondary | ICD-10-CM | POA: Diagnosis not present

## 2021-10-28 DIAGNOSIS — I129 Hypertensive chronic kidney disease with stage 1 through stage 4 chronic kidney disease, or unspecified chronic kidney disease: Secondary | ICD-10-CM | POA: Diagnosis not present

## 2021-10-28 DIAGNOSIS — Z9089 Acquired absence of other organs: Secondary | ICD-10-CM | POA: Diagnosis not present

## 2021-10-28 DIAGNOSIS — H3530 Unspecified macular degeneration: Secondary | ICD-10-CM | POA: Diagnosis not present

## 2021-10-28 DIAGNOSIS — Z9181 History of falling: Secondary | ICD-10-CM | POA: Diagnosis not present

## 2021-10-28 DIAGNOSIS — I69398 Other sequelae of cerebral infarction: Secondary | ICD-10-CM | POA: Diagnosis not present

## 2021-10-28 DIAGNOSIS — R4781 Slurred speech: Secondary | ICD-10-CM | POA: Diagnosis not present

## 2021-10-28 DIAGNOSIS — L259 Unspecified contact dermatitis, unspecified cause: Secondary | ICD-10-CM | POA: Diagnosis not present

## 2021-10-28 DIAGNOSIS — I69320 Aphasia following cerebral infarction: Secondary | ICD-10-CM | POA: Diagnosis not present

## 2021-10-28 DIAGNOSIS — D509 Iron deficiency anemia, unspecified: Secondary | ICD-10-CM | POA: Diagnosis not present

## 2021-10-28 DIAGNOSIS — E785 Hyperlipidemia, unspecified: Secondary | ICD-10-CM | POA: Diagnosis not present

## 2021-10-28 DIAGNOSIS — I69392 Facial weakness following cerebral infarction: Secondary | ICD-10-CM | POA: Diagnosis not present

## 2021-10-28 DIAGNOSIS — K219 Gastro-esophageal reflux disease without esophagitis: Secondary | ICD-10-CM | POA: Diagnosis not present

## 2021-10-29 ENCOUNTER — Ambulatory Visit: Payer: Medicare Other | Admitting: Family Medicine

## 2021-10-29 ENCOUNTER — Telehealth: Payer: Self-pay

## 2021-10-29 NOTE — Telephone Encounter (Signed)
FYI  Please see below

## 2021-10-29 NOTE — Progress Notes (Deleted)
OFFICE VISIT  10/29/2021  CC: No chief complaint on file.   HPI:    Patient is a 88 y.o. male who presents for f/u HTN, chronic LE edema, recent hx of ischemic CVA, hx of IDA d/t chronic GI bleeding. A/P as of last visit: "1) HTN - good control overall, mild permissive HTN ok for him. Cont clonidine 0.1mg  bid, hctz 50 qd, valsartan 320 qd, calan SR 240mg  bid. Electrolytes today.   2) LE edema d/t chronic venous insuff: swelling is minimal and stable at this time. No changes necessary   3) Generalized weakness/debilitation/postural instability/unsteady gait: PT had a course of PT but felt it wasn't useful. Is able to ambulate successfully around the house.  Encouraged him to get out and take a 15 min walk daily.   4) Hx of IDA d/t chronic GI bleeding. No sign of overt bleeding, blood in stool. No melena. Plan has been: no endoscopy per pt choice, he is s/p iv and now PO iron long term, monitoring Hb/iron regularly-->Monitor cbc and iron panel today.   5.) chronic fatigue - patient mentioned he sleeps 3-5 hours a night.  Recommended melatonin for sleep."  INTERIM HX: *** Pt with recent lateral right posterior frontal lobe infarct on 61/60/7371 possibly embolic secondary to unclear source concerning for occult A. fib. He was discharged to SNF--[facility].  He completed aspirin plus Plavix x3 weeks and then continued on aspirin alone.  Discharged home on Diovan HCT for blood pressure control.  His verapamil was not continued at discharge.   Residual deficits at 09/26/2021 neurology follow-up -->dysarthria and left lower facial weakness, gait instability a bit worse than baseline. Has not started PT yet so this was recommended. Event monitor ha been sent to the wrong place upon discharge from hospital so it is recommended that he get this through cardiology.   Past Medical History:  Diagnosis Date   Ataxia 2014/15   brain MRI 02/2014 showed encephalomalacia parietal and occip lobes  c/w old infarcts, plus diffuse cerebral atrophy, o/w normal   Cataract    Chronic renal insufficiency, stage 2 (mild) 12/2016   GFR 60s-70s   CVA (cerebral vascular accident) ([place]) 07/2021   R MCA territory. No obstruction/stenosis on CTA head and neck.  No a-fib in hosp.  Echo with low-normal LV fxn.  Outpt rhythm monitoring planned.  ASA + plavix x 3wks, then ASA alone.   DDD (degenerative disc disease)    L-spine   Dermatitis    ?Contact derm? per [facility] D[facility] MD: Dr. Threasa Alpha (05/09/16): punch bx 06/18/16: chronic eczematous derm/contact derm, neg fungal stain.  Allergy (RAST) testing NEG.   GERD (gastroesophageal reflux disease)    Heme positive stool 02/2012   colonoscopy normal.  EGD showed small hiatal hernia with associated Cameron's erosions.  Hemoccults NEG x 3 09/2014. Hem + iron def 07/2019->conservative mgmt.   Hyperlipidemia    Hypertension    Insomnia    poor sleep hygiene.  Clonidine for his HTN has helped a little.   Iron deficiency anemia 02/2012; 07/2019   2013 Mild iron deficiency->small upper GI erosions, colonoscopy normal.  07/2019->Hb 7.5, heme+, GI eval->pt chose no endoscopy->IV iron and Hb monitoring, PO iron indefinitely: Hb/iron normal on recheck 10/2019 and 12/2019.   Macular degeneration, age related    exudative R eye; nonexudative L eye.  Vitreous degeneration bilat.   Obesity (BMI 30-39.9)    Osteoarthritis, multiple sites    Retinal hemorrhage 2016   w/ retinal detachment (  right eye) : anticoagulants stopped due to this   Venous insufficiency of both lower extremities     Past Surgical History:  Procedure Laterality Date   APPENDECTOMY  09/30/1933   cataract surg     Bilateral/ 08-13   COLONOSCOPY  05/12/2012   for iron def anemia--NORMAL RESULT   ESOPHAGOGASTRODUODENOSCOPY  06/17/2012   Small hiatal hernia with associated Cameron's erosions--likely source of his mild iron def anemia.   NASAL SINUS SURGERY     1998   TONSILLECTOMY  09/30/1933    TRANSTHORACIC ECHOCARDIOGRAM  08/11/2021   2022->(in setting of acute CVA) EF 50-55%, no thrombus, WMA c/w ischemic CM vs stress-induced CM.  Valves ok.    Outpatient Medications Prior to Visit  Medication Sig Dispense Refill   Aflibercept (EYLEA IO) Inject into the eye. One injection into right eye every month     aspirin EC 81 MG EC tablet Take 1 tablet (81 mg total) by mouth daily. Swallow whole. 30 tablet 11   atorvastatin (LIPITOR) 40 MG tablet Take 1 tablet (40 mg total) by mouth daily.     Cholecalciferol (VITAMIN D3) 2000 UNITS TABS Take 1 tablet by mouth daily.     cloNIDine (CATAPRES) 0.1 MG tablet TAKE 1 TABLET BY MOUTH TWICE A DAY 180 tablet 0   hydrochlorothiazide (HYDRODIURIL) 50 MG tablet Take 1 tablet (50 mg total) by mouth daily. 90 tablet 3   Multiple Vitamins-Minerals (PRESERVISION AREDS 2 PO) Take 1 tablet by mouth daily.     omeprazole (PRILOSEC) 40 MG capsule TAKE 1 CAPSULE BY MOUTH EVERY DAY (Patient taking differently: Take 40 mg by mouth daily.) 90 capsule 1   valsartan (DIOVAN) 320 MG tablet TAKE 1 TABLET BY MOUTH EVERY DAY (Patient taking differently: Take 320 mg by mouth daily.) 90 tablet 1   No facility-administered medications prior to visit.    No Known Allergies  ROS As per HPI  PE: Vitals with BMI 09/26/2021 08/16/2021 08/16/2021  Height 5\' 11"  - -  Weight 198 lbs - -  BMI 13.24 - -  Systolic 401 027 253  Diastolic 76 78 81  Pulse 76 95 70     Physical Exam  ***  LABS:  Last CBC Lab Results  Component Value Date   WBC 8.7 08/15/2021   HGB 13.1 08/15/2021   HCT 39.2 08/15/2021   MCV 97.5 08/15/2021   MCH 32.6 08/15/2021   RDW 14.2 08/15/2021   PLT 304 08/15/2021   Lab Results  Component Value Date   IRON 104 07/26/2021   TIBC 318 07/26/2021   FERRITIN 64 66/44/0347   Last metabolic panel Lab Results  Component Value Date   GLUCOSE 110 (H) 08/15/2021   NA 133 (L) 08/15/2021   K 3.9 08/15/2021   CL 102 08/15/2021   CO2  24 08/15/2021   BUN 20 08/15/2021   CREATININE 0.70 08/15/2021   GFRNONAA >60 08/15/2021   CALCIUM 8.2 (L) 08/15/2021   PROT 5.7 (L) 08/13/2021   ALBUMIN 2.8 (L) 08/13/2021   BILITOT 0.8 08/13/2021   ALKPHOS 66 08/13/2021   AST 25 08/13/2021   ALT 21 08/13/2021   ANIONGAP 7 08/15/2021   Last lipids Lab Results  Component Value Date   CHOL 243 (H) 08/11/2021   HDL 68 08/11/2021   LDLCALC 159 (H) 08/11/2021   LDLDIRECT 112.5 09/11/2011   TRIG 81 08/11/2021   CHOLHDL 3.6 08/11/2021   Last hemoglobin A1c Lab Results  Component Value Date   HGBA1C 5.8 (  H) 08/11/2021   Last thyroid functions Lab Results  Component Value Date   TSH 3.260 08/11/2021   IMPRESSION AND PLAN:  No problem-specific Assessment & Plan notes found for this encounter.  Cbc,cmet, flp  An After Visit Summary was printed and given to the patient.  FOLLOW UP: No follow-ups on file.  Signed:  Crissie Sickles, MD           10/29/2021

## 2021-10-29 NOTE — Telephone Encounter (Signed)
Noted. If patient unable to get out of the home then I can do a home visit this time.   If patient/son agreeable please set this up as a last appointment of the day sometime in the next couple of weeks.

## 2021-10-29 NOTE — Telephone Encounter (Signed)
pt son called at 10am to cancel appt for today at 1130. reason for cancellation - son was not able to bring him to appt today

## 2021-10-30 ENCOUNTER — Telehealth: Payer: Self-pay

## 2021-10-30 DIAGNOSIS — I129 Hypertensive chronic kidney disease with stage 1 through stage 4 chronic kidney disease, or unspecified chronic kidney disease: Secondary | ICD-10-CM | POA: Diagnosis not present

## 2021-10-30 DIAGNOSIS — I69393 Ataxia following cerebral infarction: Secondary | ICD-10-CM | POA: Diagnosis not present

## 2021-10-30 DIAGNOSIS — I69320 Aphasia following cerebral infarction: Secondary | ICD-10-CM | POA: Diagnosis not present

## 2021-10-30 DIAGNOSIS — I69392 Facial weakness following cerebral infarction: Secondary | ICD-10-CM | POA: Diagnosis not present

## 2021-10-30 DIAGNOSIS — R4781 Slurred speech: Secondary | ICD-10-CM | POA: Diagnosis not present

## 2021-10-30 DIAGNOSIS — I69398 Other sequelae of cerebral infarction: Secondary | ICD-10-CM | POA: Diagnosis not present

## 2021-10-30 NOTE — Telephone Encounter (Signed)
Home health orders received 10/30/21 for [facility] initiation orders: Yes.  Home health re-certification orders: No. Patient last seen by ordering physician for this condition: 07/26/21. Must be less than 90 days for re-certification and less than 30 days prior for initiation. Visit must have been for the condition the orders are being placed.  Patient meets criteria for Physician to sign orders: Yes.        Current med list has been attached: Yes        Orders placed on physicians desk for signature: 10/30/21 (date) If patient does not meet criteria for orders to be signed: pt was called to schedule appt. Appt is scheduled for 11/09/21.   Emilee Hero

## 2021-10-30 NOTE — Telephone Encounter (Signed)
-----   Message from Frann Rider, NP sent at 10/30/2021 10:17 AM EST ----- Please advise recent heart monitor did not show evidence of atrial fibrillation. Has appt with cardiology tomorrow for further discuss - would recommend further discussion of loop recorder placement at that visit.

## 2021-10-30 NOTE — Telephone Encounter (Signed)
Contacted pt, spoke to son jon per DRP, informed him recent heart monitor did not show evidence of atrial fibrillation. Has appt with cardiology tomorrow for further discuss - Janett Billow would recommend further discussion of loop recorder placement at that visit. Advised to call the office back with questions as he had none at this time and was appreciative.

## 2021-10-31 ENCOUNTER — Encounter (INDEPENDENT_AMBULATORY_CARE_PROVIDER_SITE_OTHER): Payer: Self-pay

## 2021-10-31 ENCOUNTER — Other Ambulatory Visit: Payer: Self-pay

## 2021-10-31 ENCOUNTER — Ambulatory Visit (INDEPENDENT_AMBULATORY_CARE_PROVIDER_SITE_OTHER): Payer: Medicare Other | Admitting: Cardiology

## 2021-10-31 ENCOUNTER — Encounter: Payer: Self-pay | Admitting: Cardiology

## 2021-10-31 VITALS — BP 150/84 | HR 71 | Ht 71.0 in | Wt 194.2 lb

## 2021-10-31 DIAGNOSIS — I639 Cerebral infarction, unspecified: Secondary | ICD-10-CM

## 2021-10-31 DIAGNOSIS — E782 Mixed hyperlipidemia: Secondary | ICD-10-CM | POA: Diagnosis not present

## 2021-10-31 DIAGNOSIS — I10 Essential (primary) hypertension: Secondary | ICD-10-CM

## 2021-10-31 NOTE — Progress Notes (Signed)
Cardiology Office Note:    Date:  10/31/2021   ID:  Glen Barnett, DOB July 01, 1927, MRN [id number]  PCP:  Tammi Sou, MD   [facility] Providers Cardiologist:  None {  Referring MD: Tammi Sou, MD    History of Present Illness:    Glen Barnett is a 88 y.o. male with a hx of recent right MCA stroke, HTN, and GERD who was referred to clinic by Dr. Anitra Lauth for further management of stroke.  Patient was admitted from 08/10/21-08/16/21 where he presented after a fall where he was found to have slurred speech, aphasia, left sided facial droop. MRI showed right MCA territory infarcts. TTE 11/12 with EF 50-55%, moderate LVH, normal RV, mild AR, mild AS. EEG without seizures.   Today, the patient is accompanied by a family member. Overall he is feeling good. He believes he has recovered well from his recent stroke. However, he continues to have some difficulty with walking but is able to ambulate with a cane.  Since his stroke, he sometimes feels "just a little dizzy," but this has not caused a fall. Lately he had one mechanical fall while bending over to clean up a spill, but this is rare. He has chronic SOB that has been ongoing for years and is unchanged. Blood pressure mainly 140-150s at home but has orthostatic symptoms with standing and therefore he has been maintained on his current regimen.  We discussed his monitor results at length which shows no Afib.He would like to be as minimally invasive as possible and to keep things the same unless he feels poorly.  He denies any palpitations, or chest pain. No syncope, orthopnea, PND, or lower extremity edema.   Past Medical History:  Diagnosis Date   Ataxia 2014/15   brain MRI 02/2014 showed encephalomalacia parietal and occip lobes c/w old infarcts, plus diffuse cerebral atrophy, o/w normal   Cataract    Chronic renal insufficiency, stage 2 (mild) 12/2016   GFR 60s-70s   CVA (cerebral vascular accident)  ([place]) 07/2021   R MCA territory. No obstruction/stenosis on CTA head and neck.  No a-fib in hosp.  Echo with low-normal LV fxn.  Outpt rhythm monitoring planned.  ASA + plavix x 3wks, then ASA alone.   DDD (degenerative disc disease)    L-spine   Dermatitis    ?Contact derm? per [facility] MD: Dr. Threasa Alpha (05/09/16): punch bx 06/18/16: chronic eczematous derm/contact derm, neg fungal stain.  Allergy (RAST) testing NEG.   GERD (gastroesophageal reflux disease)    Heme positive stool 02/2012   colonoscopy normal.  EGD showed small hiatal hernia with associated Cameron's erosions.  Hemoccults NEG x 3 09/2014. Hem + iron def 07/2019->conservative mgmt.   Hyperlipidemia    Hypertension    Insomnia    poor sleep hygiene.  Clonidine for his HTN has helped a little.   Iron deficiency anemia 02/2012; 07/2019   2013 Mild iron deficiency->small upper GI erosions, colonoscopy normal.  07/2019->Hb 7.5, heme+, GI eval->pt chose no endoscopy->IV iron and Hb monitoring, PO iron indefinitely: Hb/iron normal on recheck 10/2019 and 12/2019.   Macular degeneration, age related    exudative R eye; nonexudative L eye.  Vitreous degeneration bilat.   Obesity (BMI 30-39.9)    Osteoarthritis, multiple sites    Retinal hemorrhage 2016   w/ retinal detachment (right eye) : anticoagulants stopped due to this   Venous insufficiency of both lower extremities     Past Surgical  History:  Procedure Laterality Date   APPENDECTOMY  09/30/1933   cataract surg     Bilateral/ 08-13   COLONOSCOPY  05/12/2012   for iron def anemia--NORMAL RESULT   ESOPHAGOGASTRODUODENOSCOPY  06/17/2012   Small hiatal hernia with associated Cameron's erosions--likely source of his mild iron def anemia.   NASAL SINUS SURGERY     1998   TONSILLECTOMY  09/30/1933   TRANSTHORACIC ECHOCARDIOGRAM  08/11/2021   2022->(in setting of acute CVA) EF 50-55%, no thrombus, WMA c/w ischemic CM vs stress-induced CM.  Valves ok.    Current  Medications: Current Meds  Medication Sig   Aflibercept (EYLEA IO) Inject into the eye. One injection into right eye every month   aspirin EC 81 MG EC tablet Take 1 tablet (81 mg total) by mouth daily. Swallow whole.   atorvastatin (LIPITOR) 40 MG tablet Take 1 tablet (40 mg total) by mouth daily.   Cholecalciferol (VITAMIN D3) 2000 UNITS TABS Take 1 tablet by mouth daily.   cloNIDine (CATAPRES) 0.1 MG tablet TAKE 1 TABLET BY MOUTH TWICE A DAY   hydrochlorothiazide (HYDRODIURIL) 50 MG tablet Take 1 tablet (50 mg total) by mouth daily.   Multiple Vitamins-Minerals (PRESERVISION AREDS 2 PO) Take 1 tablet by mouth daily.   omeprazole (PRILOSEC) 40 MG capsule TAKE 1 CAPSULE BY MOUTH EVERY DAY   valsartan (DIOVAN) 320 MG tablet TAKE 1 TABLET BY MOUTH EVERY DAY     Allergies:   Patient has no known allergies.   Social History   Socioeconomic History   Marital status: Widowed    Spouse name: Not on file   Number of children: Not on file   Years of education: Not on file   Highest education level: Not on file  Occupational History   Not on file  Tobacco Use   Smoking status: Never   Smokeless tobacco: Never  Vaping Use   Vaping Use: Never used  Substance and Sexual Activity   Alcohol use: No   Drug use: No   Sexual activity: Not Currently  Other Topics Concern   Not on file  Social History Narrative   Widower.  Wife died 08-29-11 of brain cancer.   He relocated from [place] to live near son.   NO exercise.  No T/A/Ds.   Retired Chief Financial Officer.   Social Determinants of Health   Financial Resource Strain: Low Risk    Difficulty of Paying Living Expenses: Not hard at all  Food Insecurity: No Food Insecurity   Worried About Charity fundraiser in the Last Year: Never true   [place] in the Last Year: Never true  Transportation Needs: No Transportation Needs   Lack of Transportation (Medical): No   Lack of Transportation (Non-Medical): No  Physical Activity: Inactive    Days of Exercise per Week: 0 days   Minutes of Exercise per Session: 0 min  Stress: No Stress Concern Present   Feeling of Stress : Not at all  Social Connections: Socially Isolated   Frequency of Communication with Friends and Family: More than three times a week   Frequency of Social Gatherings with Friends and Family: More than three times a week   Attends Religious Services: Never   Marine scientist or Organizations: No   Attends Archivist Meetings: Never   Marital Status: Widowed     Family History: The patient's family history includes Liver cancer in his mother.  ROS:   Review of Systems  Constitutional:  Negative for chills and fever.  HENT:  Negative for ear pain and sinus pain.   Eyes:  Negative for blurred vision and double vision.  Respiratory:  Positive for shortness of breath. Negative for cough and wheezing.   Cardiovascular:  Negative for chest pain, palpitations, orthopnea, claudication, leg swelling and PND.  Gastrointestinal:  Negative for nausea and vomiting.  Genitourinary:  Negative for dysuria.  Musculoskeletal:  Positive for falls. Negative for neck pain.  Neurological:  Positive for dizziness. Negative for loss of consciousness.  Endo/Heme/Allergies:  Negative for environmental allergies.  Psychiatric/Behavioral:  Negative for substance abuse. The patient does not have insomnia.     EKGs/Labs/Other Studies Reviewed:    The following studies were reviewed today:  Monitor 09/2021: Patch wear time was 14 days Predominant rhythm was NSR with average HR 71 (45-171) Brief 4 beat episode of nonsustained VT at rate 171; 9 runs of nonsustained SVT with longest lasting 10.9s at rate 141 Occasional PVCs (2.7%) No Afib, sustained arrhythmias or significant pauses    Patch Wear Time:  14 days and 0 hours (2023-01-04T10:16:49-0500 to 2023-01-18T10:16:49-0500)   Patient had a min HR of 45 bpm, max HR of 171 bpm, and avg HR of 71 bpm. Predominant  underlying rhythm was Sinus Rhythm. First Degree AV Block was present. Bundle Branch Block/IVCD was present. 1 run of Ventricular Tachycardia occurred lasting 4 beats  with a max rate of 171 bpm (avg 142 bpm). 9 Supraventricular Tachycardia runs occurred, the run with the fastest interval lasting 10.9 secs with a max rate of 141 bpm (avg 139 bpm); the run with the fastest interval was also the longest. Isolated SVEs  were rare (<1.0%), SVE Couplets were rare (<1.0%), and SVE Triplets were rare (<1.0%). Isolated VEs were occasional (2.7%, 37209), VE Couplets were rare (<1.0%, 849), and VE Triplets were rare (<1.0%, 40). Ventricular Bigeminy and Trigeminy were present.  Bilateral LE Venous DVT 08/13/2021: Summary:  BILATERAL:  - No evidence of deep vein thrombosis seen in the lower extremities,  bilaterally.  -No evidence of popliteal cyst, bilaterally.   TTE 08/11/21: IMPRESSIONS    1. No LV mural thrombus observed with Definity contrast. Wall motion  abnormalities are suggestive of ischemic cardiomyopathy although stress  induced cardiomyopathy is not excluded.   2. Left ventricular ejection fraction, by estimation, is 50 to 55%. The  left ventricle has low normal function. The left ventricle demonstrates  regional wall motion abnormalities (see scoring diagram/findings for  description). There is moderate  concentric left ventricular hypertrophy. Left ventricular diastolic  parameters are indeterminate.   3. Right ventricular systolic function is normal. The right ventricular  size is normal. Tricuspid regurgitation signal is inadequate for assessing  PA pressure.   4. The mitral valve is grossly normal. Trivial mitral valve  regurgitation.   5. The aortic valve is tricuspid with significantly restricted motion of  the noncoronary cusp. There is moderate calcification of the aortic valve.  Aortic valve regurgitation is mild. Mild aortic valve stenosis. Aortic  valve mean gradient  measures 8.0  mmHg. Dimentionless index 0.42.   6. The inferior vena cava is normal in size with greater than 50%  respiratory variability, suggesting right atrial pressure of 3 mmHg.  EKG:  EKG is personally reviewed.  10/31/2021: EKG was not ordered.  Recent Labs: 08/11/2021: TSH 3.260 08/13/2021: ALT 21 08/15/2021: BUN 20; Creatinine, Ser 0.70; Hemoglobin 13.1; Magnesium 2.0; Platelets 304; Potassium 3.9; Sodium 133   Recent Lipid Panel  Component Value Date/Time   CHOL 243 (H) 08/11/2021 0339   TRIG 81 08/11/2021 0339   HDL 68 08/11/2021 0339   CHOLHDL 3.6 08/11/2021 0339   VLDL 16 08/11/2021 0339   LDLCALC 159 (H) 08/11/2021 0339   LDLDIRECT 112.5 09/11/2011 1218     Risk Assessment/Calculations:           Physical Exam:    VS:  BP (!) 150/84    Pulse 71    Ht 5\' 11"  (1.803 m)    Wt 194 lb 3.2 oz (88.1 kg)    SpO2 95%    BMI 27.09 kg/m     Wt Readings from Last 3 Encounters:  10/31/21 194 lb 3.2 oz (88.1 kg)  09/26/21 198 lb (89.8 kg)  07/26/21 195 lb 3.2 oz (88.5 kg)     GEN:  Well nourished, well developed in no acute distress HEENT: Normal NECK: No JVD; No carotid bruits CARDIAC: RRR, 2/6 systolic murmur. No rubs, gallops RESPIRATORY:  Clear to auscultation without rales, wheezing or rhonchi  ABDOMEN: Soft, non-tender, non-distended MUSCULOSKELETAL:  Trace LE edema SKIN: Warm and dry NEUROLOGIC:  Alert and oriented x 3 PSYCHIATRIC:  Normal affect   ASSESSMENT:    1. Acute CVA (cerebrovascular accident) ([place])   2. Primary hypertension   3. Mixed hyperlipidemia    PLAN:    In order of problems listed above:  #Right MCA Stroke: Admitted to [facility] hospital 07/2021 with fall and slurred speech, aphasia and left sided weakness found to have right MCA stroke. TTE with EF 50-55%, mild AS, mild AR, normal RV. Cardiac monitor with NSR, no Afib, brief NSTV, brief SVT, occasional PVCs (2.7%), No Afib. We discussed possible loop but patient would like to be as  minimally invasive as possible. He would like to continue current regimen/course unless he feels unwell. Given his age, this is very reasonable. Discussed that he could consider a Kardia device if symptoms were to arise.  -Continue ASA 81mg  daily -Continue lipitor 40mg  daily -Declined loop  #HTN: Elevated at 150s but with some orthostatic symptoms. Again, patient would not like to change medications at this time as these have been working for him for years. Ideally, would like to stop clonidine and HCTZ but will honor the patient's wishes to keep things the same at this time and only make changes if he does not feel well. -Continue valsartan 320mg  daily -Continue HCTZ 50mg  daily -Clonidine 0.1mg  BID -Patient does not want to change his medications at this time; very low threshold to transition off HCTZ and clonidine if orthostatic symptoms worsen  #HLD: -Continue lipitor 40mg  daily     Follow-up:   6 months  Medication Adjustments/Labs and Tests Ordered: Current medicines are reviewed at length with the patient today.  Concerns regarding medicines are outlined above.   No orders of the defined types were placed in this encounter.  No orders of the defined types were placed in this encounter.  Patient Instructions  Medication Instructions:   Your physician recommends that you continue on your current medications as directed. Please refer to the Current Medication list given to you today.  *If you need a refill on your cardiac medications before your next appointment, please call your pharmacy*   Follow-Up: At [facility], you and your health needs are our priority.  As part of our continuing mission to provide you with exceptional heart care, we have created designated Provider Care Teams.  These Care Teams include your primary Cardiologist (physician)  and Advanced Practice Providers (APPs -  Physician Assistants and Nurse Practitioners) who all work together to provide you with  the care you need, when you need it.  We recommend signing up for the patient portal called "MyChart".  Sign up information is provided on this After Visit Summary.  MyChart is used to connect with patients for Virtual Visits (Telemedicine).  Patients are able to view lab/test results, encounter notes, upcoming appointments, etc.  Non-urgent messages can be sent to your provider as well.   To learn more about what you can do with MyChart, go to [email or web address].    Your next appointment:   6 month(s)  The format for your next appointment:   In Person  Provider:   DR. Delmar Landau Stumpf,acting as a scribe for Freada Bergeron, MD.,have documented all relevant documentation on the behalf of Freada Bergeron, MD,as directed by  Freada Bergeron, MD while in the presence of Freada Bergeron, MD.  I, Freada Bergeron, MD, have reviewed all documentation for this visit. The documentation on 10/31/21 for the exam, diagnosis, procedures, and orders are all accurate and complete.   Signed, Freada Bergeron, MD  10/31/2021 10:51 AM    Jessie

## 2021-10-31 NOTE — Patient Instructions (Signed)
Medication Instructions:   Your physician recommends that you continue on your current medications as directed. Please refer to the Current Medication list given to you today.  *If you need a refill on your cardiac medications before your next appointment, please call your pharmacy*   Follow-Up: At [facility], you and your health needs are our priority.  As part of our continuing mission to provide you with exceptional heart care, we have created designated Provider Care Teams.  These Care Teams include your primary Cardiologist (physician) and Advanced Practice Providers (APPs -  Physician Assistants and Nurse Practitioners) who all work together to provide you with the care you need, when you need it.  We recommend signing up for the patient portal called "MyChart".  Sign up information is provided on this After Visit Summary.  MyChart is used to connect with patients for Virtual Visits (Telemedicine).  Patients are able to view lab/test results, encounter notes, upcoming appointments, etc.  Non-urgent messages can be sent to your provider as well.   To learn more about what you can do with MyChart, go to [email or web address].    Your next appointment:   6 month(s)  The format for your next appointment:   In Person  Provider:    DR. PEMBERTON   

## 2021-11-05 DIAGNOSIS — E782 Mixed hyperlipidemia: Secondary | ICD-10-CM | POA: Diagnosis not present

## 2021-11-05 DIAGNOSIS — K219 Gastro-esophageal reflux disease without esophagitis: Secondary | ICD-10-CM | POA: Diagnosis not present

## 2021-11-05 DIAGNOSIS — I10 Essential (primary) hypertension: Secondary | ICD-10-CM | POA: Diagnosis not present

## 2021-11-05 DIAGNOSIS — I639 Cerebral infarction, unspecified: Secondary | ICD-10-CM | POA: Diagnosis not present

## 2021-11-05 DIAGNOSIS — M6281 Muscle weakness (generalized): Secondary | ICD-10-CM | POA: Diagnosis not present

## 2021-11-05 DIAGNOSIS — M1991 Primary osteoarthritis, unspecified site: Secondary | ICD-10-CM | POA: Diagnosis not present

## 2021-11-05 DIAGNOSIS — E559 Vitamin D deficiency, unspecified: Secondary | ICD-10-CM | POA: Diagnosis not present

## 2021-11-05 DIAGNOSIS — D649 Anemia, unspecified: Secondary | ICD-10-CM | POA: Diagnosis not present

## 2021-11-06 DIAGNOSIS — I129 Hypertensive chronic kidney disease with stage 1 through stage 4 chronic kidney disease, or unspecified chronic kidney disease: Secondary | ICD-10-CM | POA: Diagnosis not present

## 2021-11-06 DIAGNOSIS — I69393 Ataxia following cerebral infarction: Secondary | ICD-10-CM | POA: Diagnosis not present

## 2021-11-06 DIAGNOSIS — I69320 Aphasia following cerebral infarction: Secondary | ICD-10-CM | POA: Diagnosis not present

## 2021-11-06 DIAGNOSIS — I69398 Other sequelae of cerebral infarction: Secondary | ICD-10-CM | POA: Diagnosis not present

## 2021-11-06 DIAGNOSIS — R4781 Slurred speech: Secondary | ICD-10-CM | POA: Diagnosis not present

## 2021-11-06 DIAGNOSIS — I69392 Facial weakness following cerebral infarction: Secondary | ICD-10-CM | POA: Diagnosis not present

## 2021-11-07 DIAGNOSIS — R4781 Slurred speech: Secondary | ICD-10-CM | POA: Diagnosis not present

## 2021-11-07 DIAGNOSIS — I129 Hypertensive chronic kidney disease with stage 1 through stage 4 chronic kidney disease, or unspecified chronic kidney disease: Secondary | ICD-10-CM | POA: Diagnosis not present

## 2021-11-07 DIAGNOSIS — I69320 Aphasia following cerebral infarction: Secondary | ICD-10-CM | POA: Diagnosis not present

## 2021-11-07 DIAGNOSIS — I69398 Other sequelae of cerebral infarction: Secondary | ICD-10-CM | POA: Diagnosis not present

## 2021-11-07 DIAGNOSIS — I69393 Ataxia following cerebral infarction: Secondary | ICD-10-CM | POA: Diagnosis not present

## 2021-11-07 DIAGNOSIS — I69392 Facial weakness following cerebral infarction: Secondary | ICD-10-CM | POA: Diagnosis not present

## 2021-11-07 NOTE — Telephone Encounter (Signed)
Signed and put in box to go up front. Signed:  Crissie Sickles, MD           11/07/2021

## 2021-11-08 DIAGNOSIS — E119 Type 2 diabetes mellitus without complications: Secondary | ICD-10-CM | POA: Diagnosis not present

## 2021-11-08 DIAGNOSIS — E039 Hypothyroidism, unspecified: Secondary | ICD-10-CM | POA: Diagnosis not present

## 2021-11-08 DIAGNOSIS — I10 Essential (primary) hypertension: Secondary | ICD-10-CM | POA: Diagnosis not present

## 2021-11-08 DIAGNOSIS — E782 Mixed hyperlipidemia: Secondary | ICD-10-CM | POA: Diagnosis not present

## 2021-11-08 DIAGNOSIS — E559 Vitamin D deficiency, unspecified: Secondary | ICD-10-CM | POA: Diagnosis not present

## 2021-11-08 DIAGNOSIS — D519 Vitamin B12 deficiency anemia, unspecified: Secondary | ICD-10-CM | POA: Diagnosis not present

## 2021-11-09 ENCOUNTER — Encounter: Payer: Self-pay | Admitting: Family Medicine

## 2021-11-09 ENCOUNTER — Ambulatory Visit (INDEPENDENT_AMBULATORY_CARE_PROVIDER_SITE_OTHER): Payer: Medicare Other | Admitting: Family Medicine

## 2021-11-09 ENCOUNTER — Other Ambulatory Visit: Payer: Self-pay

## 2021-11-09 VITALS — BP 124/69 | HR 60 | Temp 97.6°F | Ht 71.0 in | Wt 195.6 lb

## 2021-11-09 DIAGNOSIS — I10 Essential (primary) hypertension: Secondary | ICD-10-CM

## 2021-11-09 DIAGNOSIS — R600 Localized edema: Secondary | ICD-10-CM

## 2021-11-09 DIAGNOSIS — E7800 Pure hypercholesterolemia, unspecified: Secondary | ICD-10-CM | POA: Diagnosis not present

## 2021-11-09 DIAGNOSIS — I639 Cerebral infarction, unspecified: Secondary | ICD-10-CM | POA: Diagnosis not present

## 2021-11-09 DIAGNOSIS — Z862 Personal history of diseases of the blood and blood-forming organs and certain disorders involving the immune mechanism: Secondary | ICD-10-CM

## 2021-11-09 DIAGNOSIS — Z8719 Personal history of other diseases of the digestive system: Secondary | ICD-10-CM | POA: Diagnosis not present

## 2021-11-09 DIAGNOSIS — I6930 Unspecified sequelae of cerebral infarction: Secondary | ICD-10-CM | POA: Diagnosis not present

## 2021-11-09 LAB — COMPREHENSIVE METABOLIC PANEL
ALT: 11 U/L (ref 0–53)
AST: 14 U/L (ref 0–37)
Albumin: 3.7 g/dL (ref 3.5–5.2)
Alkaline Phosphatase: 86 U/L (ref 39–117)
BUN: 21 mg/dL (ref 6–23)
CO2: 35 mEq/L — ABNORMAL HIGH (ref 19–32)
Calcium: 9.1 mg/dL (ref 8.4–10.5)
Chloride: 100 mEq/L (ref 96–112)
Creatinine, Ser: 0.83 mg/dL (ref 0.40–1.50)
GFR: 76.1 mL/min (ref >60.00)
Glucose, Bld: 105 mg/dL — ABNORMAL HIGH (ref 70–99)
Potassium: 3.8 mEq/L (ref 3.5–5.1)
Sodium: 140 mEq/L (ref 135–145)
Total Bilirubin: 0.5 mg/dL (ref 0.2–1.2)
Total Protein: 5.7 g/dL — ABNORMAL LOW (ref 6.0–8.3)

## 2021-11-09 LAB — CBC
HCT: 36.4 % — ABNORMAL LOW (ref 39.0–52.0)
Hemoglobin: 11.9 g/dL — ABNORMAL LOW (ref 13.0–17.0)
MCHC: 32.8 g/dL (ref 30.0–36.0)
MCV: 98.2 fl (ref 78.0–100.0)
Platelets: 343 10*3/uL (ref 150.0–400.0)
RBC: 3.71 Mil/uL — ABNORMAL LOW (ref 4.22–5.81)
RDW: 15 % (ref 11.5–15.5)
WBC: 6.4 10*3/uL (ref 4.0–10.5)

## 2021-11-09 MED ORDER — VALSARTAN 320 MG PO TABS: 320.0000 mg | ORAL_TABLET | Freq: Every day | ORAL | 3 refills | Status: DC

## 2021-11-09 MED ORDER — OMEPRAZOLE 40 MG PO CPDR: 40.0000 mg | DELAYED_RELEASE_CAPSULE | Freq: Every day | ORAL | 3 refills | Status: DC

## 2021-11-09 MED ORDER — CLONIDINE HCL 0.1 MG PO TABS: 0.1000 mg | ORAL_TABLET | Freq: Two times a day (BID) | ORAL | 3 refills | Status: DC

## 2021-11-09 MED ORDER — HYDROCHLOROTHIAZIDE 50 MG PO TABS: 50.0000 mg | ORAL_TABLET | Freq: Every day | ORAL | 3 refills | Status: DC

## 2021-11-09 NOTE — Progress Notes (Signed)
OFFICE VISIT  11/09/2021  CC:  Chief Complaint  Patient presents with   Follow-up    RCI; pt is not fasting   HPI:    Patient is a 88 y.o. male who presents accompanied by his youngest son for f/u HTN, chronic LE edema, hx of IDA, and recent CVA 07/2021.  INTERIM HX: Glen Barnett had a stroke August 10, 2021.  Right MCA territory. Echo showed EF 50 to 55%, moderate LVH, normal RV, mild AR, mild AS.  EEG without seizures.  Lower extremity venous Dopplers 08/13/2021 negative. 14-day rhythm monitoring showed no A-fib. At recent cardiology follow-up to 10/31/2021 there was discussion of possibly doing a loop but patient wanted to be as minimally invasive as possible. He was continued on aspirin 81 mg a day and Lipitor 40 mg a day. He was continued on valsartan 320 mg a day, HCTZ 50 mg a day and clonidine 0.1 mg twice a day.  Currently Glen Barnett says he is doing very well. He has been living at [facility] on [address] in [place]. He likes it there pretty well, just did not like being "cooped up ". He has a little bit of residual slurred speech but says this is gradually getting better. Denies focal weakness or any paresthesias. He walks with a walker, feels unsteady at times, feels occasionally like his left knee will buckle.  He denies any pain. Appetite is good and he is eating well.  ROS as above, plus--> no fevers, no CP, no SOB, no wheezing, no cough, no dizziness, no HAs, no rashes, no melena/hematochezia.  No polyuria or polydipsia.  No myalgias or arthralgias.  No focal weakness, paresthesias, or tremors.  No acute vision or hearing abnormalities.  No dysuria or unusual/new urinary urgency or frequency.  No recent changes in lower legs. No n/v/d or abd pain.  No palpitations.    Past Medical History:  Diagnosis Date   Ataxia 2014/15   brain MRI 02/2014 showed encephalomalacia parietal and occip lobes c/w old infarcts, plus diffuse cerebral atrophy, o/w normal    Cataract    Chronic renal insufficiency, stage 2 (mild) 12/2016   GFR 60s-70s   CVA (cerebral vascular accident) ([facility]) 07/2021   R MCA territory. No obstruction/stenosis on CTA head and neck.  No a-fib in hosp.  Echo with low-normal LV fxn.  Outpt rhythm monitoring planned.  ASA + plavix x 3wks, then ASA alone.   DDD (degenerative disc disease)    L-spine   Dermatitis    ?Contact derm? per [facility] MD: Dr. Threasa Alpha (05/09/16): punch bx 06/18/16: chronic eczematous derm/contact derm, neg fungal stain.  Allergy (RAST) testing NEG.   GERD (gastroesophageal reflux disease)    Heme positive stool 02/2012   colonoscopy normal.  EGD showed small hiatal hernia with associated Cameron's erosions.  Hemoccults NEG x 3 09/2014. Hem + iron def 07/2019->conservative mgmt.   Hyperlipidemia    Hypertension    Insomnia    poor sleep hygiene.  Clonidine for his HTN has helped a little.   Iron deficiency anemia 02/2012; 07/2019   2013 Mild iron deficiency->small upper GI erosions, colonoscopy normal.  07/2019->Hb 7.5, heme+, GI eval->pt chose no endoscopy->IV iron and Hb monitoring, PO iron indefinitely: Hb/iron normal on recheck 10/2019 and 12/2019.   Macular degeneration, age related    exudative R eye; nonexudative L eye.  Vitreous degeneration bilat.   Obesity (BMI 30-39.9)    Osteoarthritis, multiple sites    Retinal hemorrhage 2016  w/ retinal detachment (right eye) : anticoagulants stopped due to this   Venous insufficiency of both lower extremities     Past Surgical History:  Procedure Laterality Date   APPENDECTOMY  09/30/1933   cataract surg     Bilateral/ 08-13   COLONOSCOPY  05/12/2012   for iron def anemia--NORMAL RESULT   ESOPHAGOGASTRODUODENOSCOPY  06/17/2012   Small hiatal hernia with associated Cameron's erosions--likely source of his mild iron def anemia.   NASAL SINUS SURGERY     1998   TONSILLECTOMY  09/30/1933   TRANSTHORACIC ECHOCARDIOGRAM  08/11/2021   2022->(in setting  of acute CVA) EF 50-55%, no thrombus, WMA c/w ischemic CM vs stress-induced CM.  Valves ok.    Outpatient Medications Prior to Visit  Medication Sig Dispense Refill   Aflibercept (EYLEA IO) Inject into the eye. One injection into right eye every month     aspirin EC 81 MG EC tablet Take 1 tablet (81 mg total) by mouth daily. Swallow whole. 30 tablet 11   atorvastatin (LIPITOR) 40 MG tablet Take 1 tablet (40 mg total) by mouth daily.     Cholecalciferol (VITAMIN D3) 2000 UNITS TABS Take 1 tablet by mouth daily.     Multiple Vitamins-Minerals (PRESERVISION AREDS 2 PO) Take 1 tablet by mouth daily.     cloNIDine (CATAPRES) 0.1 MG tablet TAKE 1 TABLET BY MOUTH TWICE A DAY 180 tablet 0   hydrochlorothiazide (HYDRODIURIL) 50 MG tablet Take 1 tablet (50 mg total) by mouth daily. 90 tablet 3   omeprazole (PRILOSEC) 40 MG capsule TAKE 1 CAPSULE BY MOUTH EVERY DAY 90 capsule 1   valsartan (DIOVAN) 320 MG tablet TAKE 1 TABLET BY MOUTH EVERY DAY 90 tablet 1   No facility-administered medications prior to visit.    No Known Allergies  ROS As per HPI  PE: Vitals with BMI 11/09/2021 10/31/2021 09/26/2021  Height 5\' 11"  5\' 11"  5\' 11"   Weight 195 lbs 10 oz 194 lbs 3 oz 198 lbs  BMI 27.29 20.9 47.09  Systolic 628 366 294  Diastolic 69 84 76  Pulse 60 71 76     Physical Exam  Gen: Alert, well appearing.  Patient is oriented to person, place, time, and situation. AFFECT: pleasant, lucid thought and speech. CV: RRR, soft systolic murmur, no diastolic murmur, no r/g.   LUNGS: CTA bilat, nonlabored resps, good aeration in all lung fields. Extremities: He has 1-2+ bilateral lower extremity pitting edema. Calf circumference symmetric--> 41 cm measured at 15 cm below the inferior border of patella He has some hyperkeratotic flaking in pretibial regions bilaterally.  No erythema or tenderness. Neuro: CN 2-12 intact bilaterally except mild lower facial asymmetry with weakness of smile on left side.    strength 5/5 in proximal and distal upper extremities and lower extremities bilaterally.  No sensory deficits.  No tremor. FNF normal bilat.   Upper extremity and lower extremity DTRs symmetric, essentially absent bilat. No pronator drift.  LABS:  Last CBC Lab Results  Component Value Date   WBC 8.7 08/15/2021   HGB 13.1 08/15/2021   HCT 39.2 08/15/2021   MCV 97.5 08/15/2021   MCH 32.6 08/15/2021   RDW 14.2 08/15/2021   PLT 304 08/15/2021   Lab Results  Component Value Date   IRON 104 07/26/2021   TIBC 318 07/26/2021   FERRITIN 64 76/54/6503   Last metabolic panel Lab Results  Component Value Date   GLUCOSE 110 (H) 08/15/2021   NA 133 (L) 08/15/2021  K 3.9 08/15/2021   CL 102 08/15/2021   CO2 24 08/15/2021   BUN 20 08/15/2021   CREATININE 0.70 08/15/2021   GFRNONAA >60 08/15/2021   CALCIUM 8.2 (L) 08/15/2021   PROT 5.7 (L) 08/13/2021   ALBUMIN 2.8 (L) 08/13/2021   BILITOT 0.8 08/13/2021   ALKPHOS 66 08/13/2021   AST 25 08/13/2021   ALT 21 08/13/2021   ANIONGAP 7 08/15/2021   Last lipids Lab Results  Component Value Date   CHOL 243 (H) 08/11/2021   HDL 68 08/11/2021   LDLCALC 159 (H) 08/11/2021   LDLDIRECT 112.5 09/11/2011   TRIG 81 08/11/2021   CHOLHDL 3.6 08/11/2021   Last hemoglobin A1c Lab Results  Component Value Date   HGBA1C 5.8 (H) 08/11/2021   Last thyroid functions Lab Results  Component Value Date   TSH 3.260 08/11/2021   IMPRESSION AND PLAN:  #1 History of CVA, with slight dysarthria and left lower facial weakness as residual. He is finished with PT/OT he gets ongoing PT/OT. As per HPI above, plan is to continue with risk factor management--> good BP control, statin, aspirin.   He has his usual amount of instability with walking.  Stressed the importance of him using his walker everywhere he goes.  #2 hypertension, well controlled.  Continue valsartan 320 mg a day, HCTZ 50 mg a day, and clonidine 0.1 mg twice a day. Electrolytes and  creatinine today.  3.  Chronic lower extremity edema, venous insufficiency. Stable.  He knows to try to restrict sodium, elevate as needed.  #4 history of iron deficiency anemia, chronic GI bleed. As per past medical history section: Monitor CBC, no invasive evaluation.  5. Spent 42 min with pt today reviewing HPI, reviewing relevant past history, doing exam, reviewing and discussing lab and imaging data, and formulating plans.  An After Visit Summary was printed and given to the patient.  FOLLOW UP: Return in about 3 months (around 02/06/2022) for routine chronic illness f/u.  Signed:  Crissie Sickles, MD           11/09/2021

## 2021-11-13 DIAGNOSIS — I129 Hypertensive chronic kidney disease with stage 1 through stage 4 chronic kidney disease, or unspecified chronic kidney disease: Secondary | ICD-10-CM | POA: Diagnosis not present

## 2021-11-13 DIAGNOSIS — I69392 Facial weakness following cerebral infarction: Secondary | ICD-10-CM | POA: Diagnosis not present

## 2021-11-13 DIAGNOSIS — I69398 Other sequelae of cerebral infarction: Secondary | ICD-10-CM | POA: Diagnosis not present

## 2021-11-13 DIAGNOSIS — I69320 Aphasia following cerebral infarction: Secondary | ICD-10-CM | POA: Diagnosis not present

## 2021-11-13 DIAGNOSIS — R4781 Slurred speech: Secondary | ICD-10-CM | POA: Diagnosis not present

## 2021-11-13 DIAGNOSIS — I69393 Ataxia following cerebral infarction: Secondary | ICD-10-CM | POA: Diagnosis not present

## 2021-11-14 DIAGNOSIS — E782 Mixed hyperlipidemia: Secondary | ICD-10-CM | POA: Diagnosis not present

## 2021-11-14 DIAGNOSIS — I69393 Ataxia following cerebral infarction: Secondary | ICD-10-CM | POA: Diagnosis not present

## 2021-11-14 DIAGNOSIS — I129 Hypertensive chronic kidney disease with stage 1 through stage 4 chronic kidney disease, or unspecified chronic kidney disease: Secondary | ICD-10-CM | POA: Diagnosis not present

## 2021-11-14 DIAGNOSIS — I69398 Other sequelae of cerebral infarction: Secondary | ICD-10-CM | POA: Diagnosis not present

## 2021-11-14 DIAGNOSIS — I69392 Facial weakness following cerebral infarction: Secondary | ICD-10-CM | POA: Diagnosis not present

## 2021-11-14 DIAGNOSIS — I10 Essential (primary) hypertension: Secondary | ICD-10-CM | POA: Diagnosis not present

## 2021-11-14 DIAGNOSIS — I69320 Aphasia following cerebral infarction: Secondary | ICD-10-CM | POA: Diagnosis not present

## 2021-11-14 DIAGNOSIS — I639 Cerebral infarction, unspecified: Secondary | ICD-10-CM | POA: Diagnosis not present

## 2021-11-14 DIAGNOSIS — R4781 Slurred speech: Secondary | ICD-10-CM | POA: Diagnosis not present

## 2021-11-16 DIAGNOSIS — F331 Major depressive disorder, recurrent, moderate: Secondary | ICD-10-CM | POA: Diagnosis not present

## 2021-11-20 DIAGNOSIS — I69392 Facial weakness following cerebral infarction: Secondary | ICD-10-CM | POA: Diagnosis not present

## 2021-11-20 DIAGNOSIS — I129 Hypertensive chronic kidney disease with stage 1 through stage 4 chronic kidney disease, or unspecified chronic kidney disease: Secondary | ICD-10-CM | POA: Diagnosis not present

## 2021-11-20 DIAGNOSIS — I69320 Aphasia following cerebral infarction: Secondary | ICD-10-CM | POA: Diagnosis not present

## 2021-11-20 DIAGNOSIS — I69398 Other sequelae of cerebral infarction: Secondary | ICD-10-CM | POA: Diagnosis not present

## 2021-11-20 DIAGNOSIS — R4781 Slurred speech: Secondary | ICD-10-CM | POA: Diagnosis not present

## 2021-11-20 DIAGNOSIS — I69393 Ataxia following cerebral infarction: Secondary | ICD-10-CM | POA: Diagnosis not present

## 2021-11-27 DIAGNOSIS — I739 Peripheral vascular disease, unspecified: Secondary | ICD-10-CM | POA: Diagnosis not present

## 2021-11-27 DIAGNOSIS — B351 Tinea unguium: Secondary | ICD-10-CM | POA: Diagnosis not present

## 2021-11-30 DIAGNOSIS — F331 Major depressive disorder, recurrent, moderate: Secondary | ICD-10-CM | POA: Diagnosis not present

## 2021-12-14 ENCOUNTER — Telehealth: Payer: Self-pay

## 2021-12-14 DIAGNOSIS — F331 Major depressive disorder, recurrent, moderate: Secondary | ICD-10-CM | POA: Diagnosis not present

## 2021-12-14 NOTE — Telephone Encounter (Signed)
Called pt to schedule AWV. Please schedule with health coach, Toria or Mikiya Nebergall. ? ?

## 2021-12-24 DIAGNOSIS — R1310 Dysphagia, unspecified: Secondary | ICD-10-CM | POA: Diagnosis not present

## 2021-12-24 DIAGNOSIS — D649 Anemia, unspecified: Secondary | ICD-10-CM | POA: Diagnosis not present

## 2021-12-24 DIAGNOSIS — K219 Gastro-esophageal reflux disease without esophagitis: Secondary | ICD-10-CM | POA: Diagnosis not present

## 2021-12-27 DIAGNOSIS — M6281 Muscle weakness (generalized): Secondary | ICD-10-CM | POA: Diagnosis not present

## 2021-12-27 DIAGNOSIS — H353122 Nonexudative age-related macular degeneration, left eye, intermediate dry stage: Secondary | ICD-10-CM | POA: Diagnosis not present

## 2021-12-27 DIAGNOSIS — H35372 Puckering of macula, left eye: Secondary | ICD-10-CM | POA: Diagnosis not present

## 2021-12-27 DIAGNOSIS — R2681 Unsteadiness on feet: Secondary | ICD-10-CM | POA: Diagnosis not present

## 2021-12-27 DIAGNOSIS — Z9181 History of falling: Secondary | ICD-10-CM | POA: Diagnosis not present

## 2021-12-27 DIAGNOSIS — H353211 Exudative age-related macular degeneration, right eye, with active choroidal neovascularization: Secondary | ICD-10-CM | POA: Diagnosis not present

## 2021-12-27 DIAGNOSIS — H35412 Lattice degeneration of retina, left eye: Secondary | ICD-10-CM | POA: Diagnosis not present

## 2021-12-27 DIAGNOSIS — H31091 Other chorioretinal scars, right eye: Secondary | ICD-10-CM | POA: Diagnosis not present

## 2021-12-28 DIAGNOSIS — F331 Major depressive disorder, recurrent, moderate: Secondary | ICD-10-CM | POA: Diagnosis not present

## 2021-12-31 DIAGNOSIS — R2681 Unsteadiness on feet: Secondary | ICD-10-CM | POA: Diagnosis not present

## 2021-12-31 DIAGNOSIS — Z9181 History of falling: Secondary | ICD-10-CM | POA: Diagnosis not present

## 2021-12-31 DIAGNOSIS — M6281 Muscle weakness (generalized): Secondary | ICD-10-CM | POA: Diagnosis not present

## 2022-01-01 DIAGNOSIS — R2681 Unsteadiness on feet: Secondary | ICD-10-CM | POA: Diagnosis not present

## 2022-01-01 DIAGNOSIS — M6281 Muscle weakness (generalized): Secondary | ICD-10-CM | POA: Diagnosis not present

## 2022-01-01 DIAGNOSIS — Z9181 History of falling: Secondary | ICD-10-CM | POA: Diagnosis not present

## 2022-01-03 DIAGNOSIS — Z9181 History of falling: Secondary | ICD-10-CM | POA: Diagnosis not present

## 2022-01-03 DIAGNOSIS — R2681 Unsteadiness on feet: Secondary | ICD-10-CM | POA: Diagnosis not present

## 2022-01-03 DIAGNOSIS — M6281 Muscle weakness (generalized): Secondary | ICD-10-CM | POA: Diagnosis not present

## 2022-01-04 DIAGNOSIS — E782 Mixed hyperlipidemia: Secondary | ICD-10-CM | POA: Diagnosis not present

## 2022-01-04 DIAGNOSIS — E119 Type 2 diabetes mellitus without complications: Secondary | ICD-10-CM | POA: Diagnosis not present

## 2022-01-04 DIAGNOSIS — E559 Vitamin D deficiency, unspecified: Secondary | ICD-10-CM | POA: Diagnosis not present

## 2022-01-04 DIAGNOSIS — E039 Hypothyroidism, unspecified: Secondary | ICD-10-CM | POA: Diagnosis not present

## 2022-01-04 DIAGNOSIS — I10 Essential (primary) hypertension: Secondary | ICD-10-CM | POA: Diagnosis not present

## 2022-01-04 DIAGNOSIS — D519 Vitamin B12 deficiency anemia, unspecified: Secondary | ICD-10-CM | POA: Diagnosis not present

## 2022-01-07 DIAGNOSIS — R2681 Unsteadiness on feet: Secondary | ICD-10-CM | POA: Diagnosis not present

## 2022-01-07 DIAGNOSIS — Z9181 History of falling: Secondary | ICD-10-CM | POA: Diagnosis not present

## 2022-01-07 DIAGNOSIS — M6281 Muscle weakness (generalized): Secondary | ICD-10-CM | POA: Diagnosis not present

## 2022-01-08 DIAGNOSIS — F331 Major depressive disorder, recurrent, moderate: Secondary | ICD-10-CM | POA: Diagnosis not present

## 2022-01-09 DIAGNOSIS — R2681 Unsteadiness on feet: Secondary | ICD-10-CM | POA: Diagnosis not present

## 2022-01-09 DIAGNOSIS — M6281 Muscle weakness (generalized): Secondary | ICD-10-CM | POA: Diagnosis not present

## 2022-01-09 DIAGNOSIS — Z9181 History of falling: Secondary | ICD-10-CM | POA: Diagnosis not present

## 2022-01-11 DIAGNOSIS — M6281 Muscle weakness (generalized): Secondary | ICD-10-CM | POA: Diagnosis not present

## 2022-01-11 DIAGNOSIS — R2681 Unsteadiness on feet: Secondary | ICD-10-CM | POA: Diagnosis not present

## 2022-01-11 DIAGNOSIS — Z9181 History of falling: Secondary | ICD-10-CM | POA: Diagnosis not present

## 2022-01-14 DIAGNOSIS — M6281 Muscle weakness (generalized): Secondary | ICD-10-CM | POA: Diagnosis not present

## 2022-01-14 DIAGNOSIS — I10 Essential (primary) hypertension: Secondary | ICD-10-CM | POA: Diagnosis not present

## 2022-01-14 DIAGNOSIS — R2681 Unsteadiness on feet: Secondary | ICD-10-CM | POA: Diagnosis not present

## 2022-01-14 DIAGNOSIS — Z9181 History of falling: Secondary | ICD-10-CM | POA: Diagnosis not present

## 2022-01-15 DIAGNOSIS — Z9181 History of falling: Secondary | ICD-10-CM | POA: Diagnosis not present

## 2022-01-15 DIAGNOSIS — R2681 Unsteadiness on feet: Secondary | ICD-10-CM | POA: Diagnosis not present

## 2022-01-15 DIAGNOSIS — M6281 Muscle weakness (generalized): Secondary | ICD-10-CM | POA: Diagnosis not present

## 2022-01-18 DIAGNOSIS — R2681 Unsteadiness on feet: Secondary | ICD-10-CM | POA: Diagnosis not present

## 2022-01-18 DIAGNOSIS — Z9181 History of falling: Secondary | ICD-10-CM | POA: Diagnosis not present

## 2022-01-18 DIAGNOSIS — M6281 Muscle weakness (generalized): Secondary | ICD-10-CM | POA: Diagnosis not present

## 2022-01-18 DIAGNOSIS — F331 Major depressive disorder, recurrent, moderate: Secondary | ICD-10-CM | POA: Diagnosis not present

## 2022-01-21 DIAGNOSIS — I10 Essential (primary) hypertension: Secondary | ICD-10-CM | POA: Diagnosis not present

## 2022-01-21 DIAGNOSIS — R2681 Unsteadiness on feet: Secondary | ICD-10-CM | POA: Diagnosis not present

## 2022-01-21 DIAGNOSIS — Z9181 History of falling: Secondary | ICD-10-CM | POA: Diagnosis not present

## 2022-01-21 DIAGNOSIS — M6281 Muscle weakness (generalized): Secondary | ICD-10-CM | POA: Diagnosis not present

## 2022-01-23 DIAGNOSIS — R2681 Unsteadiness on feet: Secondary | ICD-10-CM | POA: Diagnosis not present

## 2022-01-23 DIAGNOSIS — M6281 Muscle weakness (generalized): Secondary | ICD-10-CM | POA: Diagnosis not present

## 2022-01-23 DIAGNOSIS — Z9181 History of falling: Secondary | ICD-10-CM | POA: Diagnosis not present

## 2022-01-25 DIAGNOSIS — R2681 Unsteadiness on feet: Secondary | ICD-10-CM | POA: Diagnosis not present

## 2022-01-25 DIAGNOSIS — M6281 Muscle weakness (generalized): Secondary | ICD-10-CM | POA: Diagnosis not present

## 2022-01-25 DIAGNOSIS — Z9181 History of falling: Secondary | ICD-10-CM | POA: Diagnosis not present

## 2022-01-28 DIAGNOSIS — M6281 Muscle weakness (generalized): Secondary | ICD-10-CM | POA: Diagnosis not present

## 2022-01-28 DIAGNOSIS — R2681 Unsteadiness on feet: Secondary | ICD-10-CM | POA: Diagnosis not present

## 2022-01-28 DIAGNOSIS — Z9181 History of falling: Secondary | ICD-10-CM | POA: Diagnosis not present

## 2022-01-30 ENCOUNTER — Ambulatory Visit: Payer: Medicare Other | Admitting: Adult Health

## 2022-01-30 DIAGNOSIS — R2681 Unsteadiness on feet: Secondary | ICD-10-CM | POA: Diagnosis not present

## 2022-01-30 DIAGNOSIS — Z9181 History of falling: Secondary | ICD-10-CM | POA: Diagnosis not present

## 2022-01-30 DIAGNOSIS — M6281 Muscle weakness (generalized): Secondary | ICD-10-CM | POA: Diagnosis not present

## 2022-02-01 DIAGNOSIS — Z9181 History of falling: Secondary | ICD-10-CM | POA: Diagnosis not present

## 2022-02-01 DIAGNOSIS — R2681 Unsteadiness on feet: Secondary | ICD-10-CM | POA: Diagnosis not present

## 2022-02-01 DIAGNOSIS — M6281 Muscle weakness (generalized): Secondary | ICD-10-CM | POA: Diagnosis not present

## 2022-02-02 DIAGNOSIS — R2681 Unsteadiness on feet: Secondary | ICD-10-CM | POA: Diagnosis not present

## 2022-02-02 DIAGNOSIS — M6281 Muscle weakness (generalized): Secondary | ICD-10-CM | POA: Diagnosis not present

## 2022-02-02 DIAGNOSIS — Z9181 History of falling: Secondary | ICD-10-CM | POA: Diagnosis not present

## 2022-02-03 DIAGNOSIS — F331 Major depressive disorder, recurrent, moderate: Secondary | ICD-10-CM | POA: Diagnosis not present

## 2022-02-04 DIAGNOSIS — I129 Hypertensive chronic kidney disease with stage 1 through stage 4 chronic kidney disease, or unspecified chronic kidney disease: Secondary | ICD-10-CM | POA: Diagnosis not present

## 2022-02-04 DIAGNOSIS — N189 Chronic kidney disease, unspecified: Secondary | ICD-10-CM | POA: Diagnosis not present

## 2022-02-04 DIAGNOSIS — Z8673 Personal history of transient ischemic attack (TIA), and cerebral infarction without residual deficits: Secondary | ICD-10-CM | POA: Diagnosis not present

## 2022-02-07 DIAGNOSIS — Z9181 History of falling: Secondary | ICD-10-CM | POA: Diagnosis not present

## 2022-02-07 DIAGNOSIS — R2681 Unsteadiness on feet: Secondary | ICD-10-CM | POA: Diagnosis not present

## 2022-02-07 DIAGNOSIS — M6281 Muscle weakness (generalized): Secondary | ICD-10-CM | POA: Diagnosis not present

## 2022-02-08 DIAGNOSIS — F5102 Adjustment insomnia: Secondary | ICD-10-CM | POA: Diagnosis not present

## 2022-02-11 DIAGNOSIS — M6281 Muscle weakness (generalized): Secondary | ICD-10-CM | POA: Diagnosis not present

## 2022-02-11 DIAGNOSIS — R2681 Unsteadiness on feet: Secondary | ICD-10-CM | POA: Diagnosis not present

## 2022-02-11 DIAGNOSIS — Z9181 History of falling: Secondary | ICD-10-CM | POA: Diagnosis not present

## 2022-02-13 DIAGNOSIS — Z9181 History of falling: Secondary | ICD-10-CM | POA: Diagnosis not present

## 2022-02-13 DIAGNOSIS — R2681 Unsteadiness on feet: Secondary | ICD-10-CM | POA: Diagnosis not present

## 2022-02-13 DIAGNOSIS — M6281 Muscle weakness (generalized): Secondary | ICD-10-CM | POA: Diagnosis not present

## 2022-02-15 ENCOUNTER — Encounter: Payer: Self-pay | Admitting: Family Medicine

## 2022-02-15 ENCOUNTER — Ambulatory Visit (INDEPENDENT_AMBULATORY_CARE_PROVIDER_SITE_OTHER): Payer: Medicare Other | Admitting: Family Medicine

## 2022-02-15 VITALS — BP 121/68 | HR 47 | Temp 97.6°F | Ht 71.0 in | Wt 197.0 lb

## 2022-02-15 DIAGNOSIS — I639 Cerebral infarction, unspecified: Secondary | ICD-10-CM | POA: Diagnosis not present

## 2022-02-15 DIAGNOSIS — R600 Localized edema: Secondary | ICD-10-CM | POA: Diagnosis not present

## 2022-02-15 DIAGNOSIS — I6930 Unspecified sequelae of cerebral infarction: Secondary | ICD-10-CM | POA: Diagnosis not present

## 2022-02-15 DIAGNOSIS — Z862 Personal history of diseases of the blood and blood-forming organs and certain disorders involving the immune mechanism: Secondary | ICD-10-CM | POA: Diagnosis not present

## 2022-02-15 DIAGNOSIS — F331 Major depressive disorder, recurrent, moderate: Secondary | ICD-10-CM | POA: Diagnosis not present

## 2022-02-15 DIAGNOSIS — R001 Bradycardia, unspecified: Secondary | ICD-10-CM

## 2022-02-15 DIAGNOSIS — I10 Essential (primary) hypertension: Secondary | ICD-10-CM

## 2022-02-15 LAB — CBC
HCT: 37.8 % — ABNORMAL LOW (ref 39.0–52.0)
Hemoglobin: 12.8 g/dL — ABNORMAL LOW (ref 13.0–17.0)
MCHC: 33.9 g/dL (ref 30.0–36.0)
MCV: 95.6 fl (ref 78.0–100.0)
Platelets: 324 10*3/uL (ref 150.0–400.0)
RBC: 3.95 Mil/uL — ABNORMAL LOW (ref 4.22–5.81)
RDW: 14.9 % (ref 11.5–15.5)
WBC: 6.2 10*3/uL (ref 4.0–10.5)

## 2022-02-15 NOTE — Addendum Note (Signed)
Addended by: Kavin Leech on: 02/15/2022 04:20 PM   Modules accepted: Orders

## 2022-02-15 NOTE — Progress Notes (Signed)
OFFICE VISIT  02/15/2022  CC: f/u htn, edema, hx cva HPI:    Glen Barnett is a 88 y.o. male who presents accompanied by his son Glen Barnett for 3 mo f/u HTN, LE edema, gait instability, hx of CVA with residual deficit, hx of IDA/chronic GI bleed. A/P as of last visit: "#1 History of CVA, with slight dysarthria and left lower facial weakness as residual. He is finished with PT/OT he gets ongoing PT/OT. As per HPI above, plan is to continue with risk factor management--> good BP control, statin, aspirin.   He has his usual amount of instability with walking.  Stressed the importance of him using his walker everywhere he goes.   #2 hypertension, well controlled.  Continue valsartan 320 mg a day, HCTZ 50 mg a day, and clonidine 0.1 mg twice a day. Electrolytes and creatinine today.  3.  Chronic lower extremity edema, venous insufficiency. Stable.  He knows to try to restrict sodium, elevate as needed.   #4 history of iron deficiency anemia, chronic GI bleed. As per past medical history section: Monitor CBC, no invasive evaluation. "  INTERIM HX: He feels quite well. Says nursing home staff checks his blood pressure daily and he says it does go from normal to moderately elevated, "up-and-down ". He denies shortness of breath, chest pain, dizziness, presyncope, palpitations, or change in lower extremities. He is eating well, drinking well, and says he urinates and has bowel movements fine.  He is compliant with his aspirin, statin, clonidine, HCTZ, and valsartan.  ROS as above, plus--> no fevers, no wheezing, no cough,  no HAs, no rashes, no melena/hematochezia.  No polyuria or polydipsia.  No myalgias or arthralgias.  No focal weakness, paresthesias, or tremors.  No acute vision or hearing abnormalities.  No dysuria or unusual/new urinary urgency or frequency.   No n/v/d or abd pain.   Past Medical History:  Diagnosis Date   Ataxia 2014/15   brain MRI 02/2014 showed encephalomalacia parietal and  occip lobes c/w old infarcts, plus diffuse cerebral atrophy, o/w normal   Cataract    Chronic renal insufficiency, stage 2 (mild) 12/2016   GFR 60s-70s   CVA (cerebral vascular accident) ([facility]) 07/2021   R MCA territory. No obstruction/stenosis on CTA head and neck.  No a-fib in hosp.  Echo with low-normal LV fxn.  Outpt rhythm monitoring planned.  ASA + plavix x 3wks, then ASA alone.   DDD (degenerative disc disease)    L-spine   Dermatitis    ?Contact derm? per [facility] MD: Dr. Threasa Alpha (05/09/16): punch bx 06/18/16: chronic eczematous derm/contact derm, neg fungal stain.  Allergy (RAST) testing NEG.   GERD (gastroesophageal reflux disease)    Heme positive stool 02/2012   colonoscopy normal.  EGD showed small hiatal hernia with associated Cameron's erosions.  Hemoccults NEG x 3 09/2014. Hem + iron def 07/2019->conservative mgmt.   Hyperlipidemia    Hypertension    Insomnia    poor sleep hygiene.  Clonidine for his HTN has helped a little.   Iron deficiency anemia 02/2012; 07/2019   2013 Mild iron deficiency->small upper GI erosions, colonoscopy normal.  07/2019->Hb 7.5, heme+, GI eval->pt chose no endoscopy->IV iron and Hb monitoring, PO iron indefinitely: Hb/iron normal on recheck 10/2019 and 12/2019.   Macular degeneration, age related    exudative R eye; nonexudative L eye.  Vitreous degeneration bilat.   Obesity (BMI 30-39.9)    Osteoarthritis, multiple sites    Retinal hemorrhage 2016   w/ retinal  detachment (right eye) : anticoagulants stopped due to this   Venous insufficiency of both lower extremities     Past Surgical History:  Procedure Laterality Date   APPENDECTOMY  09/30/1933   cataract surg     Bilateral/ 08-13   COLONOSCOPY  05/12/2012   for iron def anemia--NORMAL RESULT   ESOPHAGOGASTRODUODENOSCOPY  06/17/2012   Small hiatal hernia with associated Cameron's erosions--likely source of his mild iron def anemia.   NASAL SINUS SURGERY     1998   TONSILLECTOMY   09/30/1933   TRANSTHORACIC ECHOCARDIOGRAM  08/11/2021   2022->(in setting of acute CVA) EF 50-55%, no thrombus, WMA c/w ischemic CM vs stress-induced CM.  Valves ok.    Outpatient Medications Prior to Visit  Medication Sig Dispense Refill   Aflibercept (EYLEA IO) Inject into the eye. One injection into right eye every 10 weeks     aspirin EC 81 MG EC tablet Take 1 tablet (81 mg total) by mouth daily. Swallow whole. 30 tablet 11   atorvastatin (LIPITOR) 40 MG tablet Take 1 tablet (40 mg total) by mouth daily.     Cholecalciferol (VITAMIN D3) 2000 UNITS TABS Take 1 tablet by mouth daily.     cloNIDine (CATAPRES) 0.1 MG tablet Take 1 tablet (0.1 mg total) by mouth 2 (two) times daily. 180 tablet 3   hydrochlorothiazide (HYDRODIURIL) 50 MG tablet Take 1 tablet (50 mg total) by mouth daily. 90 tablet 3   Multiple Vitamins-Minerals (PRESERVISION AREDS 2 PO) Take 1 tablet by mouth daily.     omeprazole (PRILOSEC) 40 MG capsule Take 1 capsule (40 mg total) by mouth daily. TAKE 1 CAPSULE BY MOUTH EVERY DAY 90 capsule 3   valsartan (DIOVAN) 320 MG tablet Take 1 tablet (320 mg total) by mouth daily. 90 tablet 3   No facility-administered medications prior to visit.    No Known Allergies  ROS As per HPI  PE:    02/15/2022    1:01 PM 11/09/2021   11:09 AM 10/31/2021    9:53 AM  Vitals with BMI  Height '5\' 11"'$  '5\' 11"'$  '5\' 11"'$   Weight 197 lbs 195 lbs 10 oz 194 lbs 3 oz  BMI 27.49 13.08 65.7  Systolic 846 962 952  Diastolic 68 69 84  Pulse 47 60 71     Physical Exam  Gen: Alert, well appearing.  Glen Barnett is oriented to person, place, time, and situation. AFFECT: pleasant, lucid thought and speech. CV: Regular, brady to 50, no m/r/g Chest is clear, no wheezing or rales. Normal symmetric air entry throughout both lung fields. No chest wall deformities or tenderness. EXT: 1+ bilat LL pitting edema without erythema or skin breakdown. Neuro: CN 2-12 intact bilaterally except L lower face paresis  with smiling, strength 5/5 in proximal and distal upper extremities and lower extremities bilaterally.  No sensory deficits.  No tremor.  FNF normal. Gait mildly unsteady.  No pronator drift.  LABS:  Last CBC Lab Results  Component Value Date   WBC 6.4 11/09/2021   HGB 11.9 (L) 11/09/2021   HCT 36.4 (L) 11/09/2021   MCV 98.2 11/09/2021   MCH 32.6 08/15/2021   RDW 15.0 11/09/2021   PLT 343.0 11/09/2021   Lab Results  Component Value Date   IRON 104 07/26/2021   TIBC 318 07/26/2021   FERRITIN 64 84/13/2440   Last metabolic panel Lab Results  Component Value Date   GLUCOSE 105 (H) 11/09/2021   NA 140 11/09/2021   K 3.8 11/09/2021  CL 100 11/09/2021   CO2 35 (H) 11/09/2021   BUN 21 11/09/2021   CREATININE 0.83 11/09/2021   GFRNONAA >60 08/15/2021   CALCIUM 9.1 11/09/2021   PROT 5.7 (L) 11/09/2021   ALBUMIN 3.7 11/09/2021   BILITOT 0.5 11/09/2021   ALKPHOS 86 11/09/2021   AST 14 11/09/2021   ALT 11 11/09/2021   ANIONGAP 7 08/15/2021   IMPRESSION AND PLAN:  #1 hypertension, not ideal control but will hold off on any change in therapy at this time. He expresses strong desire to not get on any further medications. We will try to get some BP and heart rate numbers from his nursing home staff, especially in light of his bradycardia today. BMET, Mag, cbc.  2) History of CVA, with slight dysarthria and left lower facial weakness as residual. He is finished with PT/OT he gets ongoing PT/OT. Encouraged him to use a cane everywhere he goes.  3) Chronic lower extremity edema, venous insufficiency. Stable.  He knows to try to restrict sodium, elevate as needed.   #4 history of iron deficiency anemia, chronic GI bleed. As per past medical history section: Monitor CBC, no invasive evaluation CBC today.  An After Visit Summary was printed and given to the Glen Barnett.  FOLLOW UP: Return in about 3 months (around 05/18/2022) for routine chronic illness f/u.  Signed:  Crissie Sickles, MD           02/15/2022

## 2022-02-16 LAB — BASIC METABOLIC PANEL
BUN: 19 mg/dL (ref 7–25)
CO2: 28 mmol/L (ref 20–32)
Calcium: 9.1 mg/dL (ref 8.6–10.3)
Chloride: 99 mmol/L (ref 98–110)
Creat: 0.91 mg/dL (ref 0.70–1.22)
Glucose, Bld: 104 mg/dL — ABNORMAL HIGH (ref 65–99)
Potassium: 4 mmol/L (ref 3.5–5.3)
Sodium: 138 mmol/L (ref 135–146)

## 2022-02-16 LAB — MAGNESIUM: Magnesium: 2 mg/dL (ref 1.5–2.5)

## 2022-02-18 DIAGNOSIS — Z9181 History of falling: Secondary | ICD-10-CM | POA: Diagnosis not present

## 2022-02-18 DIAGNOSIS — M6281 Muscle weakness (generalized): Secondary | ICD-10-CM | POA: Diagnosis not present

## 2022-02-18 DIAGNOSIS — R2681 Unsteadiness on feet: Secondary | ICD-10-CM | POA: Diagnosis not present

## 2022-02-20 DIAGNOSIS — R2681 Unsteadiness on feet: Secondary | ICD-10-CM | POA: Diagnosis not present

## 2022-02-20 DIAGNOSIS — M6281 Muscle weakness (generalized): Secondary | ICD-10-CM | POA: Diagnosis not present

## 2022-02-20 DIAGNOSIS — Z9181 History of falling: Secondary | ICD-10-CM | POA: Diagnosis not present

## 2022-02-22 DIAGNOSIS — F5102 Adjustment insomnia: Secondary | ICD-10-CM | POA: Diagnosis not present

## 2022-03-01 DIAGNOSIS — F331 Major depressive disorder, recurrent, moderate: Secondary | ICD-10-CM | POA: Diagnosis not present

## 2022-03-04 DIAGNOSIS — E782 Mixed hyperlipidemia: Secondary | ICD-10-CM | POA: Diagnosis not present

## 2022-03-04 DIAGNOSIS — D649 Anemia, unspecified: Secondary | ICD-10-CM | POA: Diagnosis not present

## 2022-03-04 DIAGNOSIS — I10 Essential (primary) hypertension: Secondary | ICD-10-CM | POA: Diagnosis not present

## 2022-03-08 DIAGNOSIS — F5101 Primary insomnia: Secondary | ICD-10-CM | POA: Diagnosis not present

## 2022-03-11 DIAGNOSIS — H35033 Hypertensive retinopathy, bilateral: Secondary | ICD-10-CM | POA: Diagnosis not present

## 2022-03-11 DIAGNOSIS — H353122 Nonexudative age-related macular degeneration, left eye, intermediate dry stage: Secondary | ICD-10-CM | POA: Diagnosis not present

## 2022-03-11 DIAGNOSIS — H353211 Exudative age-related macular degeneration, right eye, with active choroidal neovascularization: Secondary | ICD-10-CM | POA: Diagnosis not present

## 2022-03-11 DIAGNOSIS — H35372 Puckering of macula, left eye: Secondary | ICD-10-CM | POA: Diagnosis not present

## 2022-03-21 DIAGNOSIS — H02839 Dermatochalasis of unspecified eye, unspecified eyelid: Secondary | ICD-10-CM | POA: Diagnosis not present

## 2022-03-21 DIAGNOSIS — H524 Presbyopia: Secondary | ICD-10-CM | POA: Diagnosis not present

## 2022-03-21 DIAGNOSIS — H353232 Exudative age-related macular degeneration, bilateral, with inactive choroidal neovascularization: Secondary | ICD-10-CM | POA: Diagnosis not present

## 2022-03-21 DIAGNOSIS — H43813 Vitreous degeneration, bilateral: Secondary | ICD-10-CM | POA: Diagnosis not present

## 2022-03-21 DIAGNOSIS — Z961 Presence of intraocular lens: Secondary | ICD-10-CM | POA: Diagnosis not present

## 2022-03-22 DIAGNOSIS — F5101 Primary insomnia: Secondary | ICD-10-CM | POA: Diagnosis not present

## 2022-03-25 DIAGNOSIS — L03115 Cellulitis of right lower limb: Secondary | ICD-10-CM | POA: Diagnosis not present

## 2022-03-29 DIAGNOSIS — I129 Hypertensive chronic kidney disease with stage 1 through stage 4 chronic kidney disease, or unspecified chronic kidney disease: Secondary | ICD-10-CM | POA: Diagnosis not present

## 2022-03-29 DIAGNOSIS — F339 Major depressive disorder, recurrent, unspecified: Secondary | ICD-10-CM | POA: Diagnosis not present

## 2022-04-01 DIAGNOSIS — I639 Cerebral infarction, unspecified: Secondary | ICD-10-CM | POA: Diagnosis not present

## 2022-04-01 DIAGNOSIS — K219 Gastro-esophageal reflux disease without esophagitis: Secondary | ICD-10-CM | POA: Diagnosis not present

## 2022-04-01 DIAGNOSIS — M6281 Muscle weakness (generalized): Secondary | ICD-10-CM | POA: Diagnosis not present

## 2022-04-01 DIAGNOSIS — L03115 Cellulitis of right lower limb: Secondary | ICD-10-CM | POA: Diagnosis not present

## 2022-04-04 DIAGNOSIS — F329 Major depressive disorder, single episode, unspecified: Secondary | ICD-10-CM | POA: Diagnosis not present

## 2022-04-04 DIAGNOSIS — F339 Major depressive disorder, recurrent, unspecified: Secondary | ICD-10-CM | POA: Diagnosis not present

## 2022-04-05 DIAGNOSIS — F5101 Primary insomnia: Secondary | ICD-10-CM | POA: Diagnosis not present

## 2022-04-08 DIAGNOSIS — F339 Major depressive disorder, recurrent, unspecified: Secondary | ICD-10-CM | POA: Diagnosis not present

## 2022-04-25 ENCOUNTER — Ambulatory Visit: Payer: Medicare Other | Admitting: Family Medicine

## 2022-04-25 NOTE — Progress Notes (Deleted)
OFFICE VISIT  04/25/2022  CC: No chief complaint on file.  Patient is a 88 y.o. male who presents accompanied by *** for cough.  HPI: ***   Past Medical History:  Diagnosis Date   Ataxia 2014/15   brain MRI 02/2014 showed encephalomalacia parietal and occip lobes c/w old infarcts, plus diffuse cerebral atrophy, o/w normal   Cataract    Chronic renal insufficiency, stage 2 (mild) 12/2016   GFR 60s-70s   CVA (cerebral vascular accident) ([facility]) 07/2021   R MCA territory. No obstruction/stenosis on CTA head and neck.  No a-fib in hosp.  Echo with low-normal LV fxn.  Outpt rhythm monitoring planned.  ASA + plavix x 3wks, then ASA alone.   DDD (degenerative disc disease)    L-spine   Dermatitis    ?Contact derm? per [facility] MD: Dr. Threasa Alpha (05/09/16): punch bx 06/18/16: chronic eczematous derm/contact derm, neg fungal stain.  Allergy (RAST) testing NEG.   GERD (gastroesophageal reflux disease)    Heme positive stool 02/2012   colonoscopy normal.  EGD showed small hiatal hernia with associated Cameron's erosions.  Hemoccults NEG x 3 09/2014. Hem + iron def 07/2019->conservative mgmt.   Hyperlipidemia    Hypertension    Insomnia    poor sleep hygiene.  Clonidine for his HTN has helped a little.   Iron deficiency anemia 02/2012; 07/2019   2013 Mild iron deficiency->small upper GI erosions, colonoscopy normal.  07/2019->Hb 7.5, heme+, GI eval->pt chose no endoscopy->IV iron and Hb monitoring, PO iron indefinitely: Hb/iron normal on recheck 10/2019 and 12/2019.   Macular degeneration, age related    exudative R eye; nonexudative L eye.  Vitreous degeneration bilat.   Obesity (BMI 30-39.9)    Osteoarthritis, multiple sites    Retinal hemorrhage 2016   w/ retinal detachment (right eye) : anticoagulants stopped due to this   Venous insufficiency of both lower extremities     Past Surgical History:  Procedure Laterality Date   APPENDECTOMY  09/30/1933   cataract surg     Bilateral/  08-13   COLONOSCOPY  05/12/2012   for iron def anemia--NORMAL RESULT   ESOPHAGOGASTRODUODENOSCOPY  06/17/2012   Small hiatal hernia with associated Cameron's erosions--likely source of his mild iron def anemia.   NASAL SINUS SURGERY     1998   TONSILLECTOMY  09/30/1933   TRANSTHORACIC ECHOCARDIOGRAM  08/11/2021   2022->(in setting of acute CVA) EF 50-55%, no thrombus, WMA c/w ischemic CM vs stress-induced CM.  Valves ok.    Outpatient Medications Prior to Visit  Medication Sig Dispense Refill   Aflibercept (EYLEA IO) Inject into the eye. One injection into right eye every 10 weeks     aspirin EC 81 MG EC tablet Take 1 tablet (81 mg total) by mouth daily. Swallow whole. 30 tablet 11   atorvastatin (LIPITOR) 40 MG tablet Take 1 tablet (40 mg total) by mouth daily.     Cholecalciferol (VITAMIN D3) 2000 UNITS TABS Take 1 tablet by mouth daily.     cloNIDine (CATAPRES) 0.1 MG tablet Take 1 tablet (0.1 mg total) by mouth 2 (two) times daily. 180 tablet 3   hydrochlorothiazide (HYDRODIURIL) 50 MG tablet Take 1 tablet (50 mg total) by mouth daily. 90 tablet 3   Multiple Vitamins-Minerals (PRESERVISION AREDS 2 PO) Take 1 tablet by mouth daily.     omeprazole (PRILOSEC) 40 MG capsule Take 1 capsule (40 mg total) by mouth daily. TAKE 1 CAPSULE BY MOUTH EVERY DAY 90 capsule 3  valsartan (DIOVAN) 320 MG tablet Take 1 tablet (320 mg total) by mouth daily. 90 tablet 3   No facility-administered medications prior to visit.    No Known Allergies  ROS As per HPI  PE:    02/15/2022    1:01 PM 11/09/2021   11:09 AM 10/31/2021    9:53 AM  Vitals with BMI  Height '5\' 11"'$  '5\' 11"'$  '5\' 11"'$   Weight 197 lbs 195 lbs 10 oz 194 lbs 3 oz  BMI 27.49 29.24 46.2  Systolic 863 817 711  Diastolic 68 69 84  Pulse 47 60 71     Physical Exam  ***  LABS:  Last CBC Lab Results  Component Value Date   WBC 6.2 02/15/2022   HGB 12.8 (L) 02/15/2022   HCT 37.8 (L) 02/15/2022   MCV 95.6 02/15/2022   MCH 32.6  08/15/2021   RDW 14.9 02/15/2022   PLT 324.0 02/15/2022   Lab Results  Component Value Date   IRON 104 07/26/2021   TIBC 318 07/26/2021   FERRITIN 64 65/79/0383   Last metabolic panel Lab Results  Component Value Date   GLUCOSE 104 (H) 02/15/2022   NA 138 02/15/2022   K 4.0 02/15/2022   CL 99 02/15/2022   CO2 28 02/15/2022   BUN 19 02/15/2022   CREATININE 0.91 02/15/2022   GFRNONAA >60 08/15/2021   CALCIUM 9.1 02/15/2022   PROT 5.7 (L) 11/09/2021   ALBUMIN 3.7 11/09/2021   BILITOT 0.5 11/09/2021   ALKPHOS 86 11/09/2021   AST 14 11/09/2021   ALT 11 11/09/2021   ANIONGAP 7 08/15/2021   IMPRESSION AND PLAN:  No problem-specific Assessment & Plan notes found for this encounter.   An After Visit Summary was printed and given to the patient.  FOLLOW UP: No follow-ups on file.  Signed:  Crissie Sickles, MD           04/25/2022

## 2022-05-01 ENCOUNTER — Encounter (HOSPITAL_BASED_OUTPATIENT_CLINIC_OR_DEPARTMENT_OTHER): Payer: Self-pay | Admitting: Emergency Medicine

## 2022-05-01 ENCOUNTER — Emergency Department (HOSPITAL_BASED_OUTPATIENT_CLINIC_OR_DEPARTMENT_OTHER)
Admission: EM | Admit: 2022-05-01 | Discharge: 2022-05-01 | Disposition: A | Discharge status: Home / Self Care | Payer: Medicare Other | Attending: Emergency Medicine | Admitting: Emergency Medicine

## 2022-05-01 ENCOUNTER — Other Ambulatory Visit: Payer: Self-pay

## 2022-05-01 DIAGNOSIS — Z7982 Long term (current) use of aspirin: Secondary | ICD-10-CM | POA: Diagnosis not present

## 2022-05-01 DIAGNOSIS — L600 Ingrowing nail: Secondary | ICD-10-CM | POA: Insufficient documentation

## 2022-05-01 DIAGNOSIS — L089 Local infection of the skin and subcutaneous tissue, unspecified: Secondary | ICD-10-CM | POA: Diagnosis not present

## 2022-05-01 DIAGNOSIS — M79674 Pain in right toe(s): Secondary | ICD-10-CM | POA: Diagnosis present

## 2022-05-01 MED ORDER — LIDOCAINE HCL 2 % IJ SOLN
10.0000 mL | Freq: Once | INTRAMUSCULAR | Status: AC
Start: 2022-05-01 — End: 2022-05-01
  Administered 2022-05-01: 200 mg
  Filled 2022-05-01: qty 20

## 2022-05-01 NOTE — ED Triage Notes (Signed)
Pt arrives to ED with c/o right great infected toenail and toe pain x3 days ago.

## 2022-05-01 NOTE — ED Provider Notes (Signed)
[facility] EMERGENCY DEPT Provider Note   CSN: [id number] Arrival date & time: 05/01/22  1425     History  Chief Complaint  Patient presents with   Toe Pain    Glen Barnett is a 88 y.o. male who presents to the emergency department for right great infected toenail and toe pain x 3 days ago.  Reports he previously had his left great toenail fall off on its own.  Has never seen a podiatrist.  Coming from nursing facility.   Toe Pain       Home Medications Prior to Admission medications   Medication Sig Start Date End Date Taking? Authorizing Provider  Aflibercept (EYLEA IO) Inject into the eye. One injection into right eye every 10 weeks    [provider]  aspirin EC 81 MG EC tablet Take 1 tablet (81 mg total) by mouth daily. Swallow whole. 08/16/21   Antonieta Pert, MD  atorvastatin (LIPITOR) 40 MG tablet Take 1 tablet (40 mg total) by mouth daily. 08/16/21   Antonieta Pert, MD  Cholecalciferol (VITAMIN D3) 2000 UNITS TABS Take 1 tablet by mouth daily.    [provider]  cloNIDine (CATAPRES) 0.1 MG tablet Take 1 tablet (0.1 mg total) by mouth 2 (two) times daily. 11/09/21   McGowen, Adrian Blackwater, MD  hydrochlorothiazide (HYDRODIURIL) 50 MG tablet Take 1 tablet (50 mg total) by mouth daily. 11/09/21   McGowen, Adrian Blackwater, MD  Multiple Vitamins-Minerals (PRESERVISION AREDS 2 PO) Take 1 tablet by mouth daily.    [provider]  omeprazole (PRILOSEC) 40 MG capsule Take 1 capsule (40 mg total) by mouth daily. TAKE 1 CAPSULE BY MOUTH EVERY DAY 11/09/21   McGowen, Adrian Blackwater, MD  valsartan (DIOVAN) 320 MG tablet Take 1 tablet (320 mg total) by mouth daily. 11/09/21   McGowen, Adrian Blackwater, MD      Allergies    Patient has no known allergies.    Review of Systems   Review of Systems  Skin:        Toenail pain  All other systems reviewed and are negative.   Physical Exam Updated Vital Signs BP (!) 225/93 (BP Location: Right Arm)   Pulse 61    Temp 97.9 F (36.6 C) (Oral)   Resp 19   Ht '5\' 10"'$  (1.778 m)   Wt 83.9 kg   SpO2 97%   BMI 26.54 kg/m  Physical Exam Vitals and nursing note reviewed.  Constitutional:      Appearance: Normal appearance.  HENT:     Head: Normocephalic and atraumatic.  Eyes:     Conjunctiva/sclera: Conjunctivae normal.  Pulmonary:     Effort: Pulmonary effort is normal. No respiratory distress.  Feet:     Right foot:     Toenail Condition: Right toenails are abnormally thick and long.     Left foot:     Toenail Condition: Left toenails are abnormally thick.     Comments: Abnormally thick right great toenail, mostly detached except for left lateral corner. Skin:    General: Skin is warm and dry.  Neurological:     Mental Status: He is alert.  Psychiatric:        Mood and Affect: Mood normal.        Behavior: Behavior normal.     ED Results / Procedures / Treatments   Labs (all labs ordered are listed, but only abnormal results are displayed) Labs Reviewed - No data to display  EKG None  Radiology No results found.  Procedures .Nerve Block  Date/Time: 05/01/2022 5:30 PM  Performed by: Kateri Plummer, PA-C Authorized by: Kateri Plummer, PA-C   Consent:    Consent obtained:  Verbal   Consent given by:  Patient   Risks, benefits, and alternatives were discussed: yes     Risks discussed:  Unsuccessful block, pain and bleeding Universal protocol:    Procedure explained and questions answered to patient or proxy's satisfaction: yes     Patient identity confirmed:  Verbally with patient and provided demographic data Indications:    Indications:  Procedural anesthesia Location:    Body area:  Lower extremity   Lower extremity nerve blocked: Right great toe digital.   Laterality:  Right Pre-procedure details:    Skin preparation:  Alcohol   Preparation: Patient was prepped and draped in usual sterile fashion   Skin anesthesia:    Skin anesthesia method:  None Procedure  details:    Block needle gauge:  25 G   Anesthesia technique comment:  Modified transthecal   Anesthetic injected:  Lidocaine 2% w/o epi   Steroid injected:  None   Additive injected:  None   Injection procedure:  Anatomic landmarks palpated Post-procedure details:    Dressing:  None   Outcome:  Anesthesia achieved   Procedure completion:  Tolerated well, no immediate complications .Nail Removal  Date/Time: 05/01/2022 5:31 PM  Performed by: Kateri Plummer, PA-C Authorized by: Kateri Plummer, PA-C   Consent:    Consent obtained:  Verbal   Consent given by:  Patient   Risks, benefits, and alternatives were discussed: yes     Risks discussed:  Bleeding, incomplete removal, infection, pain and permanent nail deformity Universal protocol:    Procedure explained and questions answered to patient or proxy's satisfaction: yes     Patient identity confirmed:  Provided demographic data Location:    Foot:  R big toe Pre-procedure details:    Skin preparation:  Alcohol   Preparation: Patient was prepped and draped in the usual sterile fashion   Anesthesia:    Anesthesia method:  Nerve block   Block location:  Right great toe digital   Block needle gauge:  25 G   Block anesthetic:  Lidocaine 2% w/o epi   Block technique:  Modified transthecal   Block injection procedure:  Anatomic landmarks palpated   Block outcome:  Anesthesia achieved Nail Removal:    Nail removed:  Complete   Nail bed repaired: no   Ingrown nail:    Wedge excision of skin: yes     Nail matrix removed or ablated:  None Post-procedure details:    Dressing:  Non-adhesive packing strip and antibiotic ointment   Procedure completion:  Tolerated well, no immediate complications     Medications Ordered in ED Medications  lidocaine (XYLOCAINE) 2 % (with pres) injection 200 mg (200 mg Infiltration Given 05/01/22 1651)    ED Course/ Medical Decision Making/ A&P                           Medical Decision  Making Risk Prescription drug management.   Patient is a 88 year old male who presents the emergency department with concern for right ingrown toenail.  Has been having pain in that toe for the past 3 days.  On exam patient has abnormally thick and long toenails, right great toenail almost entirely detached.  Hanging on by left lateral nail fold.   Digital  block performed using 2% lidocaine.  Toenail was removed and area was cleaned thoroughly with normal saline.  Bandaged with antibiotic dressing.  Patient given referral to podiatry for follow-up.  Given instructions for good wound care.  He stable for discharge back to his facility, and is agreeable to the plan.        Final Clinical Impression(s) / ED Diagnoses Final diagnoses:  Ingrown right big toenail    Rx / DC Orders ED Discharge Orders          Ordered    Ambulatory referral to Podiatry       Comments: Recurrent ingrown toenails, right great toenail removed on 8/2 at ER   05/01/22 1655           Portions of this report may have been transcribed using voice recognition software. Every effort was made to ensure accuracy; however, inadvertent computerized transcription errors may be present.    Estill Cotta 05/01/22 1733    Dorie Rank, MD 05/06/22 [phone]

## 2022-05-01 NOTE — Discharge Instructions (Addendum)
You were seen in the emergency department for toe pain.  You had a toe nail that was partly ingrown and partly coming off. We have pulled it off for you and dressed it.  I have sent a referral to the podiatrist for you to follow up with. Keep antibiotic ointment on the toe nail bed. Change the dressing anytime it gets wet.  Watch out for signs of infection such as increased redness, tenderness, or drainage of pus.

## 2022-05-03 DIAGNOSIS — F5101 Primary insomnia: Secondary | ICD-10-CM | POA: Diagnosis not present

## 2022-05-06 DIAGNOSIS — L089 Local infection of the skin and subcutaneous tissue, unspecified: Secondary | ICD-10-CM | POA: Diagnosis not present

## 2022-05-13 ENCOUNTER — Ambulatory Visit (INDEPENDENT_AMBULATORY_CARE_PROVIDER_SITE_OTHER): Payer: Medicare Other | Admitting: Podiatry

## 2022-05-13 DIAGNOSIS — F339 Major depressive disorder, recurrent, unspecified: Secondary | ICD-10-CM | POA: Diagnosis not present

## 2022-05-13 DIAGNOSIS — B351 Tinea unguium: Secondary | ICD-10-CM | POA: Diagnosis not present

## 2022-05-13 DIAGNOSIS — M79675 Pain in left toe(s): Secondary | ICD-10-CM

## 2022-05-13 DIAGNOSIS — M79674 Pain in right toe(s): Secondary | ICD-10-CM | POA: Diagnosis not present

## 2022-05-14 NOTE — Progress Notes (Signed)
  Subjective:  Patient ID: Glen Barnett, male    DOB: Oct 18, 1926,  MRN: [id number]  Chief Complaint  Patient presents with   Nail Problem    Patient had right great toenail removed about 2 weeks ago. Denies nausea, vomiting, fever and chills.     88 y.o. male presents with the above complaint. History confirmed with patient.  Says it is healing well not giving him any issues  Objective:  Physical Exam: warm, good capillary refill, no trophic changes or ulcerative lesions, normal DP and PT pulses, and normal sensory exam. Left Foot: dystrophic yellowed discolored nail plates with subungual debris Right Foot: dystrophic yellowed discolored nail plates with subungual debris and right hallux nail has been removed appears to be healing well no signs of infection or ulceration   Assessment:   1. Pain due to onychomycosis of toenails of both feet      Plan:  Patient was evaluated and treated and all questions answered.  Expect hallux nail to heal uneventfully nearly fully healed at this point  Discussed the etiology and treatment options for the condition in detail with the patient. Educated patient on the topical and oral treatment options for mycotic nails. Recommended debridement of the nails today. Sharp and mechanical debridement performed of all painful and mycotic nails today. Nails debrided in length and thickness using a nail nipper to level of comfort. Discussed treatment options including appropriate shoe gear. Follow up as needed for painful nails.    Return in about 3 months (around 08/13/2022) for painful fungal nails .

## 2022-05-17 ENCOUNTER — Ambulatory Visit: Payer: Medicare Other | Admitting: Family Medicine

## 2022-05-27 DIAGNOSIS — H353211 Exudative age-related macular degeneration, right eye, with active choroidal neovascularization: Secondary | ICD-10-CM | POA: Diagnosis not present

## 2022-05-27 DIAGNOSIS — F4320 Adjustment disorder, unspecified: Secondary | ICD-10-CM | POA: Diagnosis not present

## 2022-05-27 DIAGNOSIS — E782 Mixed hyperlipidemia: Secondary | ICD-10-CM | POA: Diagnosis not present

## 2022-05-27 DIAGNOSIS — H353122 Nonexudative age-related macular degeneration, left eye, intermediate dry stage: Secondary | ICD-10-CM | POA: Diagnosis not present

## 2022-05-27 DIAGNOSIS — H35372 Puckering of macula, left eye: Secondary | ICD-10-CM | POA: Diagnosis not present

## 2022-05-27 DIAGNOSIS — F339 Major depressive disorder, recurrent, unspecified: Secondary | ICD-10-CM | POA: Diagnosis not present

## 2022-05-27 DIAGNOSIS — E559 Vitamin D deficiency, unspecified: Secondary | ICD-10-CM | POA: Diagnosis not present

## 2022-05-27 DIAGNOSIS — H43813 Vitreous degeneration, bilateral: Secondary | ICD-10-CM | POA: Diagnosis not present

## 2022-05-27 DIAGNOSIS — I10 Essential (primary) hypertension: Secondary | ICD-10-CM | POA: Diagnosis not present

## 2022-05-29 DIAGNOSIS — F339 Major depressive disorder, recurrent, unspecified: Secondary | ICD-10-CM | POA: Diagnosis not present

## 2022-05-29 DIAGNOSIS — F329 Major depressive disorder, single episode, unspecified: Secondary | ICD-10-CM | POA: Diagnosis not present

## 2022-05-31 ENCOUNTER — Ambulatory Visit: Payer: Medicare Other | Admitting: Family Medicine

## 2022-05-31 NOTE — Progress Notes (Deleted)
OFFICE VISIT  05/31/2022  CC: No chief complaint on file.  Patient is a 88 y.o. male who presents for 74-monthfollow-up hypertension, chronic lower extremity venous insufficiency edema, history of iron deficiency anemia and chronic GI bleed, history of CVA. A/P as of last visit: "#1 hypertension, not ideal control but will hold off on any change in therapy at this time. He expresses strong desire to not get on any further medications. We will try to get some BP and heart rate numbers from his nursing home staff, especially in light of his bradycardia today. BMET, Mag, cbc.   2) History of CVA, with slight dysarthria and left lower facial weakness as residual. He is finished with PT/OT he gets ongoing PT/OT. Encouraged him to use a cane everywhere he goes.   3) Chronic lower extremity edema, venous insufficiency. Stable.  He knows to try to restrict sodium, elevate as needed.   #4 history of iron deficiency anemia, chronic GI bleed. As per past medical history section: Monitor CBC, no invasive evaluation CBC today."  INTERIM HX: All labs were excellent last visit. ***  Past Medical History:  Diagnosis Date   Ataxia 2014/15   brain MRI 02/2014 showed encephalomalacia parietal and occip lobes c/w old infarcts, plus diffuse cerebral atrophy, o/w normal   Cataract    Chronic renal insufficiency, stage 2 (mild) 12/2016   GFR 60s-70s   [place]   R MCA territory. No obstruction/stenosis on CTA head and neck.  No a-fib in hosp.  Echo with low-normal LV fxn.  Outpt rhythm monitoring planned.  ASA + plavix x 3wks, then ASA alone.   DDD (degenerative disc disease)    L-spine   Dermatitis    ?Contact derm? per WDouglass RiversDerm MD: Dr. SThreasa Alpha(05/09/16): punch bx 06/18/16: chronic eczematous derm/contact derm, neg fungal stain.  Allergy (RAST) testing NEG.   GERD (gastroesophageal reflux disease)    Heme positive stool 02/2012   colonoscopy normal.  EGD  showed small hiatal hernia with associated Cameron's erosions.  Hemoccults NEG x 3 09/2014. Hem + iron def 07/2019->conservative mgmt.   Hyperlipidemia    Hypertension    Insomnia    poor sleep hygiene.  Clonidine for his HTN has helped a little.   Iron deficiency anemia 02/2012; 07/2019   2013 Mild iron deficiency->small upper GI erosions, colonoscopy normal.  07/2019->Hb 7.5, heme+, GI eval->pt chose no endoscopy->IV iron and Hb monitoring, PO iron indefinitely: Hb/iron normal on recheck 10/2019 and 12/2019.   Macular degeneration, age related    exudative R eye; nonexudative L eye.  Vitreous degeneration bilat.   Obesity (BMI 30-39.9)    Osteoarthritis, multiple sites    Retinal hemorrhage 2016   w/ retinal detachment (right eye) : anticoagulants stopped due to this   Venous insufficiency of both lower extremities     Past Surgical History:  Procedure Laterality Date   APPENDECTOMY  09/30/1933   cataract surg     Bilateral/ 08-13   COLONOSCOPY  05/12/2012   for iron def anemia--NORMAL RESULT   ESOPHAGOGASTRODUODENOSCOPY  06/17/2012   Small hiatal hernia with associated Cameron's erosions--likely source of his mild iron def anemia.   NASAL SINUS SURGERY     1998   TONSILLECTOMY  09/30/1933   TRANSTHORACIC ECHOCARDIOGRAM  08/11/2021   2022->(in setting of acute CVA) EF 50-55%, no thrombus, WMA c/w ischemic CM vs stress-induced CM.  Valves ok.    Outpatient Medications Prior to Visit  Medication Sig  Dispense Refill   Aflibercept (EYLEA IO) Inject into the eye. One injection into right eye every 10 weeks     aspirin EC 81 MG EC tablet Take 1 tablet (81 mg total) by mouth daily. Swallow whole. 30 tablet 11   atorvastatin (LIPITOR) 40 MG tablet Take 1 tablet (40 mg total) by mouth daily.     Cholecalciferol (VITAMIN D3) 2000 UNITS TABS Take 1 tablet by mouth daily.     cloNIDine (CATAPRES) 0.1 MG tablet Take 1 tablet (0.1 mg total) by mouth 2 (two) times daily. 180 tablet 3    hydrochlorothiazide (HYDRODIURIL) 50 MG tablet Take 1 tablet (50 mg total) by mouth daily. 90 tablet 3   Multiple Vitamins-Minerals (PRESERVISION AREDS 2 PO) Take 1 tablet by mouth daily.     omeprazole (PRILOSEC) 40 MG capsule Take 1 capsule (40 mg total) by mouth daily. TAKE 1 CAPSULE BY MOUTH EVERY DAY 90 capsule 3   valsartan (DIOVAN) 320 MG tablet Take 1 tablet (320 mg total) by mouth daily. 90 tablet 3   No facility-administered medications prior to visit.    No Known Allergies  ROS As per HPI  PE:    05/01/2022    4:58 PM 05/01/2022    2:43 PM 05/01/2022    2:41 PM  Vitals with BMI  Height   '5\' 10"'$   Weight   185 lbs  BMI   26.71  Systolic 245 809   Diastolic 93 72   Pulse 61 61      Physical Exam  ***  LABS:  Last CBC Lab Results  Component Value Date   WBC 6.2 02/15/2022   HGB 12.8 (L) 02/15/2022   HCT 37.8 (L) 02/15/2022   MCV 95.6 02/15/2022   MCH 32.6 08/15/2021   RDW 14.9 02/15/2022   PLT 324.0 02/15/2022   Lab Results  Component Value Date   IRON 104 07/26/2021   TIBC 318 07/26/2021   FERRITIN 64 98/33/8250   Last metabolic panel Lab Results  Component Value Date   GLUCOSE 104 (H) 02/15/2022   NA 138 02/15/2022   K 4.0 02/15/2022   CL 99 02/15/2022   CO2 28 02/15/2022   BUN 19 02/15/2022   CREATININE 0.91 02/15/2022   GFRNONAA >60 08/15/2021   CALCIUM 9.1 02/15/2022   PROT 5.7 (L) 11/09/2021   ALBUMIN 3.7 11/09/2021   BILITOT 0.5 11/09/2021   ALKPHOS 86 11/09/2021   AST 14 11/09/2021   ALT 11 11/09/2021   ANIONGAP 7 08/15/2021   Last lipids Lab Results  Component Value Date   CHOL 243 (H) 08/11/2021   HDL 68 08/11/2021   LDLCALC 159 (H) 08/11/2021   LDLDIRECT 112.5 09/11/2011   TRIG 81 08/11/2021   CHOLHDL 3.6 08/11/2021   Last hemoglobin A1c Lab Results  Component Value Date   HGBA1C 5.8 (H) 08/11/2021   IMPRESSION AND PLAN:  No problem-specific Assessment & Plan notes found for this encounter.   An After Visit Summary  was printed and given to the patient.  FOLLOW UP: No follow-ups on file.  Signed:  Crissie Sickles, MD           05/31/2022

## 2022-06-05 ENCOUNTER — Encounter: Payer: Self-pay | Admitting: Family Medicine

## 2022-06-05 ENCOUNTER — Ambulatory Visit (INDEPENDENT_AMBULATORY_CARE_PROVIDER_SITE_OTHER): Payer: Medicare Other | Admitting: Family Medicine

## 2022-06-05 ENCOUNTER — Ambulatory Visit: Payer: Medicare Other | Admitting: Family Medicine

## 2022-06-05 VITALS — BP 139/63 | HR 62 | Temp 98.4°F | Ht 70.0 in | Wt 196.2 lb

## 2022-06-05 DIAGNOSIS — I10 Essential (primary) hypertension: Secondary | ICD-10-CM

## 2022-06-05 DIAGNOSIS — I639 Cerebral infarction, unspecified: Secondary | ICD-10-CM

## 2022-06-05 DIAGNOSIS — Z862 Personal history of diseases of the blood and blood-forming organs and certain disorders involving the immune mechanism: Secondary | ICD-10-CM

## 2022-06-05 DIAGNOSIS — Z23 Encounter for immunization: Secondary | ICD-10-CM | POA: Diagnosis not present

## 2022-06-05 LAB — BASIC METABOLIC PANEL
BUN: 25 mg/dL — ABNORMAL HIGH (ref 6–23)
CO2: 30 mEq/L (ref 19–32)
Calcium: 9.1 mg/dL (ref 8.4–10.5)
Chloride: 102 mEq/L (ref 96–112)
Creatinine, Ser: 0.92 mg/dL (ref 0.40–1.50)
GFR: 72.04 mL/min (ref >60.00)
Glucose, Bld: 100 mg/dL — ABNORMAL HIGH (ref 70–99)
Potassium: 3.5 mEq/L (ref 3.5–5.1)
Sodium: 140 mEq/L (ref 135–145)

## 2022-06-05 LAB — CBC
HCT: 37.1 % — ABNORMAL LOW (ref 39.0–52.0)
Hemoglobin: 12.6 g/dL — ABNORMAL LOW (ref 13.0–17.0)
MCHC: 33.9 g/dL (ref 30.0–36.0)
MCV: 97.2 fl (ref 78.0–100.0)
Platelets: 311 10*3/uL (ref 150.0–400.0)
RBC: 3.82 Mil/uL — ABNORMAL LOW (ref 4.22–5.81)
RDW: 14.9 % (ref 11.5–15.5)
WBC: 6.1 10*3/uL (ref 4.0–10.5)

## 2022-06-05 NOTE — Progress Notes (Signed)
OFFICE VISIT  06/05/2022  CC: f/u htn, etc  Patient is a 88 y.o. male who presents for 3 mo f/u HTN, LE edema, gait instability, hx of CVA with residual deficit, hx of IDA/chronic GI bleed A/P as of last visit: "#1 hypertension, not ideal control but will hold off on any change in therapy at this time. He expresses strong desire to not get on any further medications. We will try to get some BP and heart rate numbers from his nursing home staff, especially in light of his bradycardia today. BMET, Mag, cbc.   2) History of CVA, with slight dysarthria and left lower facial weakness as residual. He is finished with PT/OT he gets ongoing PT/OT. Encouraged him to use a cane everywhere he goes.   3) Chronic lower extremity edema, venous insufficiency. Stable.  He knows to try to restrict sodium, elevate as needed.   #4 history of iron deficiency anemia, chronic GI bleed. As per past medical history section: Monitor CBC, no invasive evaluation CBC today."  INTERIM HX: Ulcer says he is doing well. Says lower extremity swellin is improved lately. Lives in a nursing home and he no longer eats out at restaurants a lot. He says they check his blood pressure every day and usually do not tell him anything about it. No melena or hematochezia. He is not taking aspirin because he did not know if I wanted him to or not.  ROS as above, plus--> no fevers, no CP, no SOB, no wheezing, no cough, no dizziness, no HAs, no rashes, no melena/hematochezia.  No polyuria or polydipsia.  No myalgias or arthralgias.  No focal weakness, paresthesias, or tremors.  No acute vision or hearing abnormalities.  No dysuria or unusual/new urinary urgency or frequency.  No recent changes in lower legs. No n/v/d or abd pain.  No palpitations.     Past Medical History:  Diagnosis Date   Ataxia 2014/15   brain MRI 02/2014 showed encephalomalacia parietal and occip lobes c/w old infarcts, plus diffuse cerebral atrophy, o/w  normal   Cataract    Chronic renal insufficiency, stage 2 (mild) 12/2016   GFR 60s-70s   CVA (cerebral vascular accident) ([place]) 07/2021   R MCA territory. No obstruction/stenosis on CTA head and neck.  No a-fib in hosp.  Echo with low-normal LV fxn.  Outpt rhythm monitoring planned.  ASA + plavix x 3wks, then ASA alone.   DDD (degenerative disc disease)    L-spine   Dermatitis    ?Contact derm? per Douglass Rivers Derm MD: Dr. Threasa Alpha (05/09/16): punch bx 06/18/16: chronic eczematous derm/contact derm, neg fungal stain.  Allergy (RAST) testing NEG.   GERD (gastroesophageal reflux disease)    Heme positive stool 02/2012   colonoscopy normal.  EGD showed small hiatal hernia with associated Cameron's erosions.  Hemoccults NEG x 3 09/2014. Hem + iron def 07/2019->conservative mgmt.   Hyperlipidemia    Hypertension    Insomnia    poor sleep hygiene.  Clonidine for his HTN has helped a little.   Iron deficiency anemia 02/2012; 07/2019   2013 Mild iron deficiency->small upper GI erosions, colonoscopy normal.  07/2019->Hb 7.5, heme+, GI eval->pt chose no endoscopy->IV iron and Hb monitoring, PO iron indefinitely: Hb/iron normal on recheck 10/2019 and 12/2019.   Macular degeneration, age related    exudative R eye; nonexudative L eye.  Vitreous degeneration bilat.   Obesity (BMI 30-39.9)    Osteoarthritis, multiple sites    Retinal hemorrhage 2016   w/  retinal detachment (right eye) : anticoagulants stopped due to this   Venous insufficiency of both lower extremities     Past Surgical History:  Procedure Laterality Date   APPENDECTOMY  09/30/1933   cataract surg     Bilateral/ 08-13   COLONOSCOPY  05/12/2012   for iron def anemia--NORMAL RESULT   ESOPHAGOGASTRODUODENOSCOPY  06/17/2012   Small hiatal hernia with associated Cameron's erosions--likely source of his mild iron def anemia.   NASAL SINUS SURGERY     1998   TONSILLECTOMY  09/30/1933   TRANSTHORACIC ECHOCARDIOGRAM  08/11/2021    2022->(in setting of acute CVA) EF 50-55%, no thrombus, WMA c/w ischemic CM vs stress-induced CM.  Valves ok.    Outpatient Medications Prior to Visit  Medication Sig Dispense Refill   Aflibercept (EYLEA IO) Inject into the eye. One injection into right eye every 10 weeks     atorvastatin (LIPITOR) 40 MG tablet Take 1 tablet (40 mg total) by mouth daily.     Cholecalciferol (VITAMIN D3) 2000 UNITS TABS Take 1 tablet by mouth daily.     cloNIDine (CATAPRES) 0.1 MG tablet Take 1 tablet (0.1 mg total) by mouth 2 (two) times daily. 180 tablet 3   hydrochlorothiazide (HYDRODIURIL) 50 MG tablet Take 1 tablet (50 mg total) by mouth daily. 90 tablet 3   Multiple Vitamins-Minerals (PRESERVISION AREDS 2 PO) Take 1 tablet by mouth daily.     omeprazole (PRILOSEC) 40 MG capsule Take 1 capsule (40 mg total) by mouth daily. TAKE 1 CAPSULE BY MOUTH EVERY DAY 90 capsule 3   valsartan (DIOVAN) 320 MG tablet Take 1 tablet (320 mg total) by mouth daily. 90 tablet 3   aspirin EC 81 MG EC tablet Take 1 tablet (81 mg total) by mouth daily. Swallow whole. (Patient not taking: Reported on 06/05/2022) 30 tablet 11   No facility-administered medications prior to visit.    No Known Allergies  ROS As per HPI  PE:    06/05/2022    1:19 PM 05/01/2022    4:58 PM 05/01/2022    2:43 PM  Vitals with BMI  Height '5\' 10"'$     Weight 196 lbs 3 oz    BMI 16.10    Systolic 960 454 098  Diastolic 63 93 72  Pulse 62 61 61   Physical Exam  Gen: Alert, well appearing.  Patient is oriented to person, place, time, and situation. AFFECT: pleasant, lucid thought and speech. CV: RRR, no m/r/g.   LUNGS: CTA bilat, nonlabored resps, good aeration in all lung fields. EXT: 2+ bilateral lower extremity pitting edema. no erythema.  LABS:  Last CBC Lab Results  Component Value Date   WBC 6.2 02/15/2022   HGB 12.8 (L) 02/15/2022   HCT 37.8 (L) 02/15/2022   MCV 95.6 02/15/2022   MCH 32.6 08/15/2021   RDW 14.9 02/15/2022   PLT  324.0 02/15/2022   Lab Results  Component Value Date   IRON 104 07/26/2021   TIBC 318 07/26/2021   FERRITIN 64 11/91/4782   Last metabolic panel Lab Results  Component Value Date   GLUCOSE 104 (H) 02/15/2022   NA 138 02/15/2022   K 4.0 02/15/2022   CL 99 02/15/2022   CO2 28 02/15/2022   BUN 19 02/15/2022   CREATININE 0.91 02/15/2022   GFRNONAA >60 08/15/2021   CALCIUM 9.1 02/15/2022   PROT 5.7 (L) 11/09/2021   ALBUMIN 3.7 11/09/2021   BILITOT 0.5 11/09/2021   ALKPHOS 86 11/09/2021   AST  14 11/09/2021   ALT 11 11/09/2021   ANIONGAP 7 08/15/2021   Last lipids Lab Results  Component Value Date   CHOL 243 (H) 08/11/2021   HDL 68 08/11/2021   LDLCALC 159 (H) 08/11/2021   LDLDIRECT 112.5 09/11/2011   TRIG 81 08/11/2021   CHOLHDL 3.6 08/11/2021   Last hemoglobin A1c Lab Results  Component Value Date   HGBA1C 5.8 (H) 08/11/2021   Last thyroid functions Lab Results  Component Value Date   TSH 3.260 08/11/2021   Last vitamin B12 and Folate Lab Results  Component Value Date   VITAMINB12 351 10/13/2019   IMPRESSION AND PLAN:  #1 hypertension, not ideal control but will hold off on any change in therapy at this time. He expresses strong desire to not get on any further medications.  Continue HCTZ 50 mg a day, valsartan 320 mg a day, and clonidine 0.1 mg twice daily. BMET today.  #2 History of CVA, with slight dysarthria and left lower facial weakness as residual. I confirmed with him today that I DO want him to take 81 mg aspirin daily.  Continue atorvastatin 40 mg a day as well.   #3 Chronic lower extremity edema, venous insufficiency. Stable.  He knows to try to restrict sodium, elevate as needed.   #4 history of iron deficiency anemia, chronic GI bleed. As per past medical history section: Monitor CBC, no invasive evaluation CBC today  An After Visit Summary was printed and given to the patient.  FOLLOW UP: Return in about 3 months (around 09/04/2022) for  routine chronic illness f/u.  Signed:  Crissie Sickles, MD           06/05/2022

## 2022-06-05 NOTE — Patient Instructions (Signed)
Take your aspirin '81mg'$  tab every day.

## 2022-06-07 DIAGNOSIS — F5101 Primary insomnia: Secondary | ICD-10-CM | POA: Diagnosis not present

## 2022-06-10 ENCOUNTER — Telehealth: Payer: Self-pay

## 2022-06-10 ENCOUNTER — Ambulatory Visit (INDEPENDENT_AMBULATORY_CARE_PROVIDER_SITE_OTHER): Payer: Medicare Other

## 2022-06-10 DIAGNOSIS — F4320 Adjustment disorder, unspecified: Secondary | ICD-10-CM | POA: Diagnosis not present

## 2022-06-10 DIAGNOSIS — Z0000 Encounter for general adult medical examination without abnormal findings: Secondary | ICD-10-CM

## 2022-06-10 DIAGNOSIS — F339 Major depressive disorder, recurrent, unspecified: Secondary | ICD-10-CM | POA: Diagnosis not present

## 2022-06-10 NOTE — Telephone Encounter (Signed)
Called to ask about scheduling for next year.

## 2022-06-10 NOTE — Progress Notes (Signed)
Subjective:   Glen Barnett is a 88 y.o. male who presents for Medicare Annual/Subsequent preventive examination.  I connected with  Glen Barnett on 06/10/22 by an audio only telemedicine application and verified that I am speaking with the correct person using two identifiers.   I discussed the limitations, risks, security and privacy concerns of performing an evaluation and management service by telephone and the availability of in person appointments. I also discussed with the patient that there may be a patient responsible charge related to this service. The patient expressed understanding and verbally consented to this telephonic visit.  Location of Patient: home Location of Provider: office  List any persons and their role that are participating in the visit with the patient.   El [place], CMA  Review of Systems    Defer to PCP Cardiac Risk Factors include: hypertension;advanced age (>43mn, >>76women);male gender     Objective:    There were no vitals filed for this visit. There is no height or weight on file to calculate BMI.     05/01/2022    2:41 PM 06/29/2021    6:34 PM 01/30/2018   11:27 AM 01/07/2017   12:36 PM  Advanced Directives  Does Patient Have a Medical Advance Directive? Yes Yes Yes Yes  Type of AScientist, physiologicalof [place] will Living will;Healthcare Power of Attorney  Does patient want to make changes to medical advance directive? No - Patient declined No - Patient declined    Copy of [place]?   Yes No - copy requested    Current Medications (verified) Outpatient Encounter Medications as of 06/10/2022  Medication Sig   Aflibercept (EYLEA IO) Inject into the eye. One injection into right eye every 10 weeks   aspirin EC 81 MG EC tablet Take 1 tablet (81 mg total) by mouth daily. Swallow whole. (Patient not taking: Reported on 06/05/2022)   atorvastatin  (LIPITOR) 40 MG tablet Take 1 tablet (40 mg total) by mouth daily.   Cholecalciferol (VITAMIN D3) 2000 UNITS TABS Take 1 tablet by mouth daily.   cloNIDine (CATAPRES) 0.1 MG tablet Take 1 tablet (0.1 mg total) by mouth 2 (two) times daily.   hydrochlorothiazide (HYDRODIURIL) 50 MG tablet Take 1 tablet (50 mg total) by mouth daily.   Multiple Vitamins-Minerals (PRESERVISION AREDS 2 PO) Take 1 tablet by mouth daily.   omeprazole (PRILOSEC) 40 MG capsule Take 1 capsule (40 mg total) by mouth daily. TAKE 1 CAPSULE BY MOUTH EVERY DAY   valsartan (DIOVAN) 320 MG tablet Take 1 tablet (320 mg total) by mouth daily.   No facility-administered encounter medications on file as of 06/10/2022.    Allergies (verified) Patient has no known allergies.   History: Past Medical History:  Diagnosis Date   Ataxia 2014/15   brain MRI 02/2014 showed encephalomalacia parietal and occip lobes c/w old infarcts, plus diffuse cerebral atrophy, o/w normal   Cataract    Chronic renal insufficiency, stage 2 (mild) 12/2016   GFR 60s-70s   [place]   R MCA territory. No obstruction/stenosis on CTA head and neck.  No a-fib in hosp.  Echo with low-normal LV fxn.  Outpt rhythm monitoring planned.  ASA + plavix x 3wks, then ASA alone.   DDD (degenerative disc disease)    L-spine   Dermatitis    ?Contact derm? per WKonrad SahaMD: Dr. SThreasa Alpha(05/09/16): punch  bx 06/18/16: chronic eczematous derm/contact derm, neg fungal stain.  Allergy (RAST) testing NEG.   GERD (gastroesophageal reflux disease)    Heme positive stool 02/2012   colonoscopy normal.  EGD showed small hiatal hernia with associated Cameron's erosions.  Hemoccults NEG x 3 09/2014. Hem + iron def 07/2019->conservative mgmt.   Hyperlipidemia    Hypertension    Insomnia    poor sleep hygiene.  Clonidine for his HTN has helped a little.   Iron deficiency anemia 02/2012; 07/2019   2013 Mild iron deficiency->small upper GI  erosions, colonoscopy normal.  07/2019->Hb 7.5, heme+, GI eval->pt chose no endoscopy->IV iron and Hb monitoring, PO iron indefinitely: Hb/iron normal on recheck 10/2019 and 12/2019.   Macular degeneration, age related    exudative R eye; nonexudative L eye.  Vitreous degeneration bilat.   Obesity (BMI 30-39.9)    Osteoarthritis, multiple sites    Retinal hemorrhage 2016   w/ retinal detachment (right eye) : anticoagulants stopped due to this   Venous insufficiency of both lower extremities    Past Surgical History:  Procedure Laterality Date   APPENDECTOMY  09/30/1933   cataract surg     Bilateral/ 08-13   COLONOSCOPY  05/12/2012   for iron def anemia--NORMAL RESULT   ESOPHAGOGASTRODUODENOSCOPY  06/17/2012   Small hiatal hernia with associated Cameron's erosions--likely source of his mild iron def anemia.   NASAL SINUS SURGERY     1998   TONSILLECTOMY  09/30/1933   TRANSTHORACIC ECHOCARDIOGRAM  08/11/2021   2022->(in setting of acute CVA) EF 50-55%, no thrombus, WMA c/w ischemic CM vs stress-induced CM.  Valves ok.   Family History  Problem Relation Age of Onset   Liver cancer Mother    Social History   Socioeconomic History   Marital status: Widowed    Spouse name: Not on file   Number of children: Not on file   Years of education: Not on file   Highest education level: Not on file  Occupational History   Not on file  Tobacco Use   Smoking status: Never   Smokeless tobacco: Never  Vaping Use   Vaping Use: Never used  Substance and Sexual Activity   Alcohol use: No   Drug use: No   Sexual activity: Not Currently  Other Topics Concern   Not on file  Social History Narrative   Widower.  Wife died 2011/08/27 of brain cancer.   He relocated from [place] to live near son.   NO exercise.  No T/A/Ds.   Retired Chief Financial Officer.   Social Determinants of Health   Financial Resource Strain: Low Risk  (06/10/2022)   Overall Financial Resource Strain (CARDIA)    Difficulty of  Paying Living Expenses: Not hard at all  Food Insecurity: No Food Insecurity (06/10/2022)   Hunger Vital Sign    Worried About Running Out of Food in the Last Year: Never true    Ran Out of Food in the Last Year: Never true  Transportation Needs: No Transportation Needs (06/10/2022)   PRAPARE - Hydrologist (Medical): No    Lack of Transportation (Non-Medical): No  Physical Activity: Inactive (06/10/2022)   Exercise Vital Sign    Days of Exercise per Week: 0 days    Minutes of Exercise per Session: 0 min  Stress: No Stress Concern Present (06/10/2022)   [place]    Feeling of Stress : Not at all  Social Connections: Socially  Isolated (06/10/2022)   Social Connection and Isolation Panel [NHANES]    Frequency of Communication with Friends and Family: Never    Frequency of Social Gatherings with Friends and Family: Never    Attends Religious Services: Never    Marine scientist or Organizations: No    Attends Archivist Meetings: Never    Marital Status: Widowed    Tobacco Counseling Counseling given: Not Answered   Clinical Intake:  Pre-visit preparation completed: No  Pain : No/denies pain     Nutritional Risks: None Diabetes: No  How often do you need to have someone help you when you read instructions, pamphlets, or other written materials from your doctor or pharmacy?: 1 - Never  Diabetic?no  Interpreter Needed?: No      Activities of Daily Living    06/10/2022    1:41 PM 06/29/2021    6:36 PM  In your present state of health, do you have any difficulty performing the following activities:  Hearing? 0 1  Comment  can't hear well  Vision? 0 1  Comment  Reading glasses  Difficulty concentrating or making decisions? 0 1  Walking or climbing stairs? 0 1  Dressing or bathing? 0 0  Doing errands, shopping? 0 0  Preparing Food and eating ? N N  Using the  Toilet? N N  In the past six months, have you accidently leaked urine? N N  Do you have problems with loss of bowel control? N N  Managing your Medications? N N  Managing your Finances? N N  Housekeeping or managing your Housekeeping? Aggie Moats  Comment  Stays with son    Patient Care Team: Tammi Sou, MD as PCP - General Milus Banister, MD as Consulting Physician (Gastroenterology) Sherlynn Stalls, MD as Consulting Physician (Ophthalmology) Pablo Lawrence as Consulting Physician (Allergy and Immunology) Murvin Donning, MD (Dermatology) Pill, Ronelle Nigh, MD (Orthopedic Surgery)  Indicate any recent Medical Services you may have received from other than [facility] providers in the past year (date may be approximate).     Assessment:   This is a routine wellness examination for Glen Barnett.  Hearing/Vision screen No results found.  Dietary issues and exercise activities discussed: Current Exercise Habits: The patient does not participate in regular exercise at present, Exercise limited by: None identified   Goals Addressed   None   Depression Screen    06/10/2022    1:49 PM 06/10/2022    1:41 PM 09/26/2021    1:54 PM 07/26/2021   11:05 AM 06/29/2021    6:29 PM 12/25/2020   10:37 AM 01/10/2020    1:25 PM  PHQ 2/9 Scores  PHQ - 2 Score 0 0 0 0 0 1 0    Fall Risk    06/10/2022    1:40 PM 07/26/2021   11:05 AM 06/29/2021    6:35 PM 12/25/2020   10:36 AM 08/20/2019   10:06 AM  Fall Risk   Falls in the past year? 1 0 0 0 0  Comment     Emmi Telephone Survey: data to providers prior to load  Number falls in past yr: 0 0 0 0   Injury with Fall? 0 0 0 1   Risk for fall due to : No Fall Risks  No Fall Risks    Follow up Falls evaluation completed Falls evaluation completed Falls evaluation completed Falls evaluation completed     [place]:  Any stairs  in or around the home? Yes  If so, are there any without handrails? Yes  Home free of  loose throw rugs in walkways, pet beds, electrical cords, etc? Yes  Adequate lighting in your home to reduce risk of falls? Yes   ASSISTIVE DEVICES UTILIZED TO PREVENT FALLS:  Life alert? No  Use of a cane, walker or w/c? Yes  Grab bars in the bathroom? Yes  Shower chair or bench in shower? Yes  Elevated toilet seat or a handicapped toilet? Yes   TIMED UP AND GO:  Was the test performed? No .  Length of time to ambulate 10 feet: n/a sec.     Cognitive Function:    01/30/2018   11:30 AM 01/07/2017   12:38 PM  MMSE - Mini Mental State Exam  Orientation to time 5 5  Orientation to Place 5 5  Registration 3 3  Attention/ Calculation 5 5  Recall 1 2  Language- name 2 objects 2 2  Language- repeat 1 1  Language- follow 3 step command 3 3  Language- read & follow direction 1 1  Write a sentence 1 0  Copy design 1 1  Total score 28 28        06/10/2022    1:50 PM 06/29/2021    6:40 PM  6CIT Screen  What Year? 0 points 0 points  What month? 0 points 0 points  What time? 0 points 3 points  Count back from 20 0 points 0 points  Months in reverse  4 points  Repeat phrase 0 points 0 points  Total Score  7 points    Immunizations Immunization History  Administered Date(s) Administered   Fluad Quad(high Dose 65+) 06/13/2019, 07/26/2021, 06/05/2022   Influenza Split 07/04/2011, 08/11/2012, 08/21/2014   Influenza, High Dose Seasonal PF 06/18/2016, 06/30/2017, 06/30/2018   Influenza,inj,Quad PF,6+ Mos 06/07/2013   Influenza-Unspecified 06/01/2015, 06/30/2020   Janssen (J&J) SARS-COV-2 Vaccination 01/27/2020   Pneumococcal Conjugate-13 09/08/2014   Pneumococcal Polysaccharide-23 09/30/1996   Td 10/01/2007   Zoster Recombinat (Shingrix) 06/13/2019, 08/16/2019   Zoster, Live 10/01/2007    TDAP status: Up to date  Flu Vaccine status: Due, Education has been provided regarding the importance of this vaccine. Advised may receive this vaccine at local pharmacy or Health Dept.  Aware to provide a copy of the vaccination record if obtained from local pharmacy or Health Dept. Verbalized acceptance and understanding.  Pneumococcal vaccine status: Up to date  Covid-19 vaccine status: Completed vaccines  Qualifies for Shingles Vaccine? Yes   Zostavax completed No   Shingrix Completed?: Yes  Screening Tests Health Maintenance  Topic Date Due   COVID-19 Vaccine (2 - Janssen risk series) 06/21/2022 (Originally 02/24/2020)   TETANUS/TDAP  07/26/2022 (Originally 09/30/2017)   Pneumonia Vaccine 74+ Years old  Completed   INFLUENZA VACCINE  Completed   Zoster Vaccines- Shingrix  Completed   HPV VACCINES  Aged Out    Health Maintenance  There are no preventive care reminders to display for this patient.  Colorectal cancer screening: No longer required.   Lung Cancer Screening: (Low Dose CT Chest recommended if Age 85-80 years, 30 pack-year currently smoking OR have quit w/in 15years.) does not qualify.   Lung Cancer Screening Referral: n/a  Additional Screening:  Hepatitis C Screening: does not qualify; Completed n/a  Vision Screening: Recommended annual ophthalmology exams for early detection of glaucoma and other disorders of the eye. Is the patient up to date with their annual eye exam?  Yes  Who is the provider or what is the name of the office in which the patient attends annual eye exams? N/A If pt is not established with a provider, would they like to be referred to a provider to establish care? No .   Dental Screening: Recommended annual dental exams for proper oral hygiene  Community Resource Referral / Chronic Care Management: CRR required this visit?  No   CCM required this visit?  No      Plan:     I have personally reviewed and noted the following in the patient's chart:   Medical and social history Use of alcohol, tobacco or illicit drugs  Current medications and supplements including opioid prescriptions. Patient is not currently  taking opioid prescriptions. Functional ability and status Nutritional status Physical activity Advanced directives List of other physicians Hospitalizations, surgeries, and ER visits in previous 12 months Vitals Screenings to include cognitive, depression, and falls Referrals and appointments  In addition, I have reviewed and discussed with patient certain preventive protocols, quality metrics, and best practice recommendations. A written personalized care plan for preventive services as well as general preventive health recommendations were provided to patient.     Beatrix Fetters, [place]   06/10/2022   Nurse Notes:   Glen Barnett , Thank you for taking time to come for your Medicare Wellness Visit. I appreciate your ongoing commitment to your health goals. Please review the following plan we discussed and let me know if I can assist you in the future.   These are the goals we discussed:  Goals       patient (pt-stated)      Maintain current health      Patient Stated      Maintain current health         This is a list of the screening recommended for you and due dates:  Health Maintenance  Topic Date Due   COVID-19 Vaccine (2 - Janssen risk series) 06/21/2022*   Tetanus Vaccine  07/26/2022*   Pneumonia Vaccine  Completed   Flu Shot  Completed   Zoster (Shingles) Vaccine  Completed   HPV Vaccine  Aged Out  *Topic was postponed. The date shown is not the original due date.

## 2022-06-10 NOTE — Patient Instructions (Signed)
Health Maintenance, Male Adopting a healthy lifestyle and getting preventive care are important in promoting health and wellness. Ask your health care provider about: The right schedule for you to have regular tests and exams. Things you can do on your own to prevent diseases and keep yourself healthy. What should I know about diet, weight, and exercise? Eat a healthy diet  Eat a diet that includes plenty of vegetables, fruits, low-fat dairy products, and lean protein. Do not eat a lot of foods that are high in solid fats, added sugars, or sodium. Maintain a healthy weight Body mass index (BMI) is a measurement that can be used to identify possible weight problems. It estimates body fat based on height and weight. Your health care provider can help determine your BMI and help you achieve or maintain a healthy weight. Get regular exercise Get regular exercise. This is one of the most important things you can do for your health. Most adults should: Exercise for at least 150 minutes each week. The exercise should increase your heart rate and make you sweat (moderate-intensity exercise). Do strengthening exercises at least twice a week. This is in addition to the moderate-intensity exercise. Spend less time sitting. Even light physical activity can be beneficial. Watch cholesterol and blood lipids Have your blood tested for lipids and cholesterol at 86 years of age, then have this test every 5 years. You may need to have your cholesterol levels checked more often if: Your lipid or cholesterol levels are high. You are older than 86 years of age. You are at high risk for heart disease. What should I know about cancer screening? Many types of cancers can be detected early and may often be prevented. Depending on your health history and family history, you may need to have cancer screening at various ages. This may include screening for: Colorectal cancer. Prostate cancer. Skin cancer. Lung  cancer. What should I know about heart disease, diabetes, and high blood pressure? Blood pressure and heart disease High blood pressure causes heart disease and increases the risk of stroke. This is more likely to develop in people who have high blood pressure readings or are overweight. Talk with your health care provider about your target blood pressure readings. Have your blood pressure checked: Every 3-5 years if you are 18-39 years of age. Every year if you are 40 years old or older. If you are between the ages of 65 and 75 and are a current or former smoker, ask your health care provider if you should have a one-time screening for abdominal aortic aneurysm (AAA). Diabetes Have regular diabetes screenings. This checks your fasting blood sugar level. Have the screening done: Once every three years after age 45 if you are at a normal weight and have a low risk for diabetes. More often and at a younger age if you are overweight or have a high risk for diabetes. What should I know about preventing infection? Hepatitis B If you have a higher risk for hepatitis B, you should be screened for this virus. Talk with your health care provider to find out if you are at risk for hepatitis B infection. Hepatitis C Blood testing is recommended for: Everyone born from 1945 through 1965. Anyone with known risk factors for hepatitis C. Sexually transmitted infections (STIs) You should be screened each year for STIs, including gonorrhea and chlamydia, if: You are sexually active and are younger than 86 years of age. You are older than 86 years of age and your   health care provider tells you that you are at risk for this type of infection. Your sexual activity has changed since you were last screened, and you are at increased risk for chlamydia or gonorrhea. Ask your health care provider if you are at risk. Ask your health care provider about whether you are at high risk for HIV. Your health care provider  may recommend a prescription medicine to help prevent HIV infection. If you choose to take medicine to prevent HIV, you should first get tested for HIV. You should then be tested every 3 months for as long as you are taking the medicine. Follow these instructions at home: Alcohol use Do not drink alcohol if your health care provider tells you not to drink. If you drink alcohol: Limit how much you have to 0-2 drinks a day. Know how much alcohol is in your drink. In the [place] one drink equals one 12 oz bottle of beer (355 mL), one 5 oz glass of wine (148 mL), or one 1 oz glass of hard liquor (44 mL). Lifestyle Do not use any products that contain nicotine or tobacco. These products include cigarettes, chewing tobacco, and vaping devices, such as e-cigarettes. If you need help quitting, ask your health care provider. Do not use street drugs. Do not share needles. Ask your health care provider for help if you need support or information about quitting drugs. General instructions Schedule regular health, dental, and eye exams. Stay current with your vaccines. Tell your health care provider if: You often feel depressed. You have ever been abused or do not feel safe at home. Summary Adopting a healthy lifestyle and getting preventive care are important in promoting health and wellness. Follow your health care provider's instructions about healthy diet, exercising, and getting tested or screened for diseases. Follow your health care provider's instructions on monitoring your cholesterol and blood pressure. This information is not intended to replace advice given to you by your health care provider. Make sure you discuss any questions you have with your health care provider. Document Revised: 02/05/2021 Document Reviewed: 02/05/2021 Elsevier Patient Education  2023 [employer]  

## 2022-06-11 DIAGNOSIS — E782 Mixed hyperlipidemia: Secondary | ICD-10-CM | POA: Diagnosis not present

## 2022-06-11 DIAGNOSIS — E559 Vitamin D deficiency, unspecified: Secondary | ICD-10-CM | POA: Diagnosis not present

## 2022-06-11 DIAGNOSIS — I10 Essential (primary) hypertension: Secondary | ICD-10-CM | POA: Diagnosis not present

## 2022-06-14 ENCOUNTER — Encounter: Payer: Self-pay | Admitting: Family Medicine

## 2022-06-24 DIAGNOSIS — M79604 Pain in right leg: Secondary | ICD-10-CM | POA: Diagnosis not present

## 2022-06-24 DIAGNOSIS — K219 Gastro-esophageal reflux disease without esophagitis: Secondary | ICD-10-CM | POA: Diagnosis not present

## 2022-06-24 DIAGNOSIS — F329 Major depressive disorder, single episode, unspecified: Secondary | ICD-10-CM | POA: Diagnosis not present

## 2022-06-24 DIAGNOSIS — I639 Cerebral infarction, unspecified: Secondary | ICD-10-CM | POA: Diagnosis not present

## 2022-06-24 DIAGNOSIS — D649 Anemia, unspecified: Secondary | ICD-10-CM | POA: Diagnosis not present

## 2022-06-24 DIAGNOSIS — R600 Localized edema: Secondary | ICD-10-CM | POA: Diagnosis not present

## 2022-06-24 DIAGNOSIS — F339 Major depressive disorder, recurrent, unspecified: Secondary | ICD-10-CM | POA: Diagnosis not present

## 2022-07-01 DIAGNOSIS — F4320 Adjustment disorder, unspecified: Secondary | ICD-10-CM | POA: Diagnosis not present

## 2022-07-01 DIAGNOSIS — F339 Major depressive disorder, recurrent, unspecified: Secondary | ICD-10-CM | POA: Diagnosis not present

## 2022-07-03 ENCOUNTER — Ambulatory Visit: Payer: Medicare Other

## 2022-07-04 DIAGNOSIS — F329 Major depressive disorder, single episode, unspecified: Secondary | ICD-10-CM | POA: Diagnosis not present

## 2022-07-04 DIAGNOSIS — F339 Major depressive disorder, recurrent, unspecified: Secondary | ICD-10-CM | POA: Diagnosis not present

## 2022-07-05 DIAGNOSIS — F5101 Primary insomnia: Secondary | ICD-10-CM | POA: Diagnosis not present

## 2022-07-22 DIAGNOSIS — F4320 Adjustment disorder, unspecified: Secondary | ICD-10-CM | POA: Diagnosis not present

## 2022-07-22 DIAGNOSIS — F339 Major depressive disorder, recurrent, unspecified: Secondary | ICD-10-CM | POA: Diagnosis not present

## 2022-07-25 DIAGNOSIS — D649 Anemia, unspecified: Secondary | ICD-10-CM | POA: Diagnosis not present

## 2022-07-25 DIAGNOSIS — I10 Essential (primary) hypertension: Secondary | ICD-10-CM | POA: Diagnosis not present

## 2022-07-25 DIAGNOSIS — M6281 Muscle weakness (generalized): Secondary | ICD-10-CM | POA: Diagnosis not present

## 2022-07-31 DIAGNOSIS — H43813 Vitreous degeneration, bilateral: Secondary | ICD-10-CM | POA: Diagnosis not present

## 2022-07-31 DIAGNOSIS — H353211 Exudative age-related macular degeneration, right eye, with active choroidal neovascularization: Secondary | ICD-10-CM | POA: Diagnosis not present

## 2022-07-31 DIAGNOSIS — H353122 Nonexudative age-related macular degeneration, left eye, intermediate dry stage: Secondary | ICD-10-CM | POA: Diagnosis not present

## 2022-07-31 DIAGNOSIS — H35372 Puckering of macula, left eye: Secondary | ICD-10-CM | POA: Diagnosis not present

## 2022-08-01 DIAGNOSIS — I10 Essential (primary) hypertension: Secondary | ICD-10-CM | POA: Diagnosis not present

## 2022-08-02 DIAGNOSIS — F5101 Primary insomnia: Secondary | ICD-10-CM | POA: Diagnosis not present

## 2022-08-05 DIAGNOSIS — F4320 Adjustment disorder, unspecified: Secondary | ICD-10-CM | POA: Diagnosis not present

## 2022-08-05 DIAGNOSIS — F339 Major depressive disorder, recurrent, unspecified: Secondary | ICD-10-CM | POA: Diagnosis not present

## 2022-08-06 DIAGNOSIS — M2141 Flat foot [pes planus] (acquired), right foot: Secondary | ICD-10-CM | POA: Diagnosis not present

## 2022-08-06 DIAGNOSIS — M79675 Pain in left toe(s): Secondary | ICD-10-CM | POA: Diagnosis not present

## 2022-08-06 DIAGNOSIS — B351 Tinea unguium: Secondary | ICD-10-CM | POA: Diagnosis not present

## 2022-08-06 DIAGNOSIS — I739 Peripheral vascular disease, unspecified: Secondary | ICD-10-CM | POA: Diagnosis not present

## 2022-08-14 ENCOUNTER — Ambulatory Visit: Payer: Medicare Other | Admitting: Podiatry

## 2022-08-19 DIAGNOSIS — F4320 Adjustment disorder, unspecified: Secondary | ICD-10-CM | POA: Diagnosis not present

## 2022-08-19 DIAGNOSIS — F339 Major depressive disorder, recurrent, unspecified: Secondary | ICD-10-CM | POA: Diagnosis not present

## 2022-08-26 DIAGNOSIS — E782 Mixed hyperlipidemia: Secondary | ICD-10-CM | POA: Diagnosis not present

## 2022-08-26 DIAGNOSIS — F5101 Primary insomnia: Secondary | ICD-10-CM | POA: Diagnosis not present

## 2022-08-26 DIAGNOSIS — Z8673 Personal history of transient ischemic attack (TIA), and cerebral infarction without residual deficits: Secondary | ICD-10-CM | POA: Diagnosis not present

## 2022-09-02 DIAGNOSIS — F339 Major depressive disorder, recurrent, unspecified: Secondary | ICD-10-CM | POA: Diagnosis not present

## 2022-09-02 DIAGNOSIS — F4320 Adjustment disorder, unspecified: Secondary | ICD-10-CM | POA: Diagnosis not present

## 2022-09-04 ENCOUNTER — Ambulatory Visit: Payer: Medicare Other | Admitting: Family Medicine

## 2022-09-06 DIAGNOSIS — F5101 Primary insomnia: Secondary | ICD-10-CM | POA: Diagnosis not present

## 2022-09-16 ENCOUNTER — Ambulatory Visit (INDEPENDENT_AMBULATORY_CARE_PROVIDER_SITE_OTHER): Payer: Medicare Other | Admitting: Family Medicine

## 2022-09-16 ENCOUNTER — Encounter: Payer: Self-pay | Admitting: Family Medicine

## 2022-09-16 VITALS — BP 130/70 | HR 57 | Ht 70.0 in | Wt 199.0 lb

## 2022-09-16 DIAGNOSIS — Z862 Personal history of diseases of the blood and blood-forming organs and certain disorders involving the immune mechanism: Secondary | ICD-10-CM

## 2022-09-16 DIAGNOSIS — I639 Cerebral infarction, unspecified: Secondary | ICD-10-CM | POA: Diagnosis not present

## 2022-09-16 DIAGNOSIS — Z8673 Personal history of transient ischemic attack (TIA), and cerebral infarction without residual deficits: Secondary | ICD-10-CM | POA: Diagnosis not present

## 2022-09-16 DIAGNOSIS — F4320 Adjustment disorder, unspecified: Secondary | ICD-10-CM | POA: Diagnosis not present

## 2022-09-16 DIAGNOSIS — F339 Major depressive disorder, recurrent, unspecified: Secondary | ICD-10-CM | POA: Diagnosis not present

## 2022-09-16 DIAGNOSIS — I10 Essential (primary) hypertension: Secondary | ICD-10-CM

## 2022-09-16 DIAGNOSIS — R600 Localized edema: Secondary | ICD-10-CM | POA: Diagnosis not present

## 2022-09-16 DIAGNOSIS — E7800 Pure hypercholesterolemia, unspecified: Secondary | ICD-10-CM | POA: Diagnosis not present

## 2022-09-16 MED ORDER — HYDROCHLOROTHIAZIDE 50 MG PO TABS: 50.0000 mg | ORAL_TABLET | Freq: Every day | ORAL | 3 refills | Status: AC

## 2022-09-16 MED ORDER — ATORVASTATIN CALCIUM 40 MG PO TABS: 40.0000 mg | ORAL_TABLET | Freq: Every day | ORAL | 3 refills | Status: AC

## 2022-09-16 MED ORDER — VALSARTAN 320 MG PO TABS: 320.0000 mg | ORAL_TABLET | Freq: Every day | ORAL | 3 refills | Status: AC

## 2022-09-16 MED ORDER — OMEPRAZOLE 40 MG PO CPDR: 40.0000 mg | DELAYED_RELEASE_CAPSULE | Freq: Every day | ORAL | 3 refills | Status: AC

## 2022-09-16 MED ORDER — CLONIDINE HCL 0.1 MG PO TABS: 0.1000 mg | ORAL_TABLET | Freq: Two times a day (BID) | ORAL | 3 refills | Status: AC

## 2022-09-16 NOTE — Progress Notes (Signed)
OFFICE VISIT  09/16/2022  CC:  Chief Complaint  Patient presents with   Follow-up    3 month follow up     HPI:    Patient is a 88 y.o. male who presents unaccompanied for f/u HTN, hx CVA, LE edema, and hx of IDA. A/P as of last visit: "#1 hypertension, not ideal control but will hold off on any change in therapy at this time. He expresses strong desire to not get on any further medications.  Continue HCTZ 50 mg a day, valsartan 320 mg a day, and clonidine 0.1 mg twice daily. BMET today.   #2 History of CVA, with slight dysarthria and left lower facial weakness as residual. I confirmed with him today that I DO want him to take 81 mg aspirin daily.  Continue atorvastatin 40 mg a day as well.   #3 Chronic lower extremity edema, venous insufficiency. Stable.  He knows to try to restrict sodium, elevate as needed.   #4 history of iron deficiency anemia, chronic GI bleed. As per past medical history section: Monitor CBC, no invasive evaluation CBC today"  INTERIM HX: States he is feeling fine.  Still living in nursing home in [place]. Appetite good. He uses a cane for most of his ambulation.  Occasionally uses a walker.  No falls. Says lower extremity swelling goes up in the day and then back down at night, as per his usual. Denies bright red blood per rectum or melena stool. No focal weakness.  ROS as above, plus--> no fevers, no CP, no SOB, no wheezing, no cough, no dizziness, no HAs, no rashes.  No polyuria or polydipsia.  No myalgias or arthralgias.  No paresthesias or tremors.  No acute vision or hearing abnormalities.  No dysuria or unusual/new urinary urgency or frequency.  No n/v/d or abd pain.  No palpitations.    Past Medical History:  Diagnosis Date   Ataxia 2014/15   brain MRI 02/2014 showed encephalomalacia parietal and occip lobes c/w old infarcts, plus diffuse cerebral atrophy, o/w normal   Cataract    Chronic renal insufficiency, stage 2 (mild) 12/2016   GFR  60s-70s   CVA (cerebral vascular accident) ([place]) 07/2021   R MCA territory. No obstruction/stenosis on CTA head and neck.  No a-fib in hosp.  Echo with low-normal LV fxn.  Outpt rhythm monitoring planned.  ASA + plavix x 3wks, then ASA alone.   DDD (degenerative disc disease)    L-spine   Dermatitis    ?Contact derm? per [facility] MD: Dr. Threasa Alpha (05/09/16): punch bx 06/18/16: chronic eczematous derm/contact derm, neg fungal stain.  Allergy (RAST) testing NEG.   GERD (gastroesophageal reflux disease)    Heme positive stool 02/2012   colonoscopy normal.  EGD showed small hiatal hernia with associated Cameron's erosions.  Hemoccults NEG x 3 09/2014. Hem + iron def 07/2019->conservative mgmt.   Hyperlipidemia    Hypertension    Insomnia    poor sleep hygiene.  Clonidine for his HTN has helped a little.   Iron deficiency anemia 02/2012; 07/2019   2013 Mild iron deficiency->small upper GI erosions, colonoscopy normal.  07/2019->Hb 7.5, heme+, GI eval->pt chose no endoscopy->IV iron and Hb monitoring, PO iron indefinitely: Hb/iron normal on recheck 10/2019 and 12/2019.   Macular degeneration, age related    exudative R eye; nonexudative L eye.  Vitreous degeneration bilat.   Obesity (BMI 30-39.9)    Osteoarthritis, multiple sites    Retinal hemorrhage 2016   w/ retinal  detachment (right eye) : anticoagulants stopped due to this   Venous insufficiency of both lower extremities     Past Surgical History:  Procedure Laterality Date   APPENDECTOMY  09/30/1933   cataract surg     Bilateral/ 08-13   COLONOSCOPY  05/12/2012   for iron def anemia--NORMAL RESULT   ESOPHAGOGASTRODUODENOSCOPY  06/17/2012   Small hiatal hernia with associated Cameron's erosions--likely source of his mild iron def anemia.   NASAL SINUS SURGERY     1998   TONSILLECTOMY  09/30/1933   TRANSTHORACIC ECHOCARDIOGRAM  08/11/2021   2022->(in setting of acute CVA) EF 50-55%, no thrombus, WMA c/w ischemic CM vs  stress-induced CM.  Valves ok.    Outpatient Medications Prior to Visit  Medication Sig Dispense Refill   Aflibercept (EYLEA IO) Inject into the eye. One injection into right eye every 10 weeks     aspirin EC 81 MG tablet Take 81 mg by mouth daily.     Cholecalciferol (VITAMIN D3) 2000 UNITS TABS Take 1 tablet by mouth daily.     Multiple Vitamins-Minerals (PRESERVISION AREDS 2 PO) Take 1 tablet by mouth daily.     atorvastatin (LIPITOR) 40 MG tablet Take 1 tablet (40 mg total) by mouth daily.     cloNIDine (CATAPRES) 0.1 MG tablet Take 1 tablet (0.1 mg total) by mouth 2 (two) times daily. 180 tablet 3   hydrochlorothiazide (HYDRODIURIL) 50 MG tablet Take 1 tablet (50 mg total) by mouth daily. 90 tablet 3   omeprazole (PRILOSEC) 40 MG capsule Take 1 capsule (40 mg total) by mouth daily. TAKE 1 CAPSULE BY MOUTH EVERY DAY 90 capsule 3   valsartan (DIOVAN) 320 MG tablet Take 1 tablet (320 mg total) by mouth daily. 90 tablet 3   No facility-administered medications prior to visit.    No Known Allergies  ROS As per HPI  PE:    09/16/2022    2:24 PM 06/05/2022    1:19 PM 05/01/2022    4:58 PM  Vitals with BMI  Height '5\' 10"'$  '5\' 10"'$    Weight 199 lbs 196 lbs 3 oz   BMI 60.10 93.23   Systolic 557 322 025  Diastolic 70 63 93  Pulse 57 62 61     Physical Exam  Gen: Alert, well appearing.  Patient is oriented to person, place, time, and situation. AFFECT: pleasant, lucid thought and speech. CV: RRR, no m/r/g.   LUNGS: CTA bilat, nonlabored resps, good aeration in all lung fields. EXT: no clubbing or cyanosis.  1+  bilat LE pitting edema.    LABS:  Last CBC Lab Results  Component Value Date   WBC 6.1 06/05/2022   HGB 12.6 (L) 06/05/2022   HCT 37.1 (L) 06/05/2022   MCV 97.2 06/05/2022   MCH 32.6 08/15/2021   RDW 14.9 06/05/2022   PLT 311.0 06/05/2022   Lab Results  Component Value Date   IRON 104 07/26/2021   TIBC 318 07/26/2021   FERRITIN 64 42/70/6237   Last metabolic  panel Lab Results  Component Value Date   GLUCOSE 100 (H) 06/05/2022   NA 140 06/05/2022   K 3.5 06/05/2022   CL 102 06/05/2022   CO2 30 06/05/2022   BUN 25 (H) 06/05/2022   CREATININE 0.92 06/05/2022   GFRNONAA >60 08/15/2021   CALCIUM 9.1 06/05/2022   PROT 5.7 (L) 11/09/2021   ALBUMIN 3.7 11/09/2021   BILITOT 0.5 11/09/2021   ALKPHOS 86 11/09/2021   AST 14 11/09/2021  ALT 11 11/09/2021   ANIONGAP 7 08/15/2021   Last lipids Lab Results  Component Value Date   CHOL 243 (H) 08/11/2021   HDL 68 08/11/2021   LDLCALC 159 (H) 08/11/2021   LDLDIRECT 112.5 09/11/2011   TRIG 81 08/11/2021   CHOLHDL 3.6 08/11/2021   Last hemoglobin A1c Lab Results  Component Value Date   HGBA1C 5.8 (H) 08/11/2021   Last thyroid functions Lab Results  Component Value Date   TSH 3.260 08/11/2021   IMPRESSION AND PLAN:  #1 hypertension, well-controlled on clonidine 0.1 mg twice daily, HCTZ 50 mg daily, and valsartan 320 mg daily. Electrolytes and creatinine today.  2.  History of CVA, LDL goal less than 70. Unfortunately, patient not fasting when he comes in so no lipid testing has been done in the last year. Continue atorvastatin 40 mg a day. Continue aspirin 81 mg a day.  #3 lower extremity edema.  Venous insufficiency.  Stable. Limit sodium. No diuretic.  #4 history of iron deficiency anemia. Monitor CBC today.  An After Visit Summary was printed and given to t he patient.  FOLLOW UP: Return in about 3 months (around 12/16/2022) for routine chronic illness f/u.  Signed:  Crissie Sickles, MD           09/16/2022

## 2022-09-17 LAB — COMPREHENSIVE METABOLIC PANEL
ALT: 14 U/L (ref 0–53)
AST: 16 U/L (ref 0–37)
Albumin: 3.9 g/dL (ref 3.5–5.2)
Alkaline Phosphatase: 87 U/L (ref 39–117)
BUN: 28 mg/dL — ABNORMAL HIGH (ref 6–23)
CO2: 31 mEq/L (ref 19–32)
Calcium: 9.1 mg/dL (ref 8.4–10.5)
Chloride: 102 mEq/L (ref 96–112)
Creatinine, Ser: 0.97 mg/dL (ref 0.40–1.50)
GFR: 67.47 mL/min (ref >60.00)
Glucose, Bld: 102 mg/dL — ABNORMAL HIGH (ref 70–99)
Potassium: 4.1 mEq/L (ref 3.5–5.1)
Sodium: 140 mEq/L (ref 135–145)
Total Bilirubin: 0.4 mg/dL (ref 0.2–1.2)
Total Protein: 6.4 g/dL (ref 6.0–8.3)

## 2022-09-17 LAB — CBC
HCT: 37.5 % — ABNORMAL LOW (ref 39.0–52.0)
Hemoglobin: 12.7 g/dL — ABNORMAL LOW (ref 13.0–17.0)
MCHC: 33.8 g/dL (ref 30.0–36.0)
MCV: 98 fl (ref 78.0–100.0)
Platelets: 342 10*3/uL (ref 150.0–400.0)
RBC: 3.83 Mil/uL — ABNORMAL LOW (ref 4.22–5.81)
RDW: 15 % (ref 11.5–15.5)
WBC: 6.7 10*3/uL (ref 4.0–10.5)

## 2022-09-19 DIAGNOSIS — I10 Essential (primary) hypertension: Secondary | ICD-10-CM | POA: Diagnosis not present

## 2022-09-19 DIAGNOSIS — E559 Vitamin D deficiency, unspecified: Secondary | ICD-10-CM | POA: Diagnosis not present

## 2022-09-19 DIAGNOSIS — K219 Gastro-esophageal reflux disease without esophagitis: Secondary | ICD-10-CM | POA: Diagnosis not present

## 2022-09-20 DIAGNOSIS — F5101 Primary insomnia: Secondary | ICD-10-CM | POA: Diagnosis not present

## 2022-09-25 DIAGNOSIS — M6259 Muscle wasting and atrophy, not elsewhere classified, multiple sites: Secondary | ICD-10-CM | POA: Diagnosis not present

## 2022-09-25 DIAGNOSIS — R2681 Unsteadiness on feet: Secondary | ICD-10-CM | POA: Diagnosis not present

## 2022-09-25 DIAGNOSIS — M6281 Muscle weakness (generalized): Secondary | ICD-10-CM | POA: Diagnosis not present

## 2022-09-29 DIAGNOSIS — M6259 Muscle wasting and atrophy, not elsewhere classified, multiple sites: Secondary | ICD-10-CM | POA: Diagnosis not present

## 2022-09-29 DIAGNOSIS — M6281 Muscle weakness (generalized): Secondary | ICD-10-CM | POA: Diagnosis not present

## 2022-12-16 ENCOUNTER — Ambulatory Visit: Payer: Medicare Other | Admitting: Family Medicine

## 2022-12-16 NOTE — Progress Notes (Deleted)
OFFICE VISIT  12/16/2022  CC: No chief complaint on file.   Patient is a 89 y.o. male who presents for 61-month follow-up hypertension, history of CVA, lower extremity edema, and history of iron deficiency anemia. A/P as of last visit: "#1 hypertension, well-controlled on clonidine 0.1 mg twice daily, HCTZ 50 mg daily, and valsartan 320 mg daily. Electrolytes and creatinine today.   2.  History of CVA, LDL goal less than 70. Unfortunately, patient not fasting when he comes in so no lipid testing has been done in the last year. Continue atorvastatin 40 mg a day. Continue aspirin 81 mg a day.   #3 lower extremity edema.  Venous insufficiency.  Stable. Limit sodium. No diuretic.   #4 history of iron deficiency anemia. Monitor CBC today."  INTERIM HX: ***   Past Medical History:  Diagnosis Date   Ataxia 2014/15   brain MRI 02/2014 showed encephalomalacia parietal and occip lobes c/w old infarcts, plus diffuse cerebral atrophy, o/w normal   Cataract    Chronic renal insufficiency, stage 2 (mild) 12/2016   GFR 60s-70s   CVA (cerebral vascular accident) ([place]) 07/2021   R MCA territory. No obstruction/stenosis on CTA head and neck.  No a-fib in hosp.  Echo with low-normal LV fxn.  Outpt rhythm monitoring planned.  ASA + plavix x 3wks, then ASA alone.   DDD (degenerative disc disease)    L-spine   Dermatitis    ?Contact derm? per [facility] MD: Dr. Threasa Alpha (05/09/16): punch bx 06/18/16: chronic eczematous derm/contact derm, neg fungal stain.  Allergy (RAST) testing NEG.   GERD (gastroesophageal reflux disease)    Heme positive stool 02/2012   colonoscopy normal.  EGD showed small hiatal hernia with associated Cameron's erosions.  Hemoccults NEG x 3 09/2014. Hem + iron def 07/2019->conservative mgmt.   Hyperlipidemia    Hypertension    Insomnia    poor sleep hygiene.  Clonidine for his HTN has helped a little.   Iron deficiency anemia 02/2012; 07/2019   2013 Mild iron  deficiency->small upper GI erosions, colonoscopy normal.  07/2019->Hb 7.5, heme+, GI eval->pt chose no endoscopy->IV iron and Hb monitoring, PO iron indefinitely: Hb/iron normal on recheck 10/2019 and 12/2019.   Macular degeneration, age related    exudative R eye; nonexudative L eye.  Vitreous degeneration bilat.   Obesity (BMI 30-39.9)    Osteoarthritis, multiple sites    Retinal hemorrhage 2016   w/ retinal detachment (right eye) : anticoagulants stopped due to this   Venous insufficiency of both lower extremities     Past Surgical History:  Procedure Laterality Date   APPENDECTOMY  09/30/1933   cataract surg     Bilateral/ 08-13   COLONOSCOPY  05/12/2012   for iron def anemia--NORMAL RESULT   ESOPHAGOGASTRODUODENOSCOPY  06/17/2012   Small hiatal hernia with associated Cameron's erosions--likely source of his mild iron def anemia.   NASAL SINUS SURGERY     1998   TONSILLECTOMY  09/30/1933   TRANSTHORACIC ECHOCARDIOGRAM  08/11/2021   2022->(in setting of acute CVA) EF 50-55%, no thrombus, WMA c/w ischemic CM vs stress-induced CM.  Valves ok.    Outpatient Medications Prior to Visit  Medication Sig Dispense Refill   Aflibercept (EYLEA IO) Inject into the eye. One injection into right eye every 10 weeks     aspirin EC 81 MG tablet Take 81 mg by mouth daily.     atorvastatin (LIPITOR) 40 MG tablet Take 1 tablet (40 mg total) by mouth daily.  90 tablet 3   Cholecalciferol (VITAMIN D3) 2000 UNITS TABS Take 1 tablet by mouth daily.     cloNIDine (CATAPRES) 0.1 MG tablet Take 1 tablet (0.1 mg total) by mouth 2 (two) times daily. 180 tablet 3   hydrochlorothiazide (HYDRODIURIL) 50 MG tablet Take 1 tablet (50 mg total) by mouth daily. 90 tablet 3   Multiple Vitamins-Minerals (PRESERVISION AREDS 2 PO) Take 1 tablet by mouth daily.     omeprazole (PRILOSEC) 40 MG capsule Take 1 capsule (40 mg total) by mouth daily. TAKE 1 CAPSULE BY MOUTH EVERY DAY 90 capsule 3   valsartan (DIOVAN) 320 MG  tablet Take 1 tablet (320 mg total) by mouth daily. 90 tablet 3   No facility-administered medications prior to visit.    No Known Allergies  Review of Systems As per HPI  PE:    09/16/2022    2:24 PM 06/05/2022    1:19 PM 05/01/2022    4:58 PM  Vitals with BMI  Height 5\' 10"  5\' 10"    Weight 199 lbs 196 lbs 3 oz   BMI 123XX123 [id number]   Systolic [id number]  Diastolic 70 63 93  Pulse 57 62 61     Physical Exam  ***  LABS:  Last CBC Lab Results  Component Value Date   WBC 6.7 09/16/2022   HGB 12.7 (L) 09/16/2022   HCT 37.5 (L) 09/16/2022   MCV 98.0 09/16/2022   MCH 32.6 08/15/2021   RDW 15.0 09/16/2022   PLT 342.0 09/16/2022   Lab Results  Component Value Date   IRON 104 07/26/2021   TIBC 318 07/26/2021   FERRITIN 64 [id number]   Last metabolic panel Lab Results  Component Value Date   GLUCOSE 102 (H) 09/16/2022   NA 140 09/16/2022   K 4.1 09/16/2022   CL 102 09/16/2022   CO2 31 09/16/2022   BUN 28 (H) 09/16/2022   CREATININE 0.97 09/16/2022   GFRNONAA >60 08/15/2021   CALCIUM 9.1 09/16/2022   PROT 6.4 09/16/2022   ALBUMIN 3.9 09/16/2022   BILITOT 0.4 09/16/2022   ALKPHOS 87 09/16/2022   AST 16 09/16/2022   ALT 14 09/16/2022   ANIONGAP 7 08/15/2021   Last lipids Lab Results  Component Value Date   CHOL 243 (H) 08/11/2021   HDL 68 08/11/2021   LDLCALC 159 (H) 08/11/2021   LDLDIRECT 112.5 09/11/2011   TRIG 81 08/11/2021   CHOLHDL 3.6 08/11/2021   Last hemoglobin A1c Lab Results  Component Value Date   HGBA1C 5.8 (H) 08/11/2021   Last thyroid functions Lab Results  Component Value Date   TSH 3.260 08/11/2021   IMPRESSION AND PLAN:  No problem-specific Assessment & Plan notes found for this encounter.   An After Visit Summary was printed and given to the patient.  FOLLOW UP: No follow-ups on file.  Signed:  Crissie Sickles, MD           12/16/2022

## 2023-01-01 ENCOUNTER — Encounter: Payer: Self-pay | Admitting: Family Medicine

## 2023-01-01 ENCOUNTER — Ambulatory Visit (INDEPENDENT_AMBULATORY_CARE_PROVIDER_SITE_OTHER): Payer: Medicare Other | Admitting: Family Medicine

## 2023-01-01 VITALS — BP 138/82 | HR 72 | Wt 188.0 lb

## 2023-01-01 DIAGNOSIS — Z862 Personal history of diseases of the blood and blood-forming organs and certain disorders involving the immune mechanism: Secondary | ICD-10-CM | POA: Diagnosis not present

## 2023-01-01 DIAGNOSIS — R600 Localized edema: Secondary | ICD-10-CM | POA: Diagnosis not present

## 2023-01-01 DIAGNOSIS — I10 Essential (primary) hypertension: Secondary | ICD-10-CM | POA: Diagnosis not present

## 2023-01-01 DIAGNOSIS — I6930 Unspecified sequelae of cerebral infarction: Secondary | ICD-10-CM

## 2023-01-01 LAB — CBC
HCT: 37.1 % — ABNORMAL LOW (ref 39.0–52.0)
Hemoglobin: 12.6 g/dL — ABNORMAL LOW (ref 13.0–17.0)
MCHC: 33.9 g/dL (ref 30.0–36.0)
MCV: 96.1 fl (ref 78.0–100.0)
Platelets: 322 10*3/uL (ref 150.0–400.0)
RBC: 3.86 Mil/uL — ABNORMAL LOW (ref 4.22–5.81)
RDW: 14.6 % (ref 11.5–15.5)
WBC: 6.6 10*3/uL (ref 4.0–10.5)

## 2023-01-01 LAB — BASIC METABOLIC PANEL
BUN: 24 mg/dL — ABNORMAL HIGH (ref 6–23)
CO2: 30 mEq/L (ref 19–32)
Calcium: 9.1 mg/dL (ref 8.4–10.5)
Chloride: 103 mEq/L (ref 96–112)
Creatinine, Ser: 0.99 mg/dL (ref 0.40–1.50)
GFR: 65.71 mL/min (ref >60.00)
Glucose, Bld: 111 mg/dL — ABNORMAL HIGH (ref 70–99)
Potassium: 3.8 mEq/L (ref 3.5–5.1)
Sodium: 141 mEq/L (ref 135–145)

## 2023-01-01 NOTE — Progress Notes (Signed)
OFFICE VISIT  01/01/2023  CC:  Chief Complaint  Patient presents with   Follow-up    3 month follow up. Wants you to check a cut on his ear.    Patient is a 89 y.o. male who presents for 73-month follow-up hypertension, history of CVA, chronic lower extremity edema, and history of iron deficiency anemia. A/P as of last visit: "#1 hypertension, well-controlled on clonidine 0.1 mg twice daily, HCTZ 50 mg daily, and valsartan 320 mg daily. Electrolytes and creatinine today.   2.  History of CVA, LDL goal less than 70. Unfortunately, patient not fasting when he comes in so no lipid testing has been done in the last year. Continue atorvastatin 40 mg a day. Continue aspirin 81 mg a day.   #3 lower extremity edema.  Venous insufficiency.  Stable. Limit sodium. No diuretic.   #4 history of iron deficiency anemia. Monitor CBC today."  INTERIM HX: He is doing well. No falls.  Ambulates with 4-point cane, occasionally a walker. Is food intake varies depending on what his nursing home has to eat.  He says his appetite is fine. Has some periods of fatigue that wax and wane.  ROS as above, plus--> no fevers, no CP, no SOB, no wheezing, no cough, no dizziness, no HAs, no rashes, no melena/hematochezia.  No polyuria or polydipsia.  No myalgias or arthralgias.  No focal weakness, paresthesias, or tremors.  No acute vision or hearing abnormalities.  No dysuria or unusual/new urinary urgency or frequency.  No recent changes in lower legs. No n/v/d or abd pain.  No palpitations.      Past Medical History:  Diagnosis Date   Ataxia 2014/15   brain MRI 02/2014 showed encephalomalacia parietal and occip lobes c/w old infarcts, plus diffuse cerebral atrophy, o/w normal   Cataract    Chronic renal insufficiency, stage 2 (mild) 12/2016   GFR 60s-70s   CVA (cerebral vascular accident) 07/2021   R MCA territory. No obstruction/stenosis on CTA head and neck.  No a-fib in hosp.  Echo with low-normal LV  fxn.  Outpt rhythm monitoring planned.  ASA + plavix x 3wks, then ASA alone.   DDD (degenerative disc disease)    L-spine   Dermatitis    ?Contact derm? per [facility] MD: Dr. Threasa Alpha (05/09/16): punch bx 06/18/16: chronic eczematous derm/contact derm, neg fungal stain.  Allergy (RAST) testing NEG.   GERD (gastroesophageal reflux disease)    Heme positive stool 02/2012   colonoscopy normal.  EGD showed small hiatal hernia with associated Cameron's erosions.  Hemoccults NEG x 3 09/2014. Hem + iron def 07/2019->conservative mgmt.   Hyperlipidemia    Hypertension    Insomnia    poor sleep hygiene.  Clonidine for his HTN has helped a little.   Iron deficiency anemia 02/2012; 07/2019   2013 Mild iron deficiency->small upper GI erosions, colonoscopy normal.  07/2019->Hb 7.5, heme+, GI eval->pt chose no endoscopy->IV iron and Hb monitoring, PO iron indefinitely: Hb/iron normal on recheck 10/2019 and 12/2019.   Macular degeneration, age related    exudative R eye; nonexudative L eye.  Vitreous degeneration bilat.   Obesity (BMI 30-39.9)    Osteoarthritis, multiple sites    Retinal hemorrhage 2016   w/ retinal detachment (right eye) : anticoagulants stopped due to this   Venous insufficiency of both lower extremities     Past Surgical History:  Procedure Laterality Date   APPENDECTOMY  09/30/1933   cataract surg     Bilateral/ 08-13  COLONOSCOPY  05/12/2012   for iron def anemia--NORMAL RESULT   ESOPHAGOGASTRODUODENOSCOPY  06/17/2012   Small hiatal hernia with associated Cameron's erosions--likely source of his mild iron def anemia.   NASAL SINUS SURGERY     1998   TONSILLECTOMY  09/30/1933   TRANSTHORACIC ECHOCARDIOGRAM  08/11/2021   2022->(in setting of acute CVA) EF 50-55%, no thrombus, WMA c/w ischemic CM vs stress-induced CM.  Valves ok.    Outpatient Medications Prior to Visit  Medication Sig Dispense Refill   Aflibercept (EYLEA IO) Inject into the eye. One injection into  right eye every 10 weeks     aspirin EC 81 MG tablet Take 81 mg by mouth daily.     atorvastatin (LIPITOR) 40 MG tablet Take 1 tablet (40 mg total) by mouth daily. 90 tablet 3   Cholecalciferol (VITAMIN D3) 2000 UNITS TABS Take 1 tablet by mouth daily.     cloNIDine (CATAPRES) 0.1 MG tablet Take 1 tablet (0.1 mg total) by mouth 2 (two) times daily. 180 tablet 3   hydrochlorothiazide (HYDRODIURIL) 50 MG tablet Take 1 tablet (50 mg total) by mouth daily. 90 tablet 3   Multiple Vitamins-Minerals (PRESERVISION AREDS 2 PO) Take 1 tablet by mouth daily.     omeprazole (PRILOSEC) 40 MG capsule Take 1 capsule (40 mg total) by mouth daily. TAKE 1 CAPSULE BY MOUTH EVERY DAY 90 capsule 3   valsartan (DIOVAN) 320 MG tablet Take 1 tablet (320 mg total) by mouth daily. 90 tablet 3   No facility-administered medications prior to visit.    No Known Allergies  Review of Systems As per HPI  PE:    01/01/2023    1:05 PM 09/16/2022    2:24 PM 06/05/2022    1:19 PM  Vitals with BMI  Height  5\' 10"  5\' 10"   Weight 188 lbs 199 lbs 196 lbs 3 oz  BMI  123XX123 A999333  Systolic 0000000 [id number]  Diastolic 82 70 63  Pulse 72 57 62     Physical Exam  Gen: Alert, well appearing.  Patient is oriented to person, place, time, and situation. AFFECT: pleasant, lucid thought and speech. CV: RRR, soft systolic murmur Lungs are clear bilaterally. No significant pitting edema on either side.  No erythema, tenderness, or skin breakdown.  LABS:  Last CBC Lab Results  Component Value Date   WBC 6.7 09/16/2022   HGB 12.7 (L) 09/16/2022   HCT 37.5 (L) 09/16/2022   MCV 98.0 09/16/2022   MCH 32.6 08/15/2021   RDW 15.0 09/16/2022   PLT 342.0 09/16/2022   Lab Results  Component Value Date   IRON 104 07/26/2021   TIBC 318 07/26/2021   FERRITIN 64 [id number]   Last metabolic panel Lab Results  Component Value Date   GLUCOSE 102 (H) 09/16/2022   NA 140 09/16/2022   K 4.1 09/16/2022   CL 102 09/16/2022   CO2 31  09/16/2022   BUN 28 (H) 09/16/2022   CREATININE 0.97 09/16/2022   GFRNONAA >60 08/15/2021   CALCIUM 9.1 09/16/2022   PROT 6.4 09/16/2022   ALBUMIN 3.9 09/16/2022   BILITOT 0.4 09/16/2022   ALKPHOS 87 09/16/2022   AST 16 09/16/2022   ALT 14 09/16/2022   ANIONGAP 7 08/15/2021   Last lipids Lab Results  Component Value Date   CHOL 243 (H) 08/11/2021   HDL 68 08/11/2021   LDLCALC 159 (H) 08/11/2021   LDLDIRECT 112.5 09/11/2011   TRIG 81 08/11/2021   CHOLHDL 3.6 08/11/2021  Last hemoglobin A1c Lab Results  Component Value Date   HGBA1C 5.8 (H) 08/11/2021   Last thyroid functions Lab Results  Component Value Date   TSH 3.260 08/11/2021   IMPRESSION AND PLAN:  #1 hypertension, well-controlled on clonidine 0.1 mg twice a day, HCTZ 50 mg a day, and valsartan 320 mg a day. Electrolytes and creatinine today.  2.  Hyperlipidemia, doing well on a atorvastatin 40 mg a day. Lipid panel today.  3.  History of CVA with residual deficit.  He has just a subtle amount of decreased movement of the lower aspect of face on the left side--> stable.  No dysarthria.   Continue aspirin, statin, and blood pressure control.  4. lower extremity edema.  Venous insufficiency.  Stable/no pitting at this time. Limit sodium. No diuretic.   5. history of iron deficiency anemia. Monitor CBC today. Iron supplement.  An After Visit Summary was printed and given to the patient.  FOLLOW UP: Return in about 3 months (around 04/02/2023) for routine chronic illness f/u.  Signed:  Crissie Sickles, MD           01/01/2023

## 2023-04-02 ENCOUNTER — Ambulatory Visit: Payer: Medicare Other | Admitting: Family Medicine

## 2023-04-10 ENCOUNTER — Ambulatory Visit: Payer: Medicare Other | Admitting: Family Medicine

## 2023-06-10 IMAGING — MR MR HEAD W/O CM
12 of 13 series · 44 of 48 positions shown · non-contrast
Comparison: Head CT from yesterday

CLINICAL DATA: Neuro deficit with acute stroke suspected.

EXAM:
MRI HEAD WITHOUT CONTRAST
TECHNIQUE: Multiplanar, multiecho pulse sequences of the brain and surrounding
structures were obtained without intravenous contrast.

[Series 5: DWI · axial · 3.0mm · 0.88mm/px · z∈[-70,+77]mm · 8 of 100 slices shown (1 of 4)]
[im 1/100]
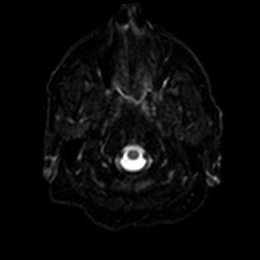
[im 15/100]
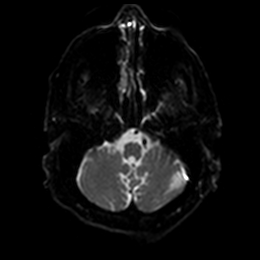
[im 29/100]
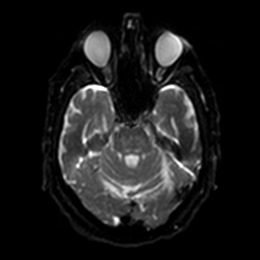
[im 43/100]
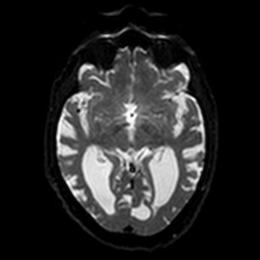
[im 57/100]
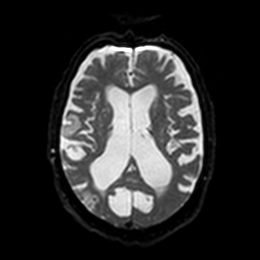
[im 71/100]
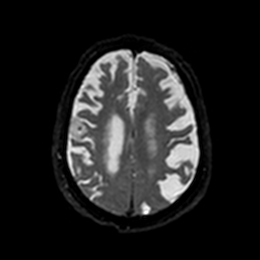
[im 85/100]
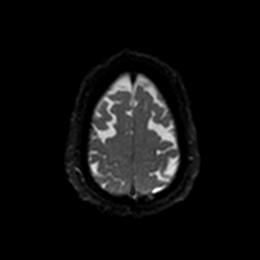
[im 100/100]
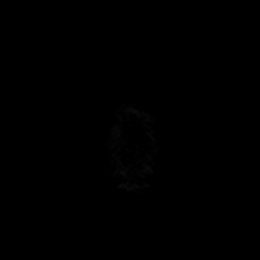

[Series 6: DWI · axial · 3.0mm · 0.88mm/px · z∈[-70,+77]mm · 4 of 50 slices shown (2 of 4)]
[im 1/50]
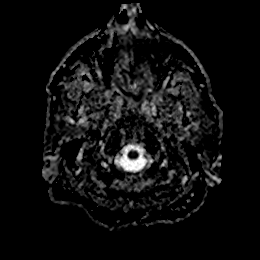
[im 17/50]
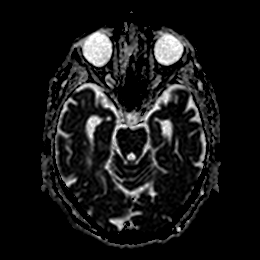
[im 33/50]
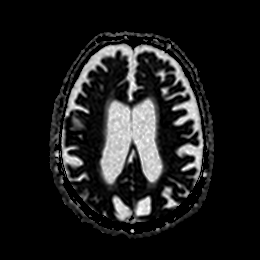
[im 50/50]
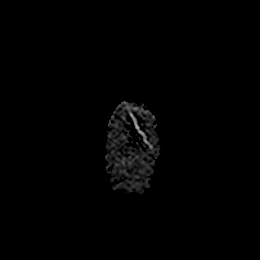

[Series 7: DWI · coronal · 4.0mm · 0.88mm/px · 5 of 68 slices shown (3 of 4)]
[im 1/68]
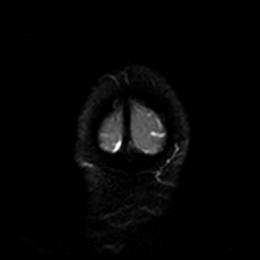
[im 17/68]
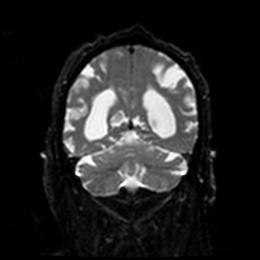
[im 34/68]
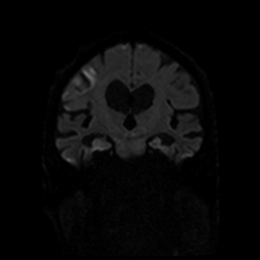
[im 51/68]
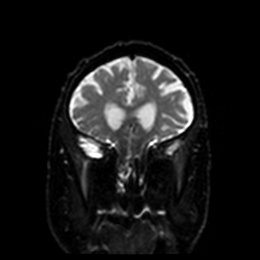
[im 68/68]
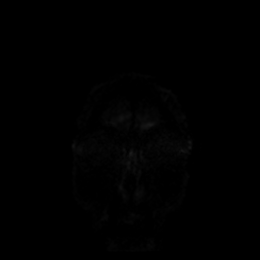

[Series 8: DWI · coronal · 4.0mm · 0.88mm/px · 3 of 34 slices shown (4 of 4)]
[im 1/34]
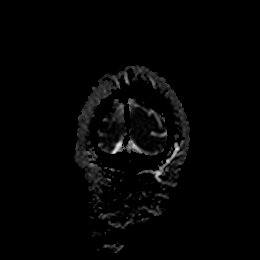
[im 17/34]
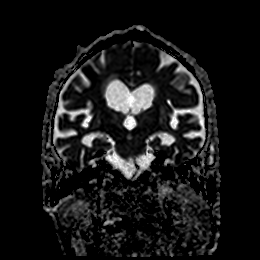
[im 34/34]
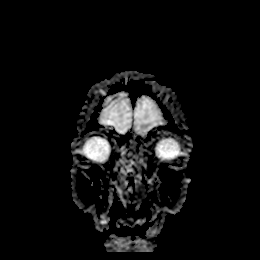

[Series 9: T1 · sagittal · 5.0mm · 0.75mm/px · 2 of 24 slices shown]
[im 1/24]
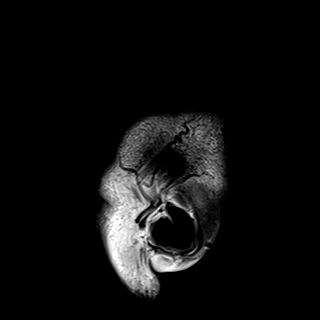
[im 24/24]
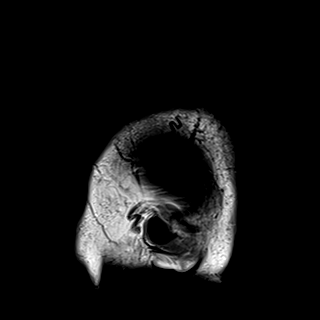

[Series 10: T2 · axial · 5.0mm · 0.72mm/px · z∈[-69,+75]mm · 2 of 25 slices shown (1 of 2)]
[im 1/25]
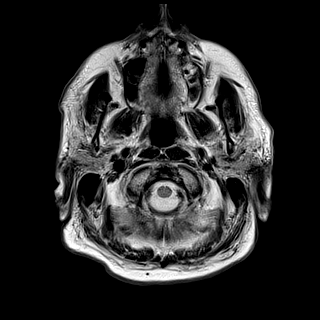
[im 25/25]
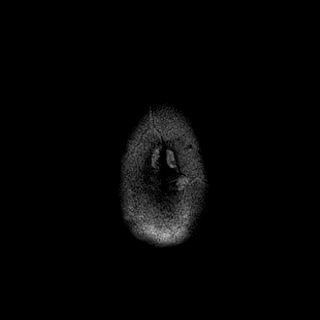

[Series 11: FLAIR · axial · 5.0mm · 0.45mm/px · z∈[-70,+74]mm · 2 of 25 slices shown]
[im 1/25]
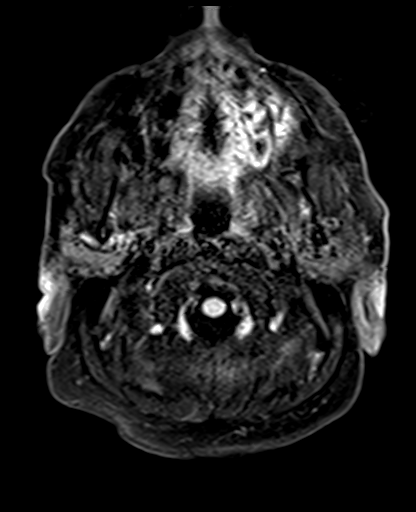
[im 25/25]
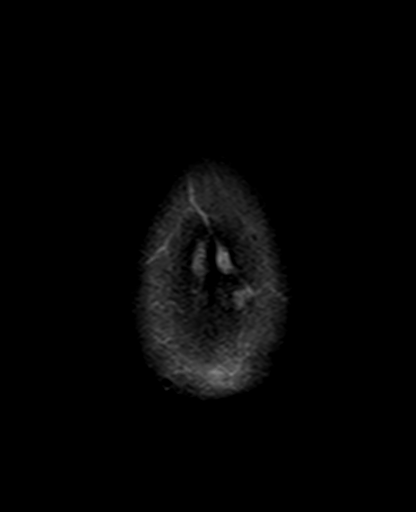

[Series 12: mag_images · axial · 3.0mm · 0.90mm/px · z∈[-76,+77]mm · 4 of 52 slices shown]
[im 1/52]
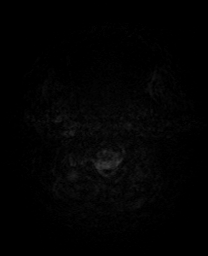
[im 18/52]
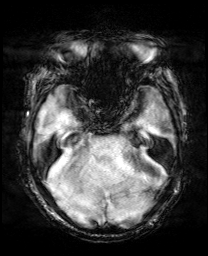
[im 35/52]
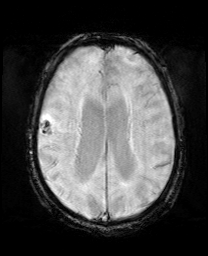
[im 52/52]
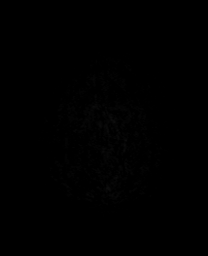

[Series 13: pha_images · axial · 3.0mm · 0.90mm/px · z∈[-76,+74]mm · 4 of 51 slices shown]
[im 1/51]
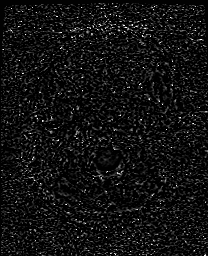
[im 17/51]
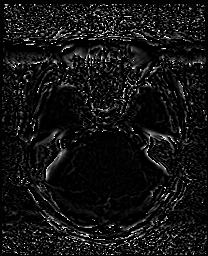
[im 34/51]
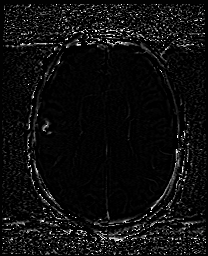
[im 51/51]
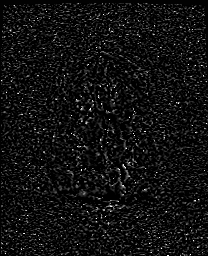

[Series 14: swi_images · axial · 3.0mm · 0.90mm/px · z∈[-76,+77]mm · 4 of 52 slices shown]
[im 1/52]
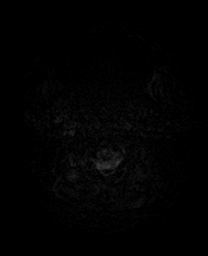
[im 18/52]
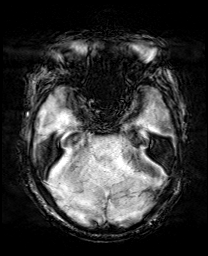
[im 35/52]
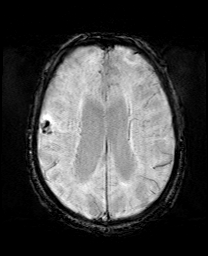
[im 52/52]
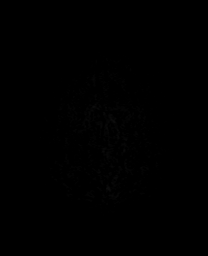

[Series 15: mip_images(sw) · axial · 24.0mm · 0.90mm/px · z∈[-66,+66]mm · 4 of 45 slices shown]
[im 1/45]
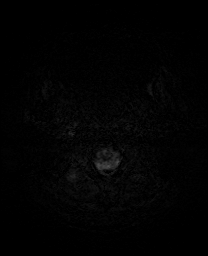
[im 15/45]
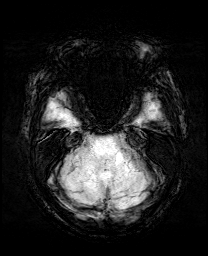
[im 30/45]
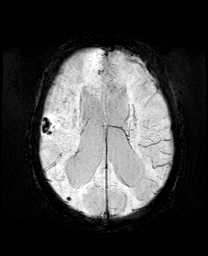
[im 45/45]
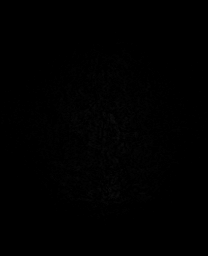

[Series 17: T2 · coronal · 5.0mm · 0.34mm/px · 2 of 29 slices shown (2 of 2)]
[im 1/29]
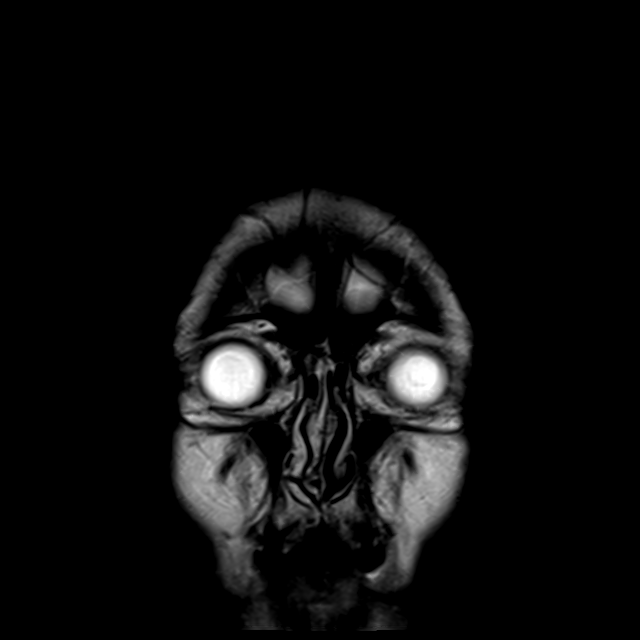
[im 29/29]
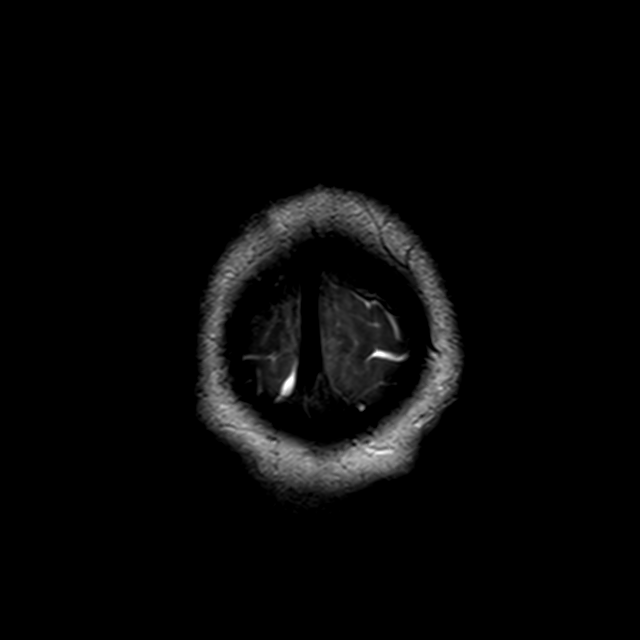

[44 of 48 positions shown; findings below may reference images not displayed]

FINDINGS: Brain: Acute cortically based infarcts in the right posterior
frontal and separately in the right parietal lobes, both MCA
territory. No hemorrhage, hydrocephalus, or masslike finding.
Generalized brain atrophy with variable sulcal widening of
indeterminate cause. Chronic small vessel ischemia in the
hemispheric white matter and pons, mild for age.

Vascular: Major flow voids are preserved

Skull and upper cervical spine: Negative

Sinuses/Orbits: Bilateral cataract resection and staphyloma.
IMPRESSION: 1. Acute right MCA branch infarcts affecting frontal and parietal
cortex.
2. Significant brain atrophy.

## 2023-06-12 ENCOUNTER — Ambulatory Visit (INDEPENDENT_AMBULATORY_CARE_PROVIDER_SITE_OTHER): Payer: Medicare Other

## 2023-06-12 VITALS — Wt 188.0 lb

## 2023-06-12 DIAGNOSIS — Z0000 Encounter for general adult medical examination without abnormal findings: Secondary | ICD-10-CM | POA: Diagnosis not present

## 2023-06-12 NOTE — Patient Instructions (Signed)
Mr. Glen Barnett , Thank you for taking time to come for your Medicare Wellness Visit. I appreciate your ongoing commitment to your health goals. Please review the following plan we discussed and let me know if I can assist you in the future.   Referrals/Orders/Follow-Ups/Clinician Recommendations: maintain health   This is a list of the screening recommended for you and due dates:  Health Maintenance  Topic Date Due   DTaP/Tdap/Td vaccine (2 - Tdap) 09/30/2017   COVID-19 Vaccine (2 - [facility]sen risk series) 02/24/2020   Flu Shot  05/01/2023   Medicare Annual Wellness Visit  06/11/2024   Pneumonia Vaccine  Completed   Zoster (Shingles) Vaccine  Completed   HPV Vaccine  Aged Out    Advanced directives: (Copy Requested) Please bring a copy of your health care power of attorney and living will to the office to be added to your chart at your convenience.  Next Medicare Annual Wellness Visit scheduled for next year: Yes

## 2023-06-12 NOTE — Progress Notes (Addendum)
Subjective:   [name] is a 89 y.o. male who presents for Medicare Annual/Subsequent preventive examination. Along with son [name] to assist   Visit Complete: Virtual  I connected with  Glen Barnett on 06/12/23 by a audio enabled telemedicine application and verified that I am speaking with the correct person using two identifiers.  Patient Location: Home  Provider Location: Office/Clinic  I discussed the limitations of evaluation and management by telemedicine. The patient expressed understanding and agreed to proceed.   Review of Systems     Cardiac Risk Factors include: advanced age (>39men, >63 women)     Objective:    Today's Vitals   06/12/23 1301  Weight: 188 lb (85.3 kg)   Body mass index is 26.98 kg/m.     06/12/2023    1:09 PM 05/01/2022    2:41 PM 06/29/2021    6:34 PM 01/30/2018   11:27 AM 01/07/2017   12:36 PM  Advanced Directives  Does Patient Have a Medical Advance Directive? Yes Yes Yes Yes Yes  Type of Estate agent of [place];Living will   Healthcare Power of [place];Living will Living will;Healthcare Power of Attorney  Does patient want to make changes to medical advance directive?  No - Patient declined No - Patient declined    Copy of Healthcare Power of Attorney in Chart? No - copy requested   Yes No - copy requested    Current Medications (verified) Outpatient Encounter Medications as of 06/12/2023  Medication Sig   Aflibercept (EYLEA IO) Inject into the eye. One injection into right eye every 10 weeks   aspirin EC 81 MG tablet Take 81 mg by mouth daily.   atorvastatin (LIPITOR) 40 MG tablet Take 1 tablet (40 mg total) by mouth daily.   Cholecalciferol (VITAMIN D3) 2000 UNITS TABS Take 1 tablet by mouth daily.   cloNIDine (CATAPRES) 0.1 MG tablet Take 1 tablet (0.1 mg total) by mouth 2 (two) times daily.   hydrochlorothiazide (HYDRODIURIL) 50 MG tablet Take 1 tablet (50 mg total) by mouth daily.    Multiple Vitamins-Minerals (PRESERVISION AREDS 2 PO) Take 1 tablet by mouth daily.   omeprazole (PRILOSEC) 40 MG capsule Take 1 capsule (40 mg total) by mouth daily. TAKE 1 CAPSULE BY MOUTH EVERY DAY   valsartan (DIOVAN) 320 MG tablet Take 1 tablet (320 mg total) by mouth daily.   No facility-administered encounter medications on file as of 06/12/2023.    Allergies (verified) Patient has no known allergies.   History: Past Medical History:  Diagnosis Date   Ataxia 2014/15   brain MRI 02/2014 showed encephalomalacia parietal and occip lobes c/w old infarcts, plus diffuse cerebral atrophy, o/w normal   Cataract    Chronic renal insufficiency, stage 2 (mild) 12/2016   GFR 60s-70s   CVA (cerebral vascular accident) (HCC) 07/2021   R MCA territory. No obstruction/stenosis on CTA head and neck.  No a-fib in hosp.  Echo with low-normal LV fxn.  Outpt rhythm monitoring planned.  ASA + plavix x 3wks, then ASA alone.   DDD (degenerative disc disease)    L-spine   Dermatitis    ?Contact derm? per [facility] Pick Derm MD: Dr. Samule Ohm (05/09/16): punch bx 06/18/16: chronic eczematous derm/contact derm, neg fungal stain.  Allergy (RAST) testing NEG.   GERD (gastroesophageal reflux disease)    Heme positive stool 02/2012   colonoscopy normal.  EGD showed small hiatal hernia with associated Cameron's erosions.  Hemoccults NEG x 3 09/2014.  Hem + iron def 07/2019->conservative mgmt.   Hyperlipidemia    Hypertension    Insomnia    poor sleep hygiene.  Clonidine for his HTN has helped a little.   Iron deficiency anemia 02/2012; 07/2019   2013 Mild iron deficiency->small upper GI erosions, colonoscopy normal.  07/2019->Hb 7.5, heme+, GI eval->pt chose no endoscopy->IV iron and Hb monitoring, PO iron indefinitely: Hb/iron normal on recheck 10/2019 and 12/2019.   Macular degeneration, age related    exudative R eye; nonexudative L eye.  Vitreous degeneration bilat.   Obesity (BMI 30-39.9)    Osteoarthritis,  multiple sites    Retinal hemorrhage 2016   w/ retinal detachment (right eye) : anticoagulants stopped due to this   Venous insufficiency of both lower extremities    Past Surgical History:  Procedure Laterality Date   APPENDECTOMY  09/30/1933   cataract surg     Bilateral/ 08-13   COLONOSCOPY  05/12/2012   for iron def anemia--NORMAL RESULT   ESOPHAGOGASTRODUODENOSCOPY  06/17/2012   Small hiatal hernia with associated Cameron's erosions--likely source of his mild iron def anemia.   NASAL SINUS SURGERY     1998   TONSILLECTOMY  09/30/1933   TRANSTHORACIC ECHOCARDIOGRAM  08/11/2021   2022->(in setting of acute CVA) EF 50-55%, no thrombus, WMA c/w ischemic CM vs stress-induced CM.  Valves ok.   Family History  Problem Relation Age of Onset   Liver cancer Mother    Social History   Socioeconomic History   Marital status: Widowed    Spouse name: Not on file   Number of children: Not on file   Years of education: Not on file   Highest education level: Not on file  Occupational History   Not on file  Tobacco Use   Smoking status: Never   Smokeless tobacco: Never  Vaping Use   Vaping status: Never Used  Substance and Sexual Activity   Alcohol use: No   Drug use: No   Sexual activity: Not Currently  Other Topics Concern   Not on file  Social History Narrative   Widower.  Wife died Aug 24, 2011 of brain cancer.   He relocated from [place] 2010 to live near son.   NO exercise.  No T/A/Ds.   Retired Art gallery manager.   Social Determinants of Health   Financial Resource Strain: Low Risk  (06/12/2023)   Overall Financial Resource Strain (CARDIA)    Difficulty of Paying Living Expenses: Not hard at all  Food Insecurity: No Food Insecurity (06/12/2023)   Hunger Vital Sign    Worried About Running Out of Food in the Last Year: Never true    Ran Out of Food in the Last Year: Never true  Transportation Needs: No Transportation Needs (06/12/2023)   PRAPARE - Scientist, research (physical sciences) (Medical): No    Lack of Transportation (Non-Medical): No  Physical Activity: Insufficiently Active (06/12/2023)   Exercise Vital Sign    Days of Exercise per Week: 7 days    Minutes of Exercise per Session: 10 min  Stress: No Stress Concern Present (06/12/2023)   [employer] of Occupational Health - Occupational Stress Questionnaire    Feeling of Stress : Not at all  Social Connections: Socially Isolated (06/12/2023)   Social Connection and Isolation Panel [NHANES]    Frequency of Communication with Friends and Family: Three times a week    Frequency of Social Gatherings with Friends and Family: More than three times a week    Attends Religious  Services: Never    Active Member of Clubs or Organizations: No    Attends Banker Meetings: Never    Marital Status: Widowed    Tobacco Counseling Counseling given: Not Answered   Clinical Intake:  Pre-visit preparation completed: Yes  Pain : No/denies pain     BMI - recorded: 26.98 Nutritional Status: BMI 25 -29 Overweight Nutritional Risks: None Diabetes: No  How often do you need to have someone help you when you read instructions, pamphlets, or other written materials from your doctor or pharmacy?: 1 - Never  Interpreter Needed?: No  Information entered by :: Lanier Ensign, LPN   Activities of Daily Living    06/12/2023    1:06 PM  In your present state of health, do you have any difficulty performing the following activities:  Hearing? 0  Vision? 0  Difficulty concentrating or making decisions? 0  Walking or climbing stairs? 1  Comment uses a walker  Dressing or bathing? 0  Doing errands, shopping? 0  Preparing Food and eating ? N  Using the Toilet? N  In the past six months, have you accidently leaked urine? N  Do you have problems with loss of bowel control? N  Managing your Medications? Y  Comment has assistance  Managing your Finances? Y  Comment pt has Psychologist, forensic? Y    Patient Care Team: Jeoffrey Massed, MD as PCP - General Rachael Fee, MD as Consulting Physician (Gastroenterology) Stephannie Li, MD as Consulting Physician (Ophthalmology) Edwyna Ready as Consulting Physician (Allergy and Immunology) Lucinda Dell, MD (Dermatology) Pill, Newton Pigg, MD (Orthopedic Surgery)  Indicate any recent Medical Services you may have received from other than [facility] providers in the past year (date may be approximate).     Assessment:   This is a routine wellness examination for Glen Barnett.  Hearing/Vision screen Hearing Screening - Comments:: Pt denies any hearing issues  Vision Screening - Comments:: Pt follows up with Dr Clearance Coots for annual eye exams    Goals Addressed             This Visit's Progress    Patient Stated       Maintain current health        Depression Screen    06/12/2023    1:09 PM 06/10/2022    1:49 PM 06/10/2022    1:41 PM 09/26/2021    1:54 PM 07/26/2021   11:05 AM 06/29/2021    6:29 PM 12/25/2020   10:37 AM  PHQ 2/9 Scores  PHQ - 2 Score 0 0 0 0 0 0 1    Fall Risk    06/12/2023    1:10 PM 06/10/2022    1:40 PM 07/26/2021   11:05 AM 06/29/2021    6:35 PM 12/25/2020   10:36 AM  Fall Risk   Falls in the past year?  1 0 0 0  Number falls in past yr: 1 0 0 0 0  Injury with Fall? 0 0 0 0 1  Risk for fall due to : History of fall(s);Impaired vision;Impaired balance/gait;Impaired mobility No Fall Risks  No Fall Risks   Follow up Falls prevention discussed Falls evaluation completed Falls evaluation completed Falls evaluation completed Falls evaluation completed    MEDICARE RISK AT HOME: Medicare Risk at Home Any stairs in or around the home?: No If so, are there any without handrails?: No Home free of loose throw rugs in walkways, pet beds, electrical cords,  etc?: Yes Adequate lighting in your home to reduce risk of falls?: Yes Life alert?: Yes Use of a cane,  walker or w/c?: Yes Grab bars in the bathroom?: Yes Shower chair or bench in shower?: Yes Elevated toilet seat or a handicapped toilet?: Yes  TIMED UP AND GO:  Was the test performed?  No    Cognitive Function:DECLINED     01/30/2018   11:30 AM 01/07/2017   12:38 PM  MMSE - Mini Mental State Exam  Orientation to time 5 5  Orientation to Place 5 5  Registration 3 3  Attention/ Calculation 5 5  Recall 1 2  Language- name 2 objects 2 2  Language- repeat 1 1  Language- follow 3 step command 3 3  Language- read & follow direction 1 1  Write a sentence 1 0  Copy design 1 1  Total score 28 28        06/10/2022    1:50 PM 06/29/2021    6:40 PM  6CIT Screen  What Year? 0 points 0 points  What month? 0 points 0 points  What time? 0 points 3 points  Count back from 20 0 points 0 points  Months in reverse  4 points  Repeat phrase 0 points 0 points  Total Score  7 points    Immunizations Immunization History  Administered Date(s) Administered   Fluad Quad(high Dose 65+) 06/13/2019, 07/26/2021, 06/05/2022   Influenza Split 07/04/2011, 08/11/2012, 08/21/2014   Influenza, High Dose Seasonal PF 06/18/2016, 06/30/2017, 06/30/2018   Influenza,inj,Quad PF,6+ Mos 06/07/2013   Influenza-Unspecified 06/01/2015, 06/30/2020   Janssen (J&J) SARS-COV-2 Vaccination 01/27/2020   Pneumococcal Conjugate-13 09/08/2014   Pneumococcal Polysaccharide-23 09/30/1996   Td 10/01/2007   Zoster Recombinant(Shingrix) 06/13/2019, 08/16/2019   Zoster, Live 10/01/2007    TDAP status: Due, Education has been provided regarding the importance of this vaccine. Advised may receive this vaccine at local pharmacy or Health Dept. Aware to provide a copy of the vaccination record if obtained from local pharmacy or Health Dept. Verbalized acceptance and understanding.  Flu Vaccine status: Declined, Education has been provided regarding the importance of this vaccine but patient still declined. Advised may receive  this vaccine at local pharmacy or Health Dept. Aware to provide a copy of the vaccination record if obtained from local pharmacy or Health Dept. Verbalized acceptance and understanding.  Pneumococcal vaccine status: Up to date  Covid-19 vaccine status: Declined, Education has been provided regarding the importance of this vaccine but patient still declined. Advised may receive this vaccine at local pharmacy or Health Dept.or vaccine clinic. Aware to provide a copy of the vaccination record if obtained from local pharmacy or Health Dept. Verbalized acceptance and understanding.  Qualifies for Shingles Vaccine? Yes   Zostavax completed Yes   Shingrix Completed?: Yes  Screening Tests Health Maintenance  Topic Date Due   DTaP/Tdap/Td (2 - Tdap) 09/30/2017   COVID-19 Vaccine (2 - Janssen risk series) 02/24/2020   INFLUENZA VACCINE  12/29/2023 (Originally 05/01/2023)   Medicare Annual Wellness (AWV)  06/11/2024   Pneumonia Vaccine 36+ Years old  Completed   Zoster Vaccines- Shingrix  Completed   HPV VACCINES  Aged Out    Health Maintenance  Health Maintenance Due  Topic Date Due   DTaP/Tdap/Td (2 - Tdap) 09/30/2017   COVID-19 Vaccine (2 - Janssen risk series) 02/24/2020    Colorectal cancer screening: No longer required.     Additional Screening:    Vision Screening: Recommended annual ophthalmology exams for  early detection of glaucoma and other disorders of the eye. Is the patient up to date with their annual eye exam?  Yes  Who is the provider or what is the name of the office in which the patient attends annual eye exams? Dr Clearance Coots  If pt is not established with a provider, would they like to be referred to a provider to establish care? No .   Dental Screening: Recommended annual dental exams for proper oral hygiene   Community Resource Referral / Chronic Care Management: CRR required this visit?  No   CCM required this visit?  No     Plan:     I have personally  reviewed and noted the following in the patient's chart:   Medical and social history Use of alcohol, tobacco or illicit drugs  Current medications and supplements including opioid prescriptions. Patient is not currently taking opioid prescriptions. Functional ability and status Nutritional status Physical activity Advanced directives List of other physicians Hospitalizations, surgeries, and ER visits in previous 12 months Vitals Screenings to include cognitive, depression, and falls Referrals and appointments  In addition, I have reviewed and discussed with patient certain preventive protocols, quality metrics, and best practice recommendations. A written personalized care plan for preventive services as well as general preventive health recommendations were provided to patient.     Marzella Schlein, LPN   1/61/0960   After Visit Summary: (Declined) Due to this being a telephonic visit, with patients personalized plan was offered to patient but patient Declined AVS at this time   Nurse Notes: Pt declined cognition at this time
